# Patient Record
Sex: Female | Born: 1955 | Race: White | Hispanic: No | Marital: Married | Smoking: Never smoker
Health system: Southern US, Community
[De-identification: ages and names within clinical notes are randomized; demographics above are authoritative.]

## PROBLEM LIST (undated history)

## (undated) DIAGNOSIS — F321 Major depressive disorder, single episode, moderate: Secondary | ICD-10-CM

## (undated) DIAGNOSIS — K219 Gastro-esophageal reflux disease without esophagitis: Secondary | ICD-10-CM

## (undated) DIAGNOSIS — E041 Nontoxic single thyroid nodule: Secondary | ICD-10-CM

## (undated) DIAGNOSIS — J4521 Mild intermittent asthma with (acute) exacerbation: Secondary | ICD-10-CM

## (undated) HISTORY — DX: Mild intermittent asthma with (acute) exacerbation: J45.21

## (undated) HISTORY — DX: Nontoxic single thyroid nodule: E04.1

## (undated) HISTORY — PX: THYROIDECTOMY: SHX17

## (undated) HISTORY — DX: Major depressive disorder, single episode, moderate: F32.1

## (undated) HISTORY — DX: Gastro-esophageal reflux disease without esophagitis: K21.9

---

## 2016-11-04 DIAGNOSIS — D518 Other vitamin B12 deficiency anemias: Secondary | ICD-10-CM | POA: Diagnosis not present

## 2016-11-04 DIAGNOSIS — I10 Essential (primary) hypertension: Secondary | ICD-10-CM | POA: Diagnosis not present

## 2016-11-04 DIAGNOSIS — Z0000 Encounter for general adult medical examination without abnormal findings: Secondary | ICD-10-CM | POA: Diagnosis not present

## 2016-11-04 DIAGNOSIS — E119 Type 2 diabetes mellitus without complications: Secondary | ICD-10-CM | POA: Diagnosis not present

## 2016-11-04 DIAGNOSIS — E782 Mixed hyperlipidemia: Secondary | ICD-10-CM | POA: Diagnosis not present

## 2016-11-15 DIAGNOSIS — D518 Other vitamin B12 deficiency anemias: Secondary | ICD-10-CM | POA: Diagnosis not present

## 2016-11-15 DIAGNOSIS — J069 Acute upper respiratory infection, unspecified: Secondary | ICD-10-CM | POA: Diagnosis not present

## 2016-11-15 DIAGNOSIS — E559 Vitamin D deficiency, unspecified: Secondary | ICD-10-CM | POA: Diagnosis not present

## 2016-11-19 DIAGNOSIS — R05 Cough: Secondary | ICD-10-CM | POA: Diagnosis not present

## 2016-11-19 DIAGNOSIS — R0602 Shortness of breath: Secondary | ICD-10-CM | POA: Diagnosis not present

## 2016-11-26 DIAGNOSIS — J45991 Cough variant asthma: Secondary | ICD-10-CM | POA: Diagnosis not present

## 2016-11-26 DIAGNOSIS — R0989 Other specified symptoms and signs involving the circulatory and respiratory systems: Secondary | ICD-10-CM | POA: Diagnosis not present

## 2016-11-26 DIAGNOSIS — I484 Atypical atrial flutter: Secondary | ICD-10-CM | POA: Diagnosis not present

## 2016-11-26 DIAGNOSIS — J302 Other seasonal allergic rhinitis: Secondary | ICD-10-CM | POA: Diagnosis not present

## 2016-11-26 DIAGNOSIS — Z124 Encounter for screening for malignant neoplasm of cervix: Secondary | ICD-10-CM | POA: Diagnosis not present

## 2016-11-26 DIAGNOSIS — Z01419 Encounter for gynecological examination (general) (routine) without abnormal findings: Secondary | ICD-10-CM | POA: Diagnosis not present

## 2016-11-26 DIAGNOSIS — R221 Localized swelling, mass and lump, neck: Secondary | ICD-10-CM | POA: Diagnosis not present

## 2016-12-03 DIAGNOSIS — Z1231 Encounter for screening mammogram for malignant neoplasm of breast: Secondary | ICD-10-CM | POA: Diagnosis not present

## 2016-12-12 DIAGNOSIS — N63 Unspecified lump in unspecified breast: Secondary | ICD-10-CM | POA: Diagnosis not present

## 2016-12-12 DIAGNOSIS — N6001 Solitary cyst of right breast: Secondary | ICD-10-CM | POA: Diagnosis not present

## 2016-12-17 DIAGNOSIS — E042 Nontoxic multinodular goiter: Secondary | ICD-10-CM | POA: Diagnosis not present

## 2016-12-17 DIAGNOSIS — R221 Localized swelling, mass and lump, neck: Secondary | ICD-10-CM | POA: Diagnosis not present

## 2016-12-17 DIAGNOSIS — J45998 Other asthma: Secondary | ICD-10-CM | POA: Diagnosis not present

## 2016-12-17 DIAGNOSIS — E041 Nontoxic single thyroid nodule: Secondary | ICD-10-CM | POA: Diagnosis not present

## 2016-12-31 DIAGNOSIS — K219 Gastro-esophageal reflux disease without esophagitis: Secondary | ICD-10-CM | POA: Diagnosis not present

## 2016-12-31 DIAGNOSIS — E041 Nontoxic single thyroid nodule: Secondary | ICD-10-CM | POA: Diagnosis not present

## 2016-12-31 DIAGNOSIS — J45998 Other asthma: Secondary | ICD-10-CM | POA: Diagnosis not present

## 2017-01-01 DIAGNOSIS — M545 Low back pain: Secondary | ICD-10-CM | POA: Diagnosis not present

## 2017-01-15 DIAGNOSIS — Z01812 Encounter for preprocedural laboratory examination: Secondary | ICD-10-CM | POA: Diagnosis not present

## 2017-01-20 DIAGNOSIS — E041 Nontoxic single thyroid nodule: Secondary | ICD-10-CM | POA: Diagnosis not present

## 2017-01-20 DIAGNOSIS — E042 Nontoxic multinodular goiter: Secondary | ICD-10-CM | POA: Diagnosis not present

## 2017-01-20 DIAGNOSIS — Z79899 Other long term (current) drug therapy: Secondary | ICD-10-CM | POA: Diagnosis not present

## 2017-02-10 DIAGNOSIS — R8299 Other abnormal findings in urine: Secondary | ICD-10-CM | POA: Diagnosis not present

## 2017-02-10 DIAGNOSIS — R300 Dysuria: Secondary | ICD-10-CM | POA: Diagnosis not present

## 2017-02-10 DIAGNOSIS — N390 Urinary tract infection, site not specified: Secondary | ICD-10-CM | POA: Diagnosis not present

## 2017-02-10 DIAGNOSIS — J302 Other seasonal allergic rhinitis: Secondary | ICD-10-CM | POA: Diagnosis not present

## 2017-02-10 DIAGNOSIS — J45991 Cough variant asthma: Secondary | ICD-10-CM | POA: Diagnosis not present

## 2017-02-10 DIAGNOSIS — I484 Atypical atrial flutter: Secondary | ICD-10-CM | POA: Diagnosis not present

## 2017-04-25 DIAGNOSIS — E782 Mixed hyperlipidemia: Secondary | ICD-10-CM | POA: Diagnosis not present

## 2017-04-25 DIAGNOSIS — E119 Type 2 diabetes mellitus without complications: Secondary | ICD-10-CM | POA: Diagnosis not present

## 2017-04-25 DIAGNOSIS — I484 Atypical atrial flutter: Secondary | ICD-10-CM | POA: Diagnosis not present

## 2017-04-25 DIAGNOSIS — I10 Essential (primary) hypertension: Secondary | ICD-10-CM | POA: Diagnosis not present

## 2017-04-25 DIAGNOSIS — J302 Other seasonal allergic rhinitis: Secondary | ICD-10-CM | POA: Diagnosis not present

## 2017-04-25 DIAGNOSIS — J45991 Cough variant asthma: Secondary | ICD-10-CM | POA: Diagnosis not present

## 2017-04-25 DIAGNOSIS — D518 Other vitamin B12 deficiency anemias: Secondary | ICD-10-CM | POA: Diagnosis not present

## 2017-07-31 DIAGNOSIS — R221 Localized swelling, mass and lump, neck: Secondary | ICD-10-CM | POA: Diagnosis not present

## 2017-09-02 DIAGNOSIS — Z23 Encounter for immunization: Secondary | ICD-10-CM | POA: Diagnosis not present

## 2017-09-02 DIAGNOSIS — E041 Nontoxic single thyroid nodule: Secondary | ICD-10-CM | POA: Diagnosis not present

## 2017-09-02 DIAGNOSIS — K219 Gastro-esophageal reflux disease without esophagitis: Secondary | ICD-10-CM | POA: Diagnosis not present

## 2017-09-16 DIAGNOSIS — J4521 Mild intermittent asthma with (acute) exacerbation: Secondary | ICD-10-CM | POA: Diagnosis not present

## 2017-09-16 DIAGNOSIS — J208 Acute bronchitis due to other specified organisms: Secondary | ICD-10-CM | POA: Diagnosis not present

## 2017-10-28 HISTORY — PX: MENISCUS REPAIR: SHX5179

## 2018-03-15 DIAGNOSIS — S8991XA Unspecified injury of right lower leg, initial encounter: Secondary | ICD-10-CM | POA: Diagnosis not present

## 2018-03-17 DIAGNOSIS — M25561 Pain in right knee: Secondary | ICD-10-CM | POA: Diagnosis not present

## 2018-04-03 DIAGNOSIS — M25561 Pain in right knee: Secondary | ICD-10-CM | POA: Diagnosis not present

## 2018-04-14 DIAGNOSIS — M25461 Effusion, right knee: Secondary | ICD-10-CM | POA: Diagnosis not present

## 2018-04-14 DIAGNOSIS — M25561 Pain in right knee: Secondary | ICD-10-CM | POA: Diagnosis not present

## 2018-04-16 DIAGNOSIS — Y9301 Activity, walking, marching and hiking: Secondary | ICD-10-CM | POA: Diagnosis not present

## 2018-04-16 DIAGNOSIS — M25561 Pain in right knee: Secondary | ICD-10-CM | POA: Diagnosis not present

## 2018-04-16 DIAGNOSIS — S83231A Complex tear of medial meniscus, current injury, right knee, initial encounter: Secondary | ICD-10-CM | POA: Diagnosis not present

## 2018-04-20 DIAGNOSIS — M25561 Pain in right knee: Secondary | ICD-10-CM | POA: Diagnosis not present

## 2018-04-20 DIAGNOSIS — M23321 Other meniscus derangements, posterior horn of medial meniscus, right knee: Secondary | ICD-10-CM | POA: Diagnosis not present

## 2018-04-20 DIAGNOSIS — M25461 Effusion, right knee: Secondary | ICD-10-CM | POA: Diagnosis not present

## 2018-08-10 DIAGNOSIS — M25561 Pain in right knee: Secondary | ICD-10-CM | POA: Diagnosis not present

## 2018-09-14 DIAGNOSIS — M23321 Other meniscus derangements, posterior horn of medial meniscus, right knee: Secondary | ICD-10-CM | POA: Diagnosis not present

## 2018-09-14 DIAGNOSIS — M25561 Pain in right knee: Secondary | ICD-10-CM | POA: Diagnosis not present

## 2018-09-14 DIAGNOSIS — M25461 Effusion, right knee: Secondary | ICD-10-CM | POA: Diagnosis not present

## 2018-09-21 DIAGNOSIS — K219 Gastro-esophageal reflux disease without esophagitis: Secondary | ICD-10-CM | POA: Diagnosis not present

## 2018-09-21 DIAGNOSIS — M25561 Pain in right knee: Secondary | ICD-10-CM | POA: Diagnosis not present

## 2018-10-02 DIAGNOSIS — M6588 Other synovitis and tenosynovitis, other site: Secondary | ICD-10-CM | POA: Diagnosis not present

## 2018-10-02 DIAGNOSIS — M1711 Unilateral primary osteoarthritis, right knee: Secondary | ICD-10-CM | POA: Diagnosis not present

## 2018-10-02 DIAGNOSIS — S83231A Complex tear of medial meniscus, current injury, right knee, initial encounter: Secondary | ICD-10-CM | POA: Diagnosis not present

## 2018-10-02 DIAGNOSIS — M23321 Other meniscus derangements, posterior horn of medial meniscus, right knee: Secondary | ICD-10-CM | POA: Diagnosis not present

## 2018-10-02 DIAGNOSIS — S83271A Complex tear of lateral meniscus, current injury, right knee, initial encounter: Secondary | ICD-10-CM | POA: Diagnosis not present

## 2018-10-02 DIAGNOSIS — M23351 Other meniscus derangements, posterior horn of lateral meniscus, right knee: Secondary | ICD-10-CM | POA: Diagnosis not present

## 2018-10-02 DIAGNOSIS — M65861 Other synovitis and tenosynovitis, right lower leg: Secondary | ICD-10-CM | POA: Diagnosis not present

## 2018-10-05 DIAGNOSIS — R2689 Other abnormalities of gait and mobility: Secondary | ICD-10-CM | POA: Diagnosis not present

## 2018-10-05 DIAGNOSIS — M25561 Pain in right knee: Secondary | ICD-10-CM | POA: Diagnosis not present

## 2018-10-05 DIAGNOSIS — M25661 Stiffness of right knee, not elsewhere classified: Secondary | ICD-10-CM | POA: Diagnosis not present

## 2018-12-16 DIAGNOSIS — J060 Acute laryngopharyngitis: Secondary | ICD-10-CM | POA: Diagnosis not present

## 2020-01-04 ENCOUNTER — Telehealth (INDEPENDENT_AMBULATORY_CARE_PROVIDER_SITE_OTHER): Payer: Medicare HMO | Admitting: Nurse Practitioner

## 2020-01-04 ENCOUNTER — Encounter: Payer: Self-pay | Admitting: Nurse Practitioner

## 2020-01-04 VITALS — Temp 98.6°F | Ht 60.0 in | Wt 153.0 lb

## 2020-01-04 DIAGNOSIS — J321 Chronic frontal sinusitis: Secondary | ICD-10-CM | POA: Insufficient documentation

## 2020-01-04 DIAGNOSIS — J4521 Mild intermittent asthma with (acute) exacerbation: Secondary | ICD-10-CM

## 2020-01-04 HISTORY — DX: Chronic frontal sinusitis: J32.1

## 2020-01-04 MED ORDER — ALBUTEROL SULFATE HFA 108 (90 BASE) MCG/ACT IN AERS: 2.0000 | INHALATION_SPRAY | RESPIRATORY_TRACT | 2 refills | Status: DC | PRN

## 2020-01-04 MED ORDER — FLUTICASONE PROPIONATE 50 MCG/ACT NA SUSP: 2.0000 | Freq: Every day | NASAL | 3 refills | Status: DC

## 2020-01-04 MED ORDER — AMOXICILLIN-POT CLAVULANATE 875-125 MG PO TABS: 1.0000 | ORAL_TABLET | Freq: Two times a day (BID) | ORAL | 0 refills | Status: DC

## 2020-01-04 NOTE — Patient Instructions (Addendum)
Chronic frontal sinusitis Augmentin started for recurrent sinusitis, educated patient to increase fluid, rest, decongestant. Tylenol or motrin for pain. Medication and other refill sent to pharmacy. patient knows to follow up with worsening symptoms  Continue to work on eating a healthy diet and exercise.     Sinusitis, Adult Sinusitis is soreness and swelling (inflammation) of your sinuses. Sinuses are hollow spaces in the bones around your face. They are located:  Around your eyes.  In the middle of your forehead.  Behind your nose.  In your cheekbones. Your sinuses and nasal passages are lined with a fluid called mucus. Mucus drains out of your sinuses. Swelling can trap mucus in your sinuses. This lets germs (bacteria, virus, or fungus) grow, which leads to infection. Most of the time, this condition is caused by a virus. What are the causes? This condition is caused by:  Allergies.  Asthma.  Germs.  Things that block your nose or sinuses.  Growths in the nose (nasal polyps).  Chemicals or irritants in the air.  Fungus (rare). What increases the risk? You are more likely to develop this condition if:  You have a weak body defense system (immune system).  You do a lot of swimming or diving.  You use nasal sprays too much.  You smoke. What are the signs or symptoms? The main symptoms of this condition are pain and a feeling of pressure around the sinuses. Other symptoms include:  Stuffy nose (congestion).  Runny nose (drainage).  Swelling and warmth in the sinuses.  Headache.  Toothache.  A cough that may get worse at night.  Mucus that collects in the throat or the back of the nose (postnasal drip).  Being unable to smell and taste.  Being very tired (fatigue).  A fever.  Sore throat.  Bad breath. How is this diagnosed? This condition is diagnosed based on:  Your symptoms.  Your medical history.  A physical exam.  Tests to find out if  your condition is short-term (acute) or long-term (chronic). Your doctor may: ? Check your nose for growths (polyps). ? Check your sinuses using a tool that has a light (endoscope). ? Check for allergies or germs. ? Do imaging tests, such as an MRI or CT scan. How is this treated? Treatment for this condition depends on the cause and whether it is short-term or long-term.  If caused by a virus, your symptoms should go away on their own within 10 days. You may be given medicines to relieve symptoms. They include: ? Medicines that shrink swollen tissue in the nose. ? Medicines that treat allergies (antihistamines). ? A spray that treats swelling of the nostrils. ? Rinses that help get rid of thick mucus in your nose (nasal saline washes).  If caused by bacteria, your doctor may wait to see if you will get better without treatment. You may be given antibiotic medicine if you have: ? A very bad infection. ? A weak body defense system.  If caused by growths in the nose, you may need to have surgery. Follow these instructions at home: Medicines  Take, use, or apply over-the-counter and prescription medicines only as told by your doctor. These may include nasal sprays.  If you were prescribed an antibiotic medicine, take it as told by your doctor. Do not stop taking the antibiotic even if you start to feel better. Hydrate and humidify   Drink enough water to keep your pee (urine) pale yellow.  Use a cool mist humidifier to  keep the humidity level in your home above 50%.  Breathe in steam for 10-15 minutes, 3-4 times a day, or as told by your doctor. You can do this in the bathroom while a hot shower is running.  Try not to spend time in cool or dry air. Rest  Rest as much as you can.  Sleep with your head raised (elevated).  Make sure you get enough sleep each night. General instructions   Put a warm, moist washcloth on your face 3-4 times a day, or as often as told by your  doctor. This will help with discomfort.  Wash your hands often with soap and water. If there is no soap and water, use hand sanitizer.  Do not smoke. Avoid being around people who are smoking (secondhand smoke).  Keep all follow-up visits as told by your doctor. This is important. Contact a doctor if:  You have a fever.  Your symptoms get worse.  Your symptoms do not get better within 10 days. Get help right away if:  You have a very bad headache.  You cannot stop throwing up (vomiting).  You have very bad pain or swelling around your face or eyes.  You have trouble seeing.  You feel confused.  Your neck is stiff.  You have trouble breathing. Summary  Sinusitis is swelling of your sinuses. Sinuses are hollow spaces in the bones around your face.  This condition is caused by tissues in your nose that become inflamed or swollen. This traps germs. These can lead to infection.  If you were prescribed an antibiotic medicine, take it as told by your doctor. Do not stop taking it even if you start to feel better.  Keep all follow-up visits as told by your doctor. This is important. This information is not intended to replace advice given to you by your health care provider. Make sure you discuss any questions you have with your health care provider. Document Revised: 03/16/2018 Document Reviewed: 03/16/2018 Elsevier Patient Education  2020 [employer].

## 2020-01-04 NOTE — Progress Notes (Signed)
TELEHEALTH VIRTUAL  VISIT ENCOUNTER NOTE  I connected with Brittany Short on 01/04/20 at  9:00 AM EST by telephone at home and verified that I am speaking with the correct person using two identifiers.   I discussed the limitations, risks, security and privacy concerns of performing an evaluation and management service by telephone and the availability of in person appointments. I also discussed with the patient that there may be a patient responsible charge related to this service. The patient expressed understanding and agreed to proceed.     Patient  is a 64 year old female. She is  Being evaluated for Sinusitis.  The patient's medications were reviewed and reconciled.  History details were provided by the patient. The history appears to be reliable.     History:  Brittany Short is a 64 y.o. female. Patient presents with recurrent sinusitis. reports chronic sinus infections for about 7 days.  Her symptoms include head and chest congestion, sinus pain and sinus congestion, facial pain, cough with brown to clear sputum, sneezing and watery eyes. There has HGD:JMEQ a history of chronic otitis media or pharyngotonsillitis. Patient has a history of Asthma. Prior treatment includes over the counter Flonase, decongestant and tylenol with only mild relief.       Past Medical History:  Diagnosis Date  . GERD (gastroesophageal reflux disease)   . Mild intermittent asthma with (acute) exacerbation   . Nontoxic single thyroid nodule    Past Surgical History:  Procedure Laterality Date  . CESAREAN SECTION  1982 & 1998   X2  . THYROIDECTOMY Right    The following portions of the patient's history were reviewed and updated as appropriate: allergies, current medications, past family history, past medical history, past social history, past surgical history and problem list.    Review of Systems:  Review of Systems  Constitutional: Negative for chills and fever.  HENT: Positive for congestion and  sinus pain. Negative for ear discharge, ear pain and sore throat.   Eyes: Negative for blurred vision, double vision, pain and discharge.  Respiratory: Positive for cough and sputum production. Negative for hemoptysis and shortness of breath.   Cardiovascular: Negative for chest pain and palpitations.  Gastrointestinal: Negative for abdominal pain, constipation, nausea and vomiting.  Genitourinary: Negative for dysuria.  Musculoskeletal: Negative for myalgias.  Skin: Negative for rash.  Neurological: Positive for headaches. Negative for speech change and weakness.    Physical Exam:   General:  Alert, oriented and cooperative.   Mental Status: Normal mood and affect perceived. Normal judgment and thought content.  Physical exam deferred due to nature of the encounter   Assessment and Plan:    Chronic frontal sinusitis Augmentin started for recurrent sinusitis, educated patient to increase fluid, rest, decongestant. Tylenol or motrin for pain. Medication and other refill sent to pharmacy. patient knows to follow up with worsening symptoms  Continue to work on eating a healthy diet and exercise.       I discussed the assessment and treatment plan with the patient. The patient was provided an opportunity to ask questions and all were answered. The patient agreed with the plan and demonstrated an understanding of the instructions.   The patient was advised to call back or seek an in-person evaluation/go to the ED if the symptoms worsen or if the condition fails to improve as anticipated.  I provided 10 minutes of non-face-to-face time during this encounter.   Daryll Drown, NP

## 2020-01-04 NOTE — Assessment & Plan Note (Signed)
Augmentin started for recurrent sinusitis, educated patient to increase fluid, rest, decongestant. Tylenol or motrin for pain. Medication and other refill sent to pharmacy. patient knows to follow up with worsening symptoms  Continue to work on eating a healthy diet and exercise.

## 2020-01-17 DIAGNOSIS — Z1231 Encounter for screening mammogram for malignant neoplasm of breast: Secondary | ICD-10-CM | POA: Diagnosis not present

## 2020-01-18 ENCOUNTER — Other Ambulatory Visit: Payer: Self-pay

## 2020-01-18 ENCOUNTER — Encounter: Payer: Self-pay | Admitting: Physician Assistant

## 2020-03-06 ENCOUNTER — Encounter: Payer: Self-pay | Admitting: Legal Medicine

## 2020-03-06 ENCOUNTER — Ambulatory Visit (INDEPENDENT_AMBULATORY_CARE_PROVIDER_SITE_OTHER): Payer: Medicare HMO | Admitting: Legal Medicine

## 2020-03-06 ENCOUNTER — Other Ambulatory Visit: Payer: Self-pay

## 2020-03-06 VITALS — BP 140/60 | HR 90 | Temp 97.2°F | Resp 18 | Ht 60.0 in | Wt 155.0 lb

## 2020-03-06 DIAGNOSIS — N3001 Acute cystitis with hematuria: Secondary | ICD-10-CM | POA: Diagnosis not present

## 2020-03-06 DIAGNOSIS — E041 Nontoxic single thyroid nodule: Secondary | ICD-10-CM | POA: Insufficient documentation

## 2020-03-06 DIAGNOSIS — K219 Gastro-esophageal reflux disease without esophagitis: Secondary | ICD-10-CM | POA: Insufficient documentation

## 2020-03-06 DIAGNOSIS — F321 Major depressive disorder, single episode, moderate: Secondary | ICD-10-CM | POA: Insufficient documentation

## 2020-03-06 DIAGNOSIS — N390 Urinary tract infection, site not specified: Secondary | ICD-10-CM | POA: Insufficient documentation

## 2020-03-06 DIAGNOSIS — J4521 Mild intermittent asthma with (acute) exacerbation: Secondary | ICD-10-CM | POA: Insufficient documentation

## 2020-03-06 LAB — POCT URINALYSIS DIPSTICK
Glucose, UA: NEGATIVE
Ketones, UA: NEGATIVE
Nitrite, UA: POSITIVE
Protein, UA: POSITIVE — AB
Spec Grav, UA: 1.02 (ref 1.010–1.025)
Urobilinogen, UA: 1 E.U./dL
pH, UA: 6 (ref 5.0–8.0)

## 2020-03-06 MED ORDER — SULFAMETHOXAZOLE-TRIMETHOPRIM 800-160 MG PO TABS: 1.0000 | ORAL_TABLET | Freq: Two times a day (BID) | ORAL | 0 refills | Status: DC

## 2020-03-06 NOTE — Progress Notes (Signed)
Established Patient Office Visit  Subjective:  [name], female    DOB: Mar 20, 1956  Age: 64 y.o. MRN: [id number]  CC:  Chief Complaint  Patient presents with  . Urinary Tract Infection    HPI Brittany Short presents for cystitis.  Symptoms started Saturday with dysuria and frequency. No fever or chills.  Last uti in January.  Past Medical History:  Diagnosis Date  . GERD (gastroesophageal reflux disease)   . Major depressive disorder, single episode, moderate ([place])   . Mild intermittent asthma with (acute) exacerbation   . Nontoxic single thyroid nodule     Past Surgical History:  Procedure Laterality Date  . [place]  . THYROIDECTOMY Right     Family History  Problem Relation Age of Onset  . Lung cancer Mother   . COPD Mother   . Heart attack Father   . Depression Sister   . Suicidality Sister   . Lung cancer Brother   . COPD Brother     Social History   Socioeconomic History  . Marital status: Married    Spouse name: Not on file  . Number of children: 2  . Years of education: Not on file  . Highest education level: Not on file  Occupational History  . Not on file  Tobacco Use  . Smoking status: Never Smoker  . Smokeless tobacco: Never Used  Substance and Sexual Activity  . Alcohol use: Not Currently  . Drug use: Never  . Sexual activity: Not on file  Other Topics Concern  . Not on file  Social History Narrative  . Not on file   Social Determinants of Health   Financial Resource Strain:   . Difficulty of Paying Living Expenses:   Food Insecurity:   . Worried About Charity fundraiser in the Last Year:   . Arboriculturist in the Last Year:   Transportation Needs:   . Film/video editor (Medical):   Marland Kitchen Lack of Transportation (Non-Medical):   Physical Activity:   . Days of Exercise per Week:   . Minutes of Exercise per Session:   Stress:   . Feeling of Stress :   Social Connections:   .  Frequency of Communication with Friends and Family:   . Frequency of Social Gatherings with Friends and Family:   . Attends Religious Services:   . Active Member of Clubs or Organizations:   . Attends Archivist Meetings:   Marland Kitchen Marital Status:   Intimate Partner Violence:   . Fear of Current or Ex-Partner:   . Emotionally Abused:   Marland Kitchen Physically Abused:   . Sexually Abused:     Outpatient Medications Prior to Visit  Medication Sig Dispense Refill  . albuterol (VENTOLIN HFA) 108 (90 Base) MCG/ACT inhaler Inhale 2 puffs into the lungs every 4 (four) hours as needed for wheezing or shortness of breath. 18 g 2  . celecoxib (CELEBREX) 200 MG capsule Take 200 mg by mouth daily.    . Cholecalciferol 50 MCG (2000 UT) CAPS Take by mouth.    . fluticasone (FLONASE) 50 MCG/ACT nasal spray Place 2 sprays into both nostrils daily. 16 g 3  . montelukast (SINGULAIR) 10 MG tablet Take 10 mg by mouth daily.    . pantoprazole (PROTONIX) 40 MG tablet Take 40 mg by mouth daily.    Marland Kitchen venlafaxine XR (EFFEXOR-XR) 37.5 MG 24 hr capsule Take 37.5 mg by mouth  daily.    . amoxicillin-clavulanate (AUGMENTIN) 875-125 MG tablet Take 1 tablet by mouth 2 (two) times daily. 20 tablet 0   No facility-administered medications prior to visit.    No Known Allergies  ROS Review of Systems  Constitutional: Negative.   HENT: Negative.   Eyes: Negative.   Respiratory: Negative.   Cardiovascular: Negative.   Gastrointestinal: Negative.   Endocrine: Negative.   Genitourinary: Positive for dysuria.  Musculoskeletal: Negative.   Skin: Negative.   Neurological: Negative.       Objective:    Physical Exam  Constitutional: She appears well-developed and well-nourished.  HENT:  Head: Atraumatic.  Cardiovascular: Normal rate, regular rhythm, normal heart sounds and intact distal pulses.  Pulmonary/Chest: Effort normal and breath sounds normal.  Vitals reviewed.   BP 140/60   Pulse 90   Temp (!) 97.2  F (36.2 C)   Resp 18   Ht 5' (1.524 m)   Wt 155 lb (70.3 kg)   SpO2 95%   BMI 30.27 kg/m  Wt Readings from Last 3 Encounters:  03/06/20 155 lb (70.3 kg)  01/04/20 153 lb (69.4 kg)     Health Maintenance Due  Topic Date Due  . Hepatitis C Screening  Never done  . HIV Screening  Never done  . COVID-19 Vaccine (1) Never done  . TETANUS/TDAP  Never done  . PAP SMEAR-Modifier  Never done  . COLONOSCOPY  Never done  . DEXA SCAN  Never done  . PNA vac Low Risk Adult (1 of 2 - PCV13) Never done    There are no preventive care reminders to display for this patient.  No results found for: TSH No results found for: WBC, HGB, HCT, MCV, PLT No results found for: NA, K, CHLORIDE, CO2, GLUCOSE, BUN, CREATININE, BILITOT, ALKPHOS, AST, ALT, PROT, ALBUMIN, CALCIUM, ANIONGAP, EGFR, GFR No results found for: CHOL No results found for: HDL No results found for: LDLCALC No results found for: TRIG No results found for: CHOLHDL No results found for: HGBA1C    Assessment & Plan:   Problem List Items Addressed This Visit      Genitourinary   UTI (urinary tract infection) - Primary    Patient doing well.  We will culture and treat      Relevant Medications   sulfamethoxazole-trimethoprim (BACTRIM DS) 800-160 MG tablet   Other Relevant Orders   POCT urinalysis dipstick (Completed)   Urine Culture      Meds ordered this encounter  Medications  . sulfamethoxazole-trimethoprim (BACTRIM DS) 800-160 MG tablet    Sig: Take 1 tablet by mouth 2 (two) times daily.    Dispense:  14 tablet    Refill:  0    Follow-up: Return urine ua in 2 weeks.    Reinaldo Meeker, MD

## 2020-03-06 NOTE — Assessment & Plan Note (Signed)
Patient doing well.  We will culture and treat

## 2020-03-09 ENCOUNTER — Other Ambulatory Visit: Payer: Self-pay | Admitting: Legal Medicine

## 2020-03-09 DIAGNOSIS — N3001 Acute cystitis with hematuria: Secondary | ICD-10-CM

## 2020-03-09 LAB — URINE CULTURE

## 2020-03-09 MED ORDER — CEPHALEXIN 500 MG PO CAPS: 500.0000 mg | ORAL_CAPSULE | Freq: Two times a day (BID) | ORAL | 0 refills | Status: DC

## 2020-03-09 NOTE — Progress Notes (Signed)
Urine culture resistant to sulfa drugs, change to keflex 500mg  bid lp

## 2020-03-20 ENCOUNTER — Other Ambulatory Visit: Payer: Self-pay

## 2020-03-20 ENCOUNTER — Ambulatory Visit (INDEPENDENT_AMBULATORY_CARE_PROVIDER_SITE_OTHER): Payer: Medicare HMO

## 2020-03-20 DIAGNOSIS — N3001 Acute cystitis with hematuria: Secondary | ICD-10-CM | POA: Diagnosis not present

## 2020-03-20 LAB — POCT URINALYSIS DIP (CLINITEK)
Bilirubin, UA: NEGATIVE
Blood, UA: NEGATIVE
Glucose, UA: NEGATIVE mg/dL
Ketones, POC UA: NEGATIVE mg/dL
Leukocytes, UA: NEGATIVE
Nitrite, UA: NEGATIVE
POC PROTEIN,UA: NEGATIVE
Spec Grav, UA: 1.01 (ref 1.010–1.025)
Urobilinogen, UA: 0.2 E.U./dL
pH, UA: 6.5 (ref 5.0–8.0)

## 2020-03-20 NOTE — Progress Notes (Signed)
      Patient came in today to have a repeat urinalysis.  She has a couple of doses of her ABT left which she will continue and compete.  She denies any symptoms.  Results for orders placed or performed in visit on 03/20/20 (from the past 24 hour(s))  POCT URINALYSIS DIP (CLINITEK)     Status: Normal   Collection Time: 03/20/20  9:00 AM  Result Value Ref Range   Color, UA yellow yellow   Clarity, UA clear clear   Glucose, UA negative negative mg/dL   Bilirubin, UA negative negative   Ketones, POC UA negative negative mg/dL   Spec Grav, UA 4.332 9.518 - 1.025   Blood, UA negative negative   pH, UA 6.5 5.0 - 8.0   POC PROTEIN,UA negative negative, trace   Urobilinogen, UA 0.2 0.2 or 1.0 E.U./dL   Nitrite, UA Negative Negative   Leukocytes, UA Negative Negative    Creola Corn, LPN 84/16/60 6:30 AM

## 2020-03-20 NOTE — Progress Notes (Signed)
Urinalysis normal lp

## 2020-03-28 DIAGNOSIS — H5203 Hypermetropia, bilateral: Secondary | ICD-10-CM | POA: Diagnosis not present

## 2020-03-28 DIAGNOSIS — H52209 Unspecified astigmatism, unspecified eye: Secondary | ICD-10-CM | POA: Diagnosis not present

## 2020-03-28 DIAGNOSIS — H524 Presbyopia: Secondary | ICD-10-CM | POA: Diagnosis not present

## 2020-07-26 ENCOUNTER — Ambulatory Visit: Payer: Medicare HMO | Admitting: Physician Assistant

## 2020-08-02 ENCOUNTER — Encounter: Payer: Self-pay | Admitting: Physician Assistant

## 2020-08-02 ENCOUNTER — Other Ambulatory Visit: Payer: Self-pay

## 2020-08-02 ENCOUNTER — Ambulatory Visit (INDEPENDENT_AMBULATORY_CARE_PROVIDER_SITE_OTHER): Payer: Medicare HMO | Admitting: Physician Assistant

## 2020-08-02 VITALS — BP 128/62 | HR 66 | Temp 97.4°F | Ht 60.0 in | Wt 154.2 lb

## 2020-08-02 DIAGNOSIS — R5383 Other fatigue: Secondary | ICD-10-CM | POA: Diagnosis not present

## 2020-08-02 DIAGNOSIS — R799 Abnormal finding of blood chemistry, unspecified: Secondary | ICD-10-CM | POA: Diagnosis not present

## 2020-08-02 DIAGNOSIS — Z1211 Encounter for screening for malignant neoplasm of colon: Secondary | ICD-10-CM

## 2020-08-02 DIAGNOSIS — R002 Palpitations: Secondary | ICD-10-CM

## 2020-08-02 DIAGNOSIS — Z23 Encounter for immunization: Secondary | ICD-10-CM | POA: Diagnosis not present

## 2020-08-02 DIAGNOSIS — E041 Nontoxic single thyroid nodule: Secondary | ICD-10-CM | POA: Diagnosis not present

## 2020-08-02 HISTORY — DX: Encounter for immunization: Z23

## 2020-08-02 HISTORY — DX: Palpitations: R00.2

## 2020-08-02 HISTORY — DX: Encounter for screening for malignant neoplasm of colon: Z12.11

## 2020-08-02 NOTE — Assessment & Plan Note (Signed)
EKG stable labwork pending Referral to cardiology

## 2020-08-02 NOTE — Progress Notes (Addendum)
Established Patient Office Visit  Subjective:  [name], female    DOB: July 19, 1956  Age: 64 y.o. MRN: [id number]  CC:  Chief Complaint  Patient presents with  . Palpitations    HPI Brittany Short presents for palpitations  Pt states that she has intermittent problems with palpitations - the last episode being on 07/15/20 and the time before that 3 months prior.  States they usually only would last a few seconds but the last episode was about 20 minutes and her heart rate got up to 138 - she did not have chest pain or dyspnea but said she was exhausted and fatigued after the episode  Pt with history of anxiety - symptoms are stable on effexor xr 37.58m  Pt with history of GERD - symptoms stable on protonix 437mq  Pt with history of arthritis - symptoms stable on celebrex 20063md  Past Medical History:  Diagnosis Date  . GERD (gastroesophageal reflux disease)   . Major depressive disorder, single episode, moderate ([place] . Mild intermittent asthma with (acute) exacerbation   . Nontoxic single thyroid nodule     Past Surgical History:  Procedure Laterality Date  . [place] THYROIDECTOMY Right     Family History  Problem Relation Age of Onset  . Lung cancer Mother   . COPD Mother   . Heart attack Father   . Depression Sister   . Suicidality Sister   . Lung cancer Brother   . COPD Brother     Social History   Socioeconomic History  . Marital status: Married    Spouse name: Not on file  . Number of children: 2  . Years of education: Not on file  . Highest education level: Not on file  Occupational History  . Not on file  Tobacco Use  . Smoking status: Never Smoker  . Smokeless tobacco: Never Used  Vaping Use  . Vaping Use: Never used  Substance and Sexual Activity  . Alcohol use: Not Currently  . Drug use: Never  . Sexual activity: Not on file  Other Topics Concern  . Not on file  Social History Narrative   . Not on file   Social Determinants of Health   Financial Resource Strain:   . Difficulty of Paying Living Expenses: Not on file  Food Insecurity:   . Worried About RunCharity fundraiser the Last Year: Not on file  . Ran Out of Food in the Last Year: Not on file  Transportation Needs:   . Lack of Transportation (Medical): Not on file  . Lack of Transportation (Non-Medical): Not on file  Physical Activity:   . Days of Exercise per Week: Not on file  . Minutes of Exercise per Session: Not on file  Stress:   . Feeling of Stress : Not on file  Social Connections:   . Frequency of Communication with Friends and Family: Not on file  . Frequency of Social Gatherings with Friends and Family: Not on file  . Attends Religious Services: Not on file  . Active Member of Clubs or Organizations: Not on file  . Attends CluArchivistetings: Not on file  . Marital Status: Not on file  Intimate Partner Violence:   . Fear of Current or Ex-Partner: Not on file  . Emotionally Abused: Not on file  . Physically Abused: Not on file  . Sexually Abused: Not on  file     Current Outpatient Medications:  .  albuterol (VENTOLIN HFA) 108 (90 Base) MCG/ACT inhaler, Inhale 2 puffs into the lungs every 4 (four) hours as needed for wheezing or shortness of breath., Disp: 18 g, Rfl: 2 .  celecoxib (CELEBREX) 200 MG capsule, Take 200 mg by mouth daily., Disp: , Rfl:  .  Cholecalciferol 50 MCG (2000 UT) CAPS, Take by mouth., Disp: , Rfl:  .  fluticasone (FLONASE) 50 MCG/ACT nasal spray, Place 2 sprays into both nostrils daily., Disp: 16 g, Rfl: 3 .  montelukast (SINGULAIR) 10 MG tablet, Take 10 mg by mouth daily., Disp: , Rfl:  .  pantoprazole (PROTONIX) 40 MG tablet, Take 40 mg by mouth daily., Disp: , Rfl:  .  venlafaxine XR (EFFEXOR-XR) 37.5 MG 24 hr capsule, Take 37.5 mg by mouth daily., Disp: , Rfl:    No Known Allergies  ROS CONSTITUTIONAL: Negative for chills, fatigue, fever, unintentional  weight gain and unintentional weight loss.  E/N/T: Negative for ear pain, nasal congestion and sore throat.  CARDIOVASCULAR: Negative for chest pain, dizziness, palpitations and pedal edema.  RESPIRATORY: Negative for recent cough and dyspnea.  GASTROINTESTINAL: Negative for abdominal pain, acid reflux symptoms, constipation, diarrhea, nausea and vomiting.  MSK: Negative for arthralgias and myalgias.  INTEGUMENTARY: Negative for rash.  NEUROLOGICAL: Negative for dizziness and headaches.  PSYCHIATRIC: Negative for sleep disturbance and to question depression screen.  Negative for depression, negative for anhedonia.        Objective:    PHYSICAL EXAM:   VS: BP 128/62 (BP Location: Left Arm, Patient Position: Sitting, Cuff Size: Normal)   Pulse 66   Temp (!) 97.4 F (36.3 C) (Temporal)   Ht 5' (1.524 m)   Wt 154 lb 3.2 oz (69.9 kg)   SpO2 99%   BMI 30.12 kg/m   GEN: Well nourished, well developed, in no acute distress  HEENT: normal external ears and nose - normal external auditory canals and TMS - hearing grossly normal - normal nasal mucosa and septum - Lips, Teeth and Gums - normal  Oropharynx - normal mucosa, palate, and posterior pharynx Neck: no JVD or masses - no thyromegaly Cardiac: RRR; no murmurs, rubs, or gallops,no edema - no significant varicosities Respiratory:  normal respiratory rate and pattern with no distress - normal breath sounds with no rales, rhonchi, wheezes or rubs GI: normal bowel sounds, no masses or tenderness MS: no deformity or atrophy  Skin: warm and dry, no rash  Neuro:  Alert and Oriented x 3, Strength and sensation are intact - CN II-Xii grossly intact Psych: euthymic mood, appropriate affect and demeanor  BP 128/62 (BP Location: Left Arm, Patient Position: Sitting, Cuff Size: Normal)   Pulse 66   Temp (!) 97.4 F (36.3 C) (Temporal)   Ht 5' (1.524 m)   Wt 154 lb 3.2 oz (69.9 kg)   SpO2 99%   BMI 30.12 kg/m  Wt Readings from Last 3  Encounters:  08/02/20 154 lb 3.2 oz (69.9 kg)  03/06/20 155 lb (70.3 kg)  01/04/20 153 lb (69.4 kg)     Health Maintenance Due  Topic Date Due  . TETANUS/TDAP  Never done  . PAP SMEAR-Modifier  Never done    There are no preventive care reminders to display for this patient.  No results found for: TSH No results found for: WBC, HGB, HCT, MCV, PLT No results found for: NA, K, CHLORIDE, CO2, GLUCOSE, BUN, CREATININE, BILITOT, ALKPHOS, AST, ALT, PROT, ALBUMIN,  CALCIUM, ANIONGAP, EGFR, GFR No results found for: CHOL No results found for: HDL No results found for: LDLCALC No results found for: TRIG No results found for: CHOLHDL No results found for: HGBA1C    Assessment & Plan:   Problem List Items Addressed This Visit      Endocrine   Nontoxic single thyroid nodule    labwork pending      Relevant Orders   Thyroid Panel With TSH     Other   Colon cancer screening    cologuard scheduled      Relevant Orders   Ambulatory referral to Gastroenterology   Need for prophylactic vaccination against Streptococcus pneumoniae (pneumococcus)    prevnar 13 given      Relevant Orders   Pneumococcal conjugate vaccine 13-valent (Completed)   Need for prophylactic vaccination and inoculation against influenza    fluad given      Relevant Orders   Flu Vaccine QUAD High Dose(Fluad) (Completed)   Palpitations - Primary    EKG stable labwork pending Referral to cardiology      Relevant Orders   EKG 12-Lead   CBC with Differential/Platelet   Comprehensive metabolic panel   Thyroid Panel With TSH   Lipid panel   VITAMIN D 25 Hydroxy (Vit-D Deficiency, Fractures)   Ambulatory referral to Cardiology   RESOLVED: Abnormal blood chemistry      No orders of the defined types were placed in this encounter.  More than 30 minutes of time spent with patient for assessment/treatment Follow-up: Return in about 3 months (around 11/02/2020) for chronic fasting follow up.    SARA  R Laquashia Mergenthaler, PA-C

## 2020-08-02 NOTE — Assessment & Plan Note (Signed)
prevnar 13 given

## 2020-08-02 NOTE — Assessment & Plan Note (Signed)
labwork pending 

## 2020-08-02 NOTE — Assessment & Plan Note (Signed)
fluad given 

## 2020-08-02 NOTE — Assessment & Plan Note (Signed)
cologuard scheduled

## 2020-08-03 LAB — CBC WITH DIFFERENTIAL/PLATELET
Basophils Absolute: 0 10*3/uL (ref 0.0–0.2)
Basos: 0 %
EOS (ABSOLUTE): 0.1 10*3/uL (ref 0.0–0.4)
Eos: 2 %
Hematocrit: 41.8 % (ref 34.0–46.6)
Hemoglobin: 14.2 g/dL (ref 11.1–15.9)
Immature Grans (Abs): 0 10*3/uL (ref 0.0–0.1)
Immature Granulocytes: 0 %
Lymphocytes Absolute: 2.8 10*3/uL (ref 0.7–3.1)
Lymphs: 44 %
MCH: 30.7 pg (ref 26.6–33.0)
MCHC: 34 g/dL (ref 31.5–35.7)
MCV: 90 fL (ref 79–97)
Monocytes Absolute: 0.4 10*3/uL (ref 0.1–0.9)
Monocytes: 6 %
Neutrophils Absolute: 3 10*3/uL (ref 1.4–7.0)
Neutrophils: 48 %
Platelets: 223 10*3/uL (ref 150–450)
RBC: 4.63 x10E6/uL (ref 3.77–5.28)
RDW: 12.2 % (ref 11.7–15.4)
WBC: 6.2 10*3/uL (ref 3.4–10.8)

## 2020-08-03 LAB — THYROID PANEL WITH TSH
Free Thyroxine Index: 1.7 (ref 1.2–4.9)
T3 Uptake Ratio: 21 % — ABNORMAL LOW (ref 24–39)
T4, Total: 8.2 ug/dL (ref 4.5–12.0)
TSH: 1.91 u[IU]/mL (ref 0.450–4.500)

## 2020-08-03 LAB — LIPID PANEL
Chol/HDL Ratio: 3.8 ratio (ref 0.0–4.4)
Cholesterol, Total: 199 mg/dL (ref 100–199)
HDL: 53 mg/dL (ref >39)
LDL Chol Calc (NIH): 127 mg/dL — ABNORMAL HIGH (ref 0–99)
Triglycerides: 103 mg/dL (ref 0–149)
VLDL Cholesterol Cal: 19 mg/dL (ref 5–40)

## 2020-08-03 LAB — COMPREHENSIVE METABOLIC PANEL
ALT: 20 IU/L (ref 0–32)
AST: 21 IU/L (ref 0–40)
Albumin/Globulin Ratio: 1.8 (ref 1.2–2.2)
Albumin: 4.4 g/dL (ref 3.8–4.8)
Alkaline Phosphatase: 81 IU/L (ref 44–121)
BUN/Creatinine Ratio: 13 (ref 12–28)
BUN: 10 mg/dL (ref 8–27)
Bilirubin Total: 0.3 mg/dL (ref 0.0–1.2)
CO2: 27 mmol/L (ref 20–29)
Calcium: 9.4 mg/dL (ref 8.7–10.3)
Chloride: 104 mmol/L (ref 96–106)
Creatinine, Ser: 0.76 mg/dL (ref 0.57–1.00)
GFR calc Af Amer: 95 mL/min/{1.73_m2} (ref >59)
GFR calc non Af Amer: 83 mL/min/{1.73_m2} (ref >59)
Globulin, Total: 2.5 g/dL (ref 1.5–4.5)
Glucose: 121 mg/dL — ABNORMAL HIGH (ref 65–99)
Potassium: 4.8 mmol/L (ref 3.5–5.2)
Sodium: 143 mmol/L (ref 134–144)
Total Protein: 6.9 g/dL (ref 6.0–8.5)

## 2020-08-03 LAB — CARDIOVASCULAR RISK ASSESSMENT

## 2020-08-03 LAB — VITAMIN D 25 HYDROXY (VIT D DEFICIENCY, FRACTURES): Vit D, 25-Hydroxy: 32.2 ng/mL (ref 30.0–100.0)

## 2020-08-22 ENCOUNTER — Other Ambulatory Visit: Payer: Self-pay

## 2020-08-22 ENCOUNTER — Ambulatory Visit (INDEPENDENT_AMBULATORY_CARE_PROVIDER_SITE_OTHER): Payer: Medicare HMO

## 2020-08-22 ENCOUNTER — Ambulatory Visit: Payer: Medicare HMO | Admitting: Cardiology

## 2020-08-22 ENCOUNTER — Encounter: Payer: Self-pay | Admitting: Cardiology

## 2020-08-22 VITALS — BP 128/62 | HR 72 | Ht 60.0 in | Wt 154.4 lb

## 2020-08-22 DIAGNOSIS — R002 Palpitations: Secondary | ICD-10-CM

## 2020-08-22 NOTE — Progress Notes (Signed)
Cardiology Office Note:    Date:  08/22/2020   ID:  Brittany Short, DOB [birth year], MRN [id number]  PCP:  Marianne Sofia, PA-C  Cardiologist:  Thomasene Ripple, DO  Electrophysiologist:  None   Referring MD: Marianne Sofia, PA-C   I have been having some palpitations  History of Present Illness:    Brittany Short is a 64 y.o. female with a hx of GERD, major depressive disorder presents today to be evaluated for palpitations.  The patient tells me that her PCP sent her as over the last several weeks she has had intermittent palpitations.  She described as a fast heartbeat which goes for a few minutes and then resolved.  She has had more frequent episodes of this and she is concerned.  She absolutely denied any chest pain shortness of breath, any lightheadedness or dizziness.  She has not had any episodes of any syncope.  She denies any cardiovascular disease in her mother and father.  But tells me that her son was diagnosed with hypertrophic cardiomyopathy as a teenager he is now 37.  During the time of his diagnosis she or other family members were not screened.  Past Medical History:  Diagnosis Date  . GERD (gastroesophageal reflux disease)   . Major depressive disorder, single episode, moderate (HCC)   . Mild intermittent asthma with (acute) exacerbation   . Nontoxic single thyroid nodule     Past Surgical History:  Procedure Laterality Date  . CESAREAN SECTION  1982 & 1998   X2  . THYROIDECTOMY Right     Current Medications: Current Meds  Medication Sig  . albuterol (VENTOLIN HFA) 108 (90 Base) MCG/ACT inhaler Inhale 2 puffs into the lungs every 4 (four) hours as needed for wheezing or shortness of breath.  . celecoxib (CELEBREX) 200 MG capsule Take 200 mg by mouth daily.  . Cholecalciferol 50 MCG (2000 UT) CAPS Take by mouth.  . fluticasone (FLONASE) 50 MCG/ACT nasal spray Place 2 sprays into both nostrils daily.  . montelukast (SINGULAIR) 10 MG tablet Take 10 mg by mouth daily.  .  pantoprazole (PROTONIX) 40 MG tablet Take 40 mg by mouth daily.  Marland Kitchen venlafaxine XR (EFFEXOR-XR) 37.5 MG 24 hr capsule Take 37.5 mg by mouth daily.     Allergies:   Patient has no known allergies.   Social History   Socioeconomic History  . Marital status: Married    Spouse name: Not on file  . Number of children: 2  . Years of education: Not on file  . Highest education level: Not on file  Occupational History  . Not on file  Tobacco Use  . Smoking status: Never Smoker  . Smokeless tobacco: Never Used  Vaping Use  . Vaping Use: Never used  Substance and Sexual Activity  . Alcohol use: Not Currently  . Drug use: Never  . Sexual activity: Not on file  Other Topics Concern  . Not on file  Social History Narrative  . Not on file   Social Determinants of Health   Financial Resource Strain:   . Difficulty of Paying Living Expenses: Not on file  Food Insecurity:   . Worried About Programme researcher, broadcasting/film/video in the Last Year: Not on file  . Ran Out of Food in the Last Year: Not on file  Transportation Needs:   . Lack of Transportation (Medical): Not on file  . Lack of Transportation (Non-Medical): Not on file  Physical Activity:   . Days of Exercise per Week:  Not on file  . Minutes of Exercise per Session: Not on file  Stress:   . Feeling of Stress : Not on file  Social Connections:   . Frequency of Communication with Friends and Family: Not on file  . Frequency of Social Gatherings with Friends and Family: Not on file  . Attends Religious Services: Not on file  . Active Member of Clubs or Organizations: Not on file  . Attends Banker Meetings: Not on file  . Marital Status: Not on file     Family History: The patient's family history includes COPD in her brother and mother; Depression in her sister; Heart attack in her father; Lung cancer in her brother and mother; Suicidality in her sister.  ROS:   Review of Systems  Constitution: Negative for decreased  appetite, fever and weight gain.  HENT: Negative for congestion, ear discharge, hoarse voice and sore throat.   Eyes: Negative for discharge, redness, vision loss in right eye and visual halos.  Cardiovascular: Reports palpitations.  Negative for chest pain, dyspnea on exertion, leg swelling, orthopnea. Respiratory: Negative for cough, hemoptysis, shortness of breath and snoring.   Endocrine: Negative for heat intolerance and polyphagia.  Hematologic/Lymphatic: Negative for bleeding problem. Does not bruise/bleed easily.  Skin: Negative for flushing, nail changes, rash and suspicious lesions.  Musculoskeletal: Negative for arthritis, joint pain, muscle cramps, myalgias, neck pain and stiffness.  Gastrointestinal: Negative for abdominal pain, bowel incontinence, diarrhea and excessive appetite.  Genitourinary: Negative for decreased libido, genital sores and incomplete emptying.  Neurological: Negative for brief paralysis, focal weakness, headaches and loss of balance.  Psychiatric/Behavioral: Negative for altered mental status, depression and suicidal ideas.  Allergic/Immunologic: Negative for HIV exposure and persistent infections.    EKGs/Labs/Other Studies Reviewed:    The following studies were reviewed today:  EKG:  The ekg ordered today demonstrates Sinus rhythm, heart rate 72 bpm no prior EKG for comparison.  Recent Labs: 08/02/2020: ALT 20; BUN 10; Creatinine, Ser 0.76; Hemoglobin 14.2; Platelets 223; Potassium 4.8; Sodium 143; TSH 1.910  Recent Lipid Panel    Component Value Date/Time   CHOL 199 08/02/2020 1137   TRIG 103 08/02/2020 1137   HDL 53 08/02/2020 1137   CHOLHDL 3.8 08/02/2020 1137   LDLCALC 127 (H) 08/02/2020 1137    Physical Exam:    VS:  BP 128/62 (BP Location: Right Arm)   Pulse 72   Ht 5' (1.524 m)   Wt 154 lb 6.4 oz (70 kg)   SpO2 98%   BMI 30.15 kg/m     Wt Readings from Last 3 Encounters:  08/22/20 154 lb 6.4 oz (70 kg)  08/02/20 154 lb 3.2 oz  (69.9 kg)  03/06/20 155 lb (70.3 kg)     GEN: Well nourished, well developed in no acute distress HEENT: Normal NECK: No JVD; No carotid bruits LYMPHATICS: No lymphadenopathy CARDIAC: S1S2 noted,RRR, no murmurs, rubs, gallops RESPIRATORY:  Clear to auscultation without rales, wheezing or rhonchi  ABDOMEN: Soft, non-tender, non-distended, +bowel sounds, no guarding. EXTREMITIES: No edema, No cyanosis, no clubbing MUSCULOSKELETAL:  No deformity  SKIN: Warm and dry NEUROLOGIC:  Alert and oriented x 3, non-focal PSYCHIATRIC:  Normal affect, good insight  ASSESSMENT:    1. Palpitations   2. obesity (HCC)    PLAN:     1.  I would like to rule out a cardiovascular etiology of this palpitation, therefore at this time I would like to placed a zio patch for 14 days. In  additon a transthoracic echocardiogram will be ordered to assess LV/RV function and any structural abnormalities. Once these testing have been performed amd reviewed further reccomendations will be made. For now, I do reccomend that the patient goes to the nearest ED if  symptoms recur.  2.  The patient understands the need to lose weight with diet and exercise. We have discussed specific strategies for this.  The patient is in agreement with the above plan. The patient left the office in stable condition.  The patient will follow up in 3 months or sooner if needed.   Medication Adjustments/Labs and Tests Ordered: Current medicines are reviewed at length with the patient today.  Concerns regarding medicines are outlined above.  Orders Placed This Encounter  Procedures  . LONG TERM MONITOR (3-14 DAYS)  . EKG 12-Lead  . ECHOCARDIOGRAM COMPLETE   No orders of the defined types were placed in this encounter.   Patient Instructions  Medication Instructions:  Your physician recommends that you continue on your current medications as directed. Please refer to the Current Medication list given to you today.  *If you need a  refill on your cardiac medications before your next appointment, please call your pharmacy*   Lab Work: None   If you have labs (blood work) drawn today and your tests are completely normal, you will receive your results only by: Marland Kitchen. MyChart Message (if you have MyChart) OR . A paper copy in the mail If you have any lab test that is abnormal or we need to change your treatment, we will call you to review the results.   Testing/Procedures: Your physician has requested that you have an echocardiogram. Echocardiography is a painless test that uses sound waves to create images of your heart. It provides your doctor with information about the size and shape of your heart and how well your heart's chambers and valves are working. This procedure takes approximately one hour. There are no restrictions for this procedure.  A zio monitor was ordered today. It will remain on for 14 days. You will then return monitor and event diary in provided box. It takes 1-2 weeks for report to be downloaded and returned to us. We will call you with the results. If monitor falls off or has orange flashing light, please call Zio for further instructions.      Follow-Up: At [facility], you and your health needs are our priority.  As part of our continuing mission to provide you with exceptional heart care, we have created designated Provider Care Teams.  These Care Teams include your primary Cardiologist (physician) and Advanced Practice Providers (APPs -  Physician Assistants and Nurse Practitioners) who all work together to provide you with the care you need, when you need it.  We recommend signing up for the patient portal called "MyChart".  Sign up information is provided on this After Visit Summary.  MyChart is used to connect with patients for Virtual Visits (Telemedicine).  Patients are able to view lab/test results, encounter notes, upcoming appointments, etc.  Non-urgent messages can be sent to your provider  as well.   To learn more about what you can do with MyChart, go to [email or web address].    Your next appointment:   3 month(s)  The format for your next appointment:   In Person  Provider:   Thomasene RippleKardie Demarrius Guerrero, DO   Other Instructions      Adopting a Healthy Lifestyle.  Know what a healthy weight is for you (roughly BMI <25)  and aim to maintain this   Aim for 7+ servings of fruits and vegetables daily   65-80+ fluid ounces of water or unsweet tea for healthy kidneys   Limit to max 1 drink of alcohol per day; avoid smoking/tobacco   Limit animal fats in diet for cholesterol and heart health - choose grass fed whenever available   Avoid highly processed foods, and foods high in saturated/trans fats   Aim for low stress - take time to unwind and care for your mental health   Aim for 150 min of moderate intensity exercise weekly for heart health, and weights twice weekly for bone health   Aim for 7-9 hours of sleep daily   When it comes to diets, agreement about the perfect plan isnt easy to find, even among the experts. Experts at the [facility] [employer] developed an idea known as the Healthy Eating Plate. Just imagine a plate divided into logical, healthy portions.   The emphasis is on diet quality:   Load up on vegetables and fruits - one-half of your plate: Aim for color and variety, and remember that potatoes dont count.   Go for whole grains - one-quarter of your plate: Whole wheat, barley, wheat berries, quinoa, oats, brown rice, and foods made with them. If you want pasta, go with whole wheat pasta.   Protein power - one-quarter of your plate: Fish, chicken, beans, and nuts are all healthy, versatile protein sources. Limit red meat.   The diet, however, does go beyond the plate, offering a few other suggestions.   Use healthy plant oils, such as olive, canola, soy, corn, sunflower and peanut. Check the labels, and avoid partially hydrogenated oil,  which have unhealthy trans fats.   If youre thirsty, drink water. Coffee and tea are good in moderation, but skip sugary drinks and limit milk and dairy products to one or two daily servings.   The type of carbohydrate in the diet is more important than the amount. Some sources of carbohydrates, such as vegetables, fruits, whole grains, and beans-are healthier than others.   Finally, stay active  Signed, Thomasene Ripple, DO  08/22/2020 8:31 PM    [facility]

## 2020-08-22 NOTE — Progress Notes (Signed)
kg

## 2020-08-22 NOTE — Patient Instructions (Signed)
Medication Instructions:  Your physician recommends that you continue on your current medications as directed. Please refer to the Current Medication list given to you today.  *If you need a refill on your cardiac medications before your next appointment, please call your pharmacy*   Lab Work: None If you have labs (blood work) drawn today and your tests are completely normal, you will receive your results only by: . MyChart Message (if you have MyChart) OR . A paper copy in the mail If you have any lab test that is abnormal or we need to change your treatment, we will call you to review the results.   Testing/Procedures: Your physician has requested that you have an echocardiogram. Echocardiography is a painless test that uses sound waves to create images of your heart. It provides your doctor with information about the size and shape of your heart and how well your heart's chambers and valves are working. This procedure takes approximately one hour. There are no restrictions for this procedure.  A zio monitor was ordered today. It will remain on for 14 days. You will then return monitor and event diary in provided box. It takes 1-2 weeks for report to be downloaded and returned to us. We will call you with the results. If monitor falls off or has orange flashing light, please call Zio for further instructions.      Follow-Up: At [facility], you and your health needs are our priority.  As part of our continuing mission to provide you with exceptional heart care, we have created designated Provider Care Teams.  These Care Teams include your primary Cardiologist (physician) and Advanced Practice Providers (APPs -  Physician Assistants and Nurse Practitioners) who all work together to provide you with the care you need, when you need it.  We recommend signing up for the patient portal called "MyChart".  Sign up information is provided on this After Visit Summary.  MyChart is used to  connect with patients for Virtual Visits (Telemedicine).  Patients are able to view lab/test results, encounter notes, upcoming appointments, etc.  Non-urgent messages can be sent to your provider as well.   To learn more about what you can do with MyChart, go to [email or web address].    Your next appointment:   3 month(s)  The format for your next appointment:   In Person  Provider:   Kardie Tobb, DO   Other Instructions   

## 2020-09-06 ENCOUNTER — Other Ambulatory Visit: Payer: Self-pay

## 2020-09-06 MED ORDER — CELECOXIB 200 MG PO CAPS: 200.0000 mg | ORAL_CAPSULE | Freq: Every day | ORAL | 0 refills | Status: DC

## 2020-09-06 MED ORDER — VENLAFAXINE HCL ER 37.5 MG PO CP24: 37.5000 mg | ORAL_CAPSULE | Freq: Every day | ORAL | 0 refills | Status: DC

## 2020-09-06 MED ORDER — PANTOPRAZOLE SODIUM 40 MG PO TBEC: 40.0000 mg | DELAYED_RELEASE_TABLET | Freq: Every day | ORAL | 0 refills | Status: DC

## 2020-09-07 ENCOUNTER — Ambulatory Visit (INDEPENDENT_AMBULATORY_CARE_PROVIDER_SITE_OTHER): Payer: Medicare HMO

## 2020-09-07 ENCOUNTER — Other Ambulatory Visit: Payer: Self-pay

## 2020-09-07 DIAGNOSIS — R002 Palpitations: Secondary | ICD-10-CM

## 2020-09-07 DIAGNOSIS — I517 Cardiomegaly: Secondary | ICD-10-CM | POA: Diagnosis not present

## 2020-09-07 NOTE — Progress Notes (Signed)
Complete echocardiogram performed.  Jimmy Breahna Boylen RDCS, RVT  

## 2020-09-10 LAB — ECHOCARDIOGRAM COMPLETE
Area-P 1/2: 3.85 cm2
S' Lateral: 2.8 cm

## 2020-09-14 DIAGNOSIS — R002 Palpitations: Secondary | ICD-10-CM | POA: Diagnosis not present

## 2020-09-25 ENCOUNTER — Telehealth: Payer: Self-pay

## 2020-09-25 NOTE — Telephone Encounter (Signed)
Spoke with patient regarding results and recommendation.  Patient verbalizes understanding and is agreeable to plan of care. Advised patient to call back with any issues or concerns.  

## 2020-09-25 NOTE — Telephone Encounter (Signed)
-----   Message from Thomasene Ripple, DO sent at 09/20/2020  9:45 PM EST ----- Please bring the patient sooner for [place] to discuss her monitor results.

## 2020-10-02 ENCOUNTER — Encounter: Payer: Self-pay | Admitting: Cardiology

## 2020-10-02 ENCOUNTER — Other Ambulatory Visit: Payer: Self-pay

## 2020-10-02 ENCOUNTER — Ambulatory Visit: Payer: Medicare HMO | Admitting: Cardiology

## 2020-10-02 VITALS — BP 132/62 | HR 76 | Ht 60.0 in | Wt 153.8 lb

## 2020-10-02 DIAGNOSIS — R079 Chest pain, unspecified: Secondary | ICD-10-CM

## 2020-10-02 DIAGNOSIS — I4719 Other supraventricular tachycardia: Secondary | ICD-10-CM

## 2020-10-02 DIAGNOSIS — I4729 Other ventricular tachycardia: Secondary | ICD-10-CM

## 2020-10-02 DIAGNOSIS — I472 Ventricular tachycardia: Secondary | ICD-10-CM

## 2020-10-02 DIAGNOSIS — I471 Supraventricular tachycardia: Secondary | ICD-10-CM | POA: Diagnosis not present

## 2020-10-02 HISTORY — DX: Other ventricular tachycardia: I47.29

## 2020-10-02 HISTORY — DX: Chest pain, unspecified: R07.9

## 2020-10-02 HISTORY — DX: Other supraventricular tachycardia: I47.19

## 2020-10-02 HISTORY — DX: Morbid (severe) obesity due to excess calories: E66.01

## 2020-10-02 MED ORDER — METOPROLOL SUCCINATE ER 25 MG PO TB24: 12.5000 mg | ORAL_TABLET | Freq: Every day | ORAL | 2 refills | Status: DC

## 2020-10-02 NOTE — Progress Notes (Signed)
Cardiology Office Note:    Date:  10/02/2020   ID:  Brittany Short, DOB 09/21/1956, MRN [id number]  PCP:  Marianne Sofiaavis, Sara, PA-C  Cardiologist:  Thomasene RippleKardie Rian Busche, DO  Electrophysiologist:  None   Referring MD: Marianne Sofiaavis, Sara, PA-C  " I am doing fine home most days but when I walk it feels some chest discomfort"  History of Present Illness:    Brittany Short is a 64 y.o. female with a hx of  GERD, major depressive disorder presents initially presented on October 26,2021 to be evaluated for palpitations.  At that time I placed a monitor on the patient and ordered an echocardiogram.   Today she is here for follow-up visit to discuss her monitor results.  The patient tells me that she has been experiencing some chest discomfort on exertion.  She described as a light pressure-like sensation which last for few minutes prior to resolution.  She also does have some associated shortness of breath.   Past Medical History:  Diagnosis Date  . GERD (gastroesophageal reflux disease)   . Major depressive disorder, single episode, moderate (HCC)   . Mild intermittent asthma with (acute) exacerbation   . Nontoxic single thyroid nodule     Past Surgical History:  Procedure Laterality Date  . CESAREAN SECTION  1982 & 1998   X2  . THYROIDECTOMY Right     Current Medications: Current Meds  Medication Sig  . albuterol (VENTOLIN HFA) 108 (90 Base) MCG/ACT inhaler Inhale 2 puffs into the lungs every 4 (four) hours as needed for wheezing or shortness of breath.  . celecoxib (CELEBREX) 200 MG capsule Take 1 capsule (200 mg total) by mouth daily.  . Cholecalciferol 50 MCG (2000 UT) CAPS Take by mouth.  . fluticasone (FLONASE) 50 MCG/ACT nasal spray Place 2 sprays into both nostrils daily.  . montelukast (SINGULAIR) 10 MG tablet Take 10 mg by mouth daily.  . pantoprazole (PROTONIX) 40 MG tablet Take 1 tablet (40 mg total) by mouth daily.  Marland Kitchen. venlafaxine XR (EFFEXOR-XR) 37.5 MG 24 hr capsule Take 1 capsule (37.5 mg  total) by mouth daily.     Allergies:   Patient has no known allergies.   Social History   Socioeconomic History  . Marital status: Married    Spouse name: Not on file  . Number of children: 2  . Years of education: Not on file  . Highest education level: Not on file  Occupational History  . Not on file  Tobacco Use  . Smoking status: Never Smoker  . Smokeless tobacco: Never Used  Vaping Use  . Vaping Use: Never used  Substance and Sexual Activity  . Alcohol use: Not Currently  . Drug use: Never  . Sexual activity: Not on file  Other Topics Concern  . Not on file  Social History Narrative  . Not on file   Social Determinants of Health   Financial Resource Strain:   . Difficulty of Paying Living Expenses: Not on file  Food Insecurity:   . Worried About Programme researcher, broadcasting/film/videounning Out of Food in the Last Year: Not on file  . Ran Out of Food in the Last Year: Not on file  Transportation Needs:   . Lack of Transportation (Medical): Not on file  . Lack of Transportation (Non-Medical): Not on file  Physical Activity:   . Days of Exercise per Week: Not on file  . Minutes of Exercise per Session: Not on file  Stress:   . Feeling of Stress : Not on  file  Social Connections:   . Frequency of Communication with Friends and Family: Not on file  . Frequency of Social Gatherings with Friends and Family: Not on file  . Attends Religious Services: Not on file  . Active Member of Clubs or Organizations: Not on file  . Attends Banker Meetings: Not on file  . Marital Status: Not on file     Family History: The patient's family history includes COPD in her brother and mother; Depression in her sister; Heart attack in her father; Lung cancer in her brother and mother; Suicidality in her sister.  ROS:   Review of Systems  Constitution: Negative for decreased appetite, fever and weight gain.  HENT: Negative for congestion, ear discharge, hoarse voice and sore throat.   Eyes: Negative  for discharge, redness, vision loss in right eye and visual halos.  Cardiovascular: Negative for chest pain, dyspnea on exertion, leg swelling, orthopnea and palpitations.  Respiratory: Negative for cough, hemoptysis, shortness of breath and snoring.   Endocrine: Negative for heat intolerance and polyphagia.  Hematologic/Lymphatic: Negative for bleeding problem. Does not bruise/bleed easily.  Skin: Negative for flushing, nail changes, rash and suspicious lesions.  Musculoskeletal: Negative for arthritis, joint pain, muscle cramps, myalgias, neck pain and stiffness.  Gastrointestinal: Negative for abdominal pain, bowel incontinence, diarrhea and excessive appetite.  Genitourinary: Negative for decreased libido, genital sores and incomplete emptying.  Neurological: Negative for brief paralysis, focal weakness, headaches and loss of balance.  Psychiatric/Behavioral: Negative for altered mental status, depression and suicidal ideas.  Allergic/Immunologic: Negative for HIV exposure and persistent infections.    EKGs/Labs/Other Studies Reviewed:    The following studies were reviewed today:   EKG:  The ekg ordered today demonstrates    Zio monitor  The patient wore the monitor for 13 days 17 hours starting August 22, 2020. Indication: Palpitations  The minimum heart rate was 55 bpm, maximum heart rate was 171 bpm, and average heart rate was 78 bpm. Predominant underlying rhythm was Sinus Rhythm.  1 run of Ventricular Tachycardia of occurred lasting 7 beats with a maximum rate of 146 bpm (average 132 bpm).   8 Supraventricular Tachycardia runs occurred, the run with the fastest interval lasting 5 beats with a maximum rate of 171 bpm, the longest lasting 11 beats with an averge rate of 141 bpm.  Premature atrial complexes were rare (<1.0%). Premature Ventricular complexes were rare (<1.0%).  No pauses, No AV block and no atrial fibrillation present. 21  patient triggered events:  10 associated ventricular ectopy, 1 associated with supraventricular tachycardia and the remaining associated with sinus rhythm.  Conclusion: This study is remarkable for the following:                                           1. Nonsustained ventricular tachycardia                                           2. Paroxysmal Supraventricular tachycardia which is likely atrial tachycardia with variable block.    TTE IMPRESSIONS  1. Left ventricular ejection fraction, by estimation, is 60 to 65%. The left ventricle has normal function. The left ventricle has no regional wall motion abnormalities. There is mild concentric left ventricular hypertrophy. Left ventricular diastolic  parameters were normal.  2. Right ventricular systolic function is normal. The right ventricular size is normal. There is normal pulmonary artery systolic pressure. 3. The mitral valve is normal in structure. Mild mitral valve regurgitation. No evidence of mitral stenosis.  4. The aortic valve is tricuspid. Aortic valve regurgitation is not visualized. No aortic stenosis is present.  5. The inferior vena cava is normal in size with greater than 50% respiratory variability, suggesting right atrial pressure of   Recent Labs: 08/02/2020: ALT 20; BUN 10; Creatinine, Ser 0.76; Hemoglobin 14.2; Platelets 223; Potassium 4.8; Sodium 143; TSH 1.910  Recent Lipid Panel    Component Value Date/Time   CHOL 199 08/02/2020 1137   TRIG 103 08/02/2020 1137   HDL 53 08/02/2020 1137   CHOLHDL 3.8 08/02/2020 1137   LDLCALC 127 (H) 08/02/2020 1137    Physical Exam:    VS:  BP 132/62   Pulse 76   Ht 5' (1.524 m)   Wt 153 lb 12.8 oz (69.8 kg)   SpO2 98%   BMI 30.04 kg/m     Wt Readings from Last 3 Encounters:  10/02/20 153 lb 12.8 oz (69.8 kg)  08/22/20 154 lb 6.4 oz (70 kg)  08/02/20 154 lb 3.2 oz (69.9 kg)     GEN: Well nourished, well developed in no acute distress HEENT: Normal NECK: No JVD; No carotid  bruits LYMPHATICS: No lymphadenopathy CARDIAC: S1S2 noted,RRR, no murmurs, rubs, gallops RESPIRATORY:  Clear to auscultation without rales, wheezing or rhonchi  ABDOMEN: Soft, non-tender, non-distended, +bowel sounds, no guarding. EXTREMITIES: No edema, No cyanosis, no clubbing MUSCULOSKELETAL:  No deformity  SKIN: Warm and dry NEUROLOGIC:  Alert and oriented x 3, non-focal PSYCHIATRIC:  Normal affect, good insight  ASSESSMENT:    1. NSVT (nonsustained ventricular tachycardia) (HCC)   2. PAT (paroxysmal atrial tachycardia) (HCC)   3. Chest pain of uncertain etiology   4. Morbid obesity (HCC)    PLAN:    We discussed her intermittent chest pain and in the setting of NSVT as well like to pursue an ischemic evaluation in this patient.  We discussed various test modality a coronary CTA would be appropriate at this time. Educated patient about the testing she is agreeable to proceed.  She has no IV contrast dye allergy.  Her lipid profile also does have LDL 127 for now she prefers to continue with diet modification.  If there are coronary calcification we will consider statin medication.  Low-dose beta-blocker Toprol 12.5 mg will be started for paroxysmal tachycardia.  The patient understands the need to lose weight with diet and exercise. We have discussed specific strategies for this.  The patient is in agreement with the above plan. The patient left the office in stable condition.  The patient will follow up in   Medication Adjustments/Labs and Tests Ordered: Current medicines are reviewed at length with the patient today.  Concerns regarding medicines are outlined above.  No orders of the defined types were placed in this encounter.  No orders of the defined types were placed in this encounter.   There are no Patient Instructions on file for this visit.   Adopting a Healthy Lifestyle.  Know what a healthy weight is for you (roughly BMI <25) and aim to maintain this   Aim  for 7+ servings of fruits and vegetables daily   65-80+ fluid ounces of water or unsweet tea for healthy kidneys   Limit to max 1 drink of alcohol per day; avoid  smoking/tobacco   Limit animal fats in diet for cholesterol and heart health - choose grass fed whenever available   Avoid highly processed foods, and foods high in saturated/trans fats   Aim for low stress - take time to unwind and care for your mental health   Aim for 150 min of moderate intensity exercise weekly for heart health, and weights twice weekly for bone health   Aim for 7-9 hours of sleep daily   When it comes to diets, agreement about the perfect plan isnt easy to find, even among the experts. Experts at the [facility] of [employer] developed an idea known as the Healthy Eating Plate. Just imagine a plate divided into logical, healthy portions.   The emphasis is on diet quality:   Load up on vegetables and fruits - one-half of your plate: Aim for color and variety, and remember that potatoes dont count.   Go for whole grains - one-quarter of your plate: Whole wheat, barley, wheat berries, quinoa, oats, brown rice, and foods made with them. If you want pasta, go with whole wheat pasta.   Protein power - one-quarter of your plate: Fish, chicken, beans, and nuts are all healthy, versatile protein sources. Limit red meat.   The diet, however, does go beyond the plate, offering a few other suggestions.   Use healthy plant oils, such as olive, canola, soy, corn, sunflower and peanut. Check the labels, and avoid partially hydrogenated oil, which have unhealthy trans fats.   If youre thirsty, drink water. Coffee and tea are good in moderation, but skip sugary drinks and limit milk and dairy products to one or two daily servings.   The type of carbohydrate in the diet is more important than the amount. Some sources of carbohydrates, such as vegetables, fruits, whole grains, and beans-are healthier than  others.   Finally, stay active  Signed, Thomasene Ripple, DO  10/02/2020 1:17 PM    [facility]

## 2020-10-02 NOTE — Patient Instructions (Addendum)
Medication Instructions:  Your physician has recommended you make the following change in your medication:  1. Start Toprol XL 12.5 mg daily  *If you need a refill on your cardiac medications before your next appointment, please call your pharmacy*   Lab Work: Your physician recommends that you return for lab work in: 3-7 days before CT: BMP  If you have labs (blood work) drawn today and your tests are completely normal, you will receive your results only by: Marland Kitchen MyChart Message (if you have MyChart) OR . A paper copy in the mail If you have any lab test that is abnormal or we need to change your treatment, we will call you to review the results.   Testing/Procedures: Please arrive at the [facility] main entrance of [facility] at xx:xx AM (30-45 minutes prior to test start time)  [facility] [address] [place], [place] [address] [phone]  Proceed to the [facility] Radiology Department (First Floor).  Please follow these instructions carefully (unless otherwise directed):    On the Night Before the Test: . Drink plenty of water. . Do not consume any caffeinated/decaffeinated beverages or chocolate 12 hours prior to your test. . Do not take any antihistamines 12 hours prior to your test. .    On the Day of the Test: . Drink plenty of water. Do not drink any water within one hour of the test. . Do not eat any food 4 hours prior to the test. . You may take your regular medications prior to the test. . Take Toprol 2 hours before CT After the Test: . Drink plenty of water. . After receiving IV contrast, you may experience a mild flushed feeling. This is normal. . On occasion, you may experience a mild rash up to 24 hours after the test. This is not dangerous. If this occurs, you can take Benadryl 25 mg and increase your fluid intake. . If you experience trouble breathing, this can be serious. If it is severe call 911 IMMEDIATELY. If it is mild,  please call our office. . If you take any of these medications: Glipizide/Metformin, Avandament, Glucavance, please do not take 48 hours after completing test.    Follow-Up: At [facility], you and your health needs are our priority.  As part of our continuing mission to provide you with exceptional heart care, we have created designated Provider Care Teams.  These Care Teams include your primary Cardiologist (physician) and Advanced Practice Providers (APPs -  Physician Assistants and Nurse Practitioners) who all work together to provide you with the care you need, when you need it.  We recommend signing up for the patient portal called "MyChart".  Sign up information is provided on this After Visit Summary.  MyChart is used to connect with patients for Virtual Visits (Telemedicine).  Patients are able to view lab/test results, encounter notes, upcoming appointments, etc.  Non-urgent messages can be sent to your provider as well.   To learn more about what you can do with MyChart, go to [email or web address].    Your next appointment:   3 month(s)  The format for your next appointment:   In Person  Provider:   Thomasene Ripple, DO   Other Instructions NONE   Cardiac CT Angiogram A cardiac CT angiogram is a procedure to look at the heart and the area around the heart. It may be done to help find the cause of chest pains or other symptoms of heart disease. During this  procedure, a substance called contrast dye is injected into the blood vessels in the area to be checked. A large X-ray machine, called a CT scanner, then takes detailed pictures of the heart and the surrounding area. The procedure is also sometimes called a coronary CT angiogram, coronary artery scanning, or CTA. A cardiac CT angiogram allows the health care provider to see how well blood is flowing to and from the heart. The health care provider will be able to see if there are any problems, such as:  Blockage or narrowing  of the coronary arteries in the heart.  Fluid around the heart.  Signs of weakness or disease in the muscles, valves, and tissues of the heart. Tell a health care provider about:  Any allergies you have. This is especially important if you have had a previous allergic reaction to contrast dye.  All medicines you are taking, including vitamins, herbs, eye drops, creams, and over-the-counter medicines.  Any blood disorders you have.  Any surgeries you have had.  Any medical conditions you have.  Whether you are pregnant or may be pregnant.  Any anxiety disorders, chronic pain, or other conditions you have that may increase your stress or prevent you from lying still. What are the risks? Generally, this is a safe procedure. However, problems may occur, including:  Bleeding.  Infection.  Allergic reactions to medicines or dyes.  Damage to other structures or organs.  Kidney damage from the contrast dye that is used.  Increased risk of cancer from radiation exposure. This risk is low. Talk with your health care provider about: ? The risks and benefits of testing. ? How you can receive the lowest dose of radiation. What happens before the procedure?  Wear comfortable clothing and remove any jewelry, glasses, dentures, and hearing aids.  Follow instructions from your health care provider about eating and drinking. This may include: ? For 12 hours before the procedure -- avoid caffeine. This includes tea, coffee, soda, energy drinks, and diet pills. Drink plenty of water or other fluids that do not have caffeine in them. Being well hydrated can prevent complications. ? For 4-6 hours before the procedure -- stop eating and drinking. The contrast dye can cause nausea, but this is less likely if your stomach is empty.  Ask your health care provider about changing or stopping your regular medicines. This is especially important if you are taking diabetes medicines, blood thinners, or  medicines to treat problems with erections (erectile dysfunction). What happens during the procedure?   Hair on your chest may need to be removed so that small sticky patches called electrodes can be placed on your chest. These will transmit information that helps to monitor your heart during the procedure.  An IV will be inserted into one of your veins.  You might be given a medicine to control your heart rate during the procedure. This will help to ensure that good images are obtained.  You will be asked to lie on an exam table. This table will slide in and out of the CT machine during the procedure.  Contrast dye will be injected into the IV. You might feel warm, or you may get a metallic taste in your mouth.  You will be given a medicine called nitroglycerin. This will relax or dilate the arteries in your heart.  The table that you are lying on will move into the CT machine tunnel for the scan.  The person running the machine will give you instructions while  the scans are being done. You may be asked to: ? Keep your arms above your head. ? Hold your breath. ? Stay very still, even if the table is moving.  When the scanning is complete, you will be moved out of the machine.  The IV will be removed. The procedure may vary among health care providers and hospitals. What can I expect after the procedure? After your procedure, it is common to have:  A metallic taste in your mouth from the contrast dye.  A feeling of warmth.  A headache from the nitroglycerin. Follow these instructions at home:  Take over-the-counter and prescription medicines only as told by your health care provider.  If you are told, drink enough fluid to keep your urine pale yellow. This will help to flush the contrast dye out of your body.  Most people can return to their normal activities right after the procedure. Ask your health care provider what activities are safe for you.  It is up to you to get  the results of your procedure. Ask your health care provider, or the department that is doing the procedure, when your results will be ready.  Keep all follow-up visits as told by your health care provider. This is important. Contact a health care provider if:  You have any symptoms of allergy to the contrast dye. These include: ? Shortness of breath. ? Rash or hives. ? A racing heartbeat. Summary  A cardiac CT angiogram is a procedure to look at the heart and the area around the heart. It may be done to help find the cause of chest pains or other symptoms of heart disease.  During this procedure, a large X-ray machine, called a CT scanner, takes detailed pictures of the heart and the surrounding area after a contrast dye has been injected into blood vessels in the area.  Ask your health care provider about changing or stopping your regular medicines before the procedure. This is especially important if you are taking diabetes medicines, blood thinners, or medicines to treat erectile dysfunction.  If you are told, drink enough fluid to keep your urine pale yellow. This will help to flush the contrast dye out of your body. This information is not intended to replace advice given to you by your health care provider. Make sure you discuss any questions you have with your health care provider. Document Revised: 06/09/2019 Document Reviewed: 06/09/2019 Elsevier Patient Education  2020 Elsevier Inc.  Metoprolol Extended-Release Capsules What is this medicine? METOPROLOL (me TOE proe lole) is a beta blocker. It decreases the amount of work your heart has to do and helps your heart beat regularly. It treats high blood pressure and/or prevents chest pain (also called angina). It also treats heart failure. This medicine may be used for other purposes; ask your health care provider or pharmacist if you have questions. COMMON BRAND NAME(S): KAPSPARGO What should I tell my health care provider before  I take this medicine? They need to know if you have any of these conditions:  diabetes  heart disease  liver disease  lung or breathing disease, like asthma  pheochromocytoma  thyroid disease  an unusual or allergic reaction to metoprolol, other beta-blockers, medicines, foods, dyes, or preservatives  pregnant or trying to get pregnant  breast-feeding How should I use this medicine? Take this drug by mouth with water. Take it as directed on the prescription label at the same time every day. Do not cut, crush or chew this drug. Swallow  the capsules whole. You may open the capsule and put the contents in 1 teaspoon of applesauce. Swallow the drug and applesauce right away. Do not chew the drug or applesauce. Keep taking it unless your health care provider tells you to stop. Talk to your health care provider about the use of this drug in children. While it may be prescribed for children as young as 6 for selected conditions, precautions do apply. Overdosage: If you think you have taken too much of this medicine contact a poison control center or emergency room at once. NOTE: This medicine is only for you. Do not share this medicine with others. What if I miss a dose? If you miss a dose, take it as soon as you can. If it is almost time for your next dose, take only that dose. Do not take double or extra doses. What may interact with this medicine? This medicine may interact with the following medications:  certain medicines for blood pressure, heart disease, irregular heart beat  epinephrine  fluoxetine  MAOIs like Carbex, Eldepryl, Marplan, Nardil, and Parnate  paroxetine  reserpine This list may not describe all possible interactions. Give your health care provider a list of all the medicines, herbs, non-prescription drugs, or dietary supplements you use. Also tell them if you smoke, drink alcohol, or use illegal drugs. Some items may interact with your medicine. What should I  watch for while using this medicine? You may get drowsy or dizzy. Do not drive, use machinery, or do anything that needs mental alertness until you know how this medicine affects you. Do not stand or sit up quickly, especially if you are an older patient. This reduces the risk of dizzy or fainting spells. Alcohol may interfere with the effect of this medicine. Avoid alcoholic drinks. Visit your doctor or health care professional for regular checks on your progress. Check your blood pressure as directed. Ask your doctor or health care professional what your blood pressure should be and when you should contact him or her. Do not treat yourself for coughs, colds, or pain while you are using this medicine without asking your doctor or health care professional for advice. Some ingredients may increase your blood pressure. This medicine may increase blood sugar. Ask your healthcare provider if changes in diet or medicines are needed if you have diabetes. What side effects may I notice from receiving this medicine? Side effects that you should report to your doctor or health care professional as soon as possible:  allergic reactions like skin rash, itching or hives, swelling of the face, lips, or tongue  cold hands or feet  signs and symptoms of high blood sugar such as being more thirsty or hungry or having to urinate more than normal. You may also feel very tired or have blurry vision.  signs and symptoms of low blood pressure like dizziness; feeling faint or lightheaded, falls; unusually weak or tired  signs of worsening heart failure like breathing problems, swelling in your legs and feet  suicidal thoughts or other mood changes  unusually slow heartbeat Side effects that usually do not require medical attention (report these to your doctor or health care professional if they continue or are bothersome):  anxious  change in sex drive or performance  diarrhea  headache  trouble  sleeping  upset stomach This list may not describe all possible side effects. Call your doctor for medical advice about side effects. You may report side effects to FDA at 1-800-FDA-1088. Where should I  keep my medicine? Keep out of the reach of children and pets. Store at room temperature between 20 and 25 degrees C (68 and 77 degrees F). Throw away any unused drug after the expiration date. NOTE: This sheet is a summary. It may not cover all possible information. If you have questions about this medicine, talk to your doctor, pharmacist, or health care provider.  2020 Elsevier/Gold Standard (2019-07-14 13:24:03)

## 2020-10-08 DIAGNOSIS — J069 Acute upper respiratory infection, unspecified: Secondary | ICD-10-CM | POA: Diagnosis not present

## 2020-10-08 DIAGNOSIS — Z20828 Contact with and (suspected) exposure to other viral communicable diseases: Secondary | ICD-10-CM | POA: Diagnosis not present

## 2020-10-18 DIAGNOSIS — I471 Supraventricular tachycardia: Secondary | ICD-10-CM | POA: Diagnosis not present

## 2020-10-18 DIAGNOSIS — R079 Chest pain, unspecified: Secondary | ICD-10-CM | POA: Diagnosis not present

## 2020-10-18 DIAGNOSIS — I472 Ventricular tachycardia: Secondary | ICD-10-CM | POA: Diagnosis not present

## 2020-10-19 ENCOUNTER — Telehealth: Payer: Self-pay

## 2020-10-19 LAB — BASIC METABOLIC PANEL
BUN/Creatinine Ratio: 28 (ref 12–28)
BUN: 18 mg/dL (ref 8–27)
CO2: 25 mmol/L (ref 20–29)
Calcium: 8.9 mg/dL (ref 8.7–10.3)
Chloride: 106 mmol/L (ref 96–106)
Creatinine, Ser: 0.64 mg/dL (ref 0.57–1.00)
GFR calc Af Amer: 108 mL/min/{1.73_m2} (ref >59)
GFR calc non Af Amer: 94 mL/min/{1.73_m2} (ref >59)
Glucose: 90 mg/dL (ref 65–99)
Potassium: 3.9 mmol/L (ref 3.5–5.2)
Sodium: 143 mmol/L (ref 134–144)

## 2020-10-19 NOTE — Telephone Encounter (Signed)
Patient notified of results and verbalized understanding.  

## 2020-10-19 NOTE — Telephone Encounter (Signed)
-----   Message from Thomasene Ripple, DO sent at 10/19/2020 10:30 AM EST ----- Labs normal

## 2020-10-24 ENCOUNTER — Telehealth (HOSPITAL_COMMUNITY): Payer: Self-pay | Admitting: Emergency Medicine

## 2020-10-24 NOTE — Telephone Encounter (Signed)
Reaching out to patient to offer assistance regarding upcoming cardiac imaging study; pt verbalizes understanding of appt date/time, parking situation and where to check in, pre-test NPO status and medications ordered, and verified current allergies; name and call back number provided for further questions should they arise Jacari Kirsten RN Navigator Cardiac Imaging [facility] [phone] office [phone] cell 

## 2020-10-25 ENCOUNTER — Other Ambulatory Visit: Payer: Self-pay

## 2020-10-25 ENCOUNTER — Encounter: Payer: Medicare HMO | Admitting: *Deleted

## 2020-10-25 ENCOUNTER — Ambulatory Visit (HOSPITAL_COMMUNITY)
Admission: RE | Admit: 2020-10-25 | Discharge: 2020-10-25 | Disposition: A | Discharge status: Home / Self Care | Payer: Medicare HMO | Source: Ambulatory Visit | Attending: Cardiology | Admitting: Cardiology

## 2020-10-25 ENCOUNTER — Ambulatory Visit (HOSPITAL_COMMUNITY): Payer: Medicare HMO

## 2020-10-25 ENCOUNTER — Telehealth: Payer: Self-pay | Admitting: Cardiology

## 2020-10-25 ENCOUNTER — Encounter (HOSPITAL_COMMUNITY): Payer: Self-pay

## 2020-10-25 DIAGNOSIS — I472 Ventricular tachycardia: Secondary | ICD-10-CM | POA: Insufficient documentation

## 2020-10-25 DIAGNOSIS — R079 Chest pain, unspecified: Secondary | ICD-10-CM

## 2020-10-25 DIAGNOSIS — I471 Supraventricular tachycardia: Secondary | ICD-10-CM

## 2020-10-25 DIAGNOSIS — I700 Atherosclerosis of aorta: Secondary | ICD-10-CM

## 2020-10-25 DIAGNOSIS — Z006 Encounter for examination for normal comparison and control in clinical research program: Secondary | ICD-10-CM

## 2020-10-25 DIAGNOSIS — I4729 Other ventricular tachycardia: Secondary | ICD-10-CM

## 2020-10-25 MED ORDER — IOHEXOL 350 MG/ML SOLN
80.0000 mL | Freq: Once | INTRAVENOUS | Status: AC | PRN
  Administered 2020-10-25: 80 mL via INTRAVENOUS

## 2020-10-25 MED ORDER — NITROGLYCERIN 0.4 MG SL SUBL: 0.8000 mg | SUBLINGUAL_TABLET | Freq: Once | SUBLINGUAL | Status: AC

## 2020-10-25 MED ORDER — NITROGLYCERIN 0.4 MG SL SUBL
SUBLINGUAL_TABLET | SUBLINGUAL | Status: AC
  Administered 2020-10-25: 0.8 mg via SUBLINGUAL
  Filled 2020-10-25: qty 2

## 2020-10-25 NOTE — Telephone Encounter (Signed)
Patient is requesting to review CT results. Please call.

## 2020-10-25 NOTE — Telephone Encounter (Signed)
Please let her know that the official results has not been posted. I am out of the office and will review her images  it when I get back.

## 2020-10-25 NOTE — Research (Signed)
Subject Name: Brittany Short  Subject met inclusion and exclusion criteria.  The informed consent form, study requirements and expectations were reviewed with the subject and questions and concerns were addressed prior to the signing of the consent form.  The subject verbalized understanding of the trial requirements.  The subject agreed to participate in the IDENTIFY trial and signed the informed consent at 0830 on 10/25/20  The informed consent was obtained prior to performance of any protocol-specific procedures for the subject.  A copy of the signed informed consent was given to the subject and a copy was placed in the subject's medical record.   Timoteo Gaul

## 2020-10-26 DIAGNOSIS — I700 Atherosclerosis of aorta: Secondary | ICD-10-CM | POA: Diagnosis not present

## 2020-10-26 NOTE — Telephone Encounter (Signed)
Spoke to the patient just now and let her know Dr. Servando Salina will review these results as soon as she gets the report next week. She verbalizes understanding and thanks me for the call back.

## 2020-10-31 ENCOUNTER — Encounter: Payer: Self-pay | Admitting: Cardiology

## 2020-10-31 ENCOUNTER — Telehealth (INDEPENDENT_AMBULATORY_CARE_PROVIDER_SITE_OTHER): Payer: Medicare HMO | Admitting: Cardiology

## 2020-10-31 ENCOUNTER — Other Ambulatory Visit: Payer: Self-pay

## 2020-10-31 ENCOUNTER — Other Ambulatory Visit: Payer: Self-pay | Admitting: Physician Assistant

## 2020-10-31 ENCOUNTER — Telehealth: Payer: Self-pay

## 2020-10-31 VITALS — BP 119/62 | Ht 61.0 in | Wt 150.0 lb

## 2020-10-31 DIAGNOSIS — I471 Supraventricular tachycardia: Secondary | ICD-10-CM

## 2020-10-31 DIAGNOSIS — R051 Acute cough: Secondary | ICD-10-CM | POA: Diagnosis not present

## 2020-10-31 DIAGNOSIS — R509 Fever, unspecified: Secondary | ICD-10-CM | POA: Diagnosis not present

## 2020-10-31 DIAGNOSIS — I4719 Other supraventricular tachycardia: Secondary | ICD-10-CM

## 2020-10-31 DIAGNOSIS — I472 Ventricular tachycardia: Secondary | ICD-10-CM

## 2020-10-31 DIAGNOSIS — I4729 Other ventricular tachycardia: Secondary | ICD-10-CM

## 2020-10-31 DIAGNOSIS — R519 Headache, unspecified: Secondary | ICD-10-CM | POA: Diagnosis not present

## 2020-10-31 DIAGNOSIS — Z20828 Contact with and (suspected) exposure to other viral communicable diseases: Secondary | ICD-10-CM | POA: Diagnosis not present

## 2020-10-31 NOTE — Patient Instructions (Addendum)
Medication Instructions:  Your physician recommends that you continue on your current medications as directed. Please refer to the Current Medication list given to you today.  *If you need a refill on your cardiac medications before your next appointment, please call your pharmacy*   Lab Work: None ordered  If you have labs (blood work) drawn today and your tests are completely normal, you will receive your results only by: . MyChart Message (if you have MyChart) OR . A paper copy in the mail If you have any lab test that is abnormal or we need to change your treatment, we will call you to review the results.   Testing/Procedures: None ordered   Follow-Up: At [facility], you and your health needs are our priority.  As part of our continuing mission to provide you with exceptional heart care, we have created designated Provider Care Teams.  These Care Teams include your primary Cardiologist (physician) and Advanced Practice Providers (APPs -  Physician Assistants and Nurse Practitioners) who all work together to provide you with the care you need, when you need it.  We recommend signing up for the patient portal called "MyChart".  Sign up information is provided on this After Visit Summary.  MyChart is used to connect with patients for Virtual Visits (Telemedicine).  Patients are able to view lab/test results, encounter notes, upcoming appointments, etc.  Non-urgent messages can be sent to your provider as well.   To learn more about what you can do with MyChart, go to [email or web address].    Your next appointment:   6 month(s)  The format for your next appointment:   In Person  Provider:   Kardie Tobb, DO   Other Instructions  

## 2020-10-31 NOTE — Telephone Encounter (Signed)
Brittany Short called to report that her Mother was seen this morning at [facility]gent [facility] and tested positive for COVID.  She then called and talked with cardiology and they have recommended the antibody infusion.  Discussed with Marianne Sofia, PA and she advised that they call Urgent Care for the referral to the infusion center.

## 2020-10-31 NOTE — Progress Notes (Signed)
Video visit  Date:  11/01/2020   ID:  Brittany Short, DOB October 31, 1955, MRN [id number]  The patient is at home I am in the office.   PCP:  Marianne Sofia, PA-C  Cardiologist:  Thomasene Ripple, DO Electrophysiologist:  None   Evaluation Performed:  Follow up visit  Chief Complaint:  I am doing well, but I was covid 19 positive.  History of Present Illness:    Brittany Short is a 66 y.o. female with major depressive disorder, GERD presents today. I saw the patient on 10/02/20 at that time. During that visit I started the patient on low dose beta blocker due to Toprol 12.5mg  . She also reported chest pain therefore a CCTA was ordered.   Today she reports that since starting the beta blocker she had felt better.   The patient does have symptoms concerning for COVID-19 infection (fever, chills, cough, or new shortness of breath).    Past Medical History:  Diagnosis Date  . GERD (gastroesophageal reflux disease)   . Major depressive disorder, single episode, moderate (HCC)   . Mild intermittent asthma with (acute) exacerbation   . Nontoxic single thyroid nodule    Past Surgical History:  Procedure Laterality Date  . CESAREAN SECTION  1982 & 1998   X2  . THYROIDECTOMY Right      Current Meds  Medication Sig  . albuterol (VENTOLIN HFA) 108 (90 Base) MCG/ACT inhaler Inhale 2 puffs into the lungs every 4 (four) hours as needed for wheezing or shortness of breath.  . celecoxib (CELEBREX) 200 MG capsule Take 1 capsule (200 mg total) by mouth daily.  . Cholecalciferol 50 MCG (2000 UT) CAPS Take by mouth.  . fluticasone (FLONASE) 50 MCG/ACT nasal spray Place 2 sprays into both nostrils daily.  . metoprolol succinate (TOPROL XL) 25 MG 24 hr tablet Take 0.5 tablets (12.5 mg total) by mouth daily.  . montelukast (SINGULAIR) 10 MG tablet Take 10 mg by mouth daily.  . pantoprazole (PROTONIX) 40 MG tablet Take 1 tablet (40 mg total) by mouth daily.  Marland Kitchen venlafaxine XR (EFFEXOR-XR) 37.5 MG 24 hr capsule  Take 1 capsule (37.5 mg total) by mouth daily.     Allergies:   Patient has no known allergies.   Social History   Tobacco Use  . Smoking status: Never Smoker  . Smokeless tobacco: Never Used  Vaping Use  . Vaping Use: Never used  Substance Use Topics  . Alcohol use: Not Currently  . Drug use: Never     Family Hx: The patient's family history includes COPD in her brother and mother; Depression in her sister; Heart attack in her father; Lung cancer in her brother and mother; Suicidality in her sister.  ROS:   Review of Systems  Constitution: Negative for decreased appetite, fever and weight gain.  HENT: Negative for congestion, ear discharge, hoarse voice and sore throat.   Eyes: Negative for discharge, redness, vision loss in right eye and visual halos.  Cardiovascular: Negative for chest pain, dyspnea on exertion, leg swelling, orthopnea and palpitations.  Respiratory: Negative for cough, hemoptysis, shortness of breath and snoring.   Endocrine: Negative for heat intolerance and polyphagia.  Hematologic/Lymphatic: Negative for bleeding problem. Does not bruise/bleed easily.  Skin: Negative for flushing, nail changes, rash and suspicious lesions.  Musculoskeletal: Negative for arthritis, joint pain, muscle cramps, myalgias, neck pain and stiffness.  Gastrointestinal: Negative for abdominal pain, bowel incontinence, diarrhea and excessive appetite.  Genitourinary: Negative for decreased libido, genital sores and incomplete  emptying.  Neurological: Negative for brief paralysis, focal weakness, headaches and loss of balance.  Psychiatric/Behavioral: Negative for altered mental status, depression and suicidal ideas.  Allergic/Immunologic: Negative for HIV exposure and persistent infections.    Prior CV studies:   The following studies were reviewed today:  Transthoracic echocardiogram IMPRESSIONS  1. Left ventricular ejection fraction, by estimation, is 60 to 65%. The  left  ventricle has normal function. The left ventricle has no regional  wall motion abnormalities. There is mild concentric left ventricular  hypertrophy. Left ventricular diastolic  parameters were normal.  2. Right ventricular systolic function is normal. The right ventricular  size is normal. There is normal pulmonary artery systolic pressure.  3. The mitral valve is normal in structure. Mild mitral valve  regurgitation. No evidence of mitral stenosis.  4. The aortic valve is tricuspid. Aortic valve regurgitation is not  visualized. No aortic stenosis is present.  5. The inferior vena cava is normal in size with greater than 50%  respiratory variability, suggesting right atrial pressure of 3 mmHg.   Zio  minimum heart rate was 55 bpm, maximum heart rate was 171 bpm, and average heart rate was 78 bpm. Predominant underlying rhythm was Sinus Rhythm.  1 run of Ventricular Tachycardia of occurred lasting 7 beats with a maximum rate of 146 bpm (average 132 bpm).   8 Supraventricular Tachycardia runs occurred, the run with the fastest interval lasting 5 beats with a maximum rate of 171 bpm, the longest lasting 11 beats with an averge rate of 141 bpm.  Premature atrial complexes were rare (<1.0%). Premature Ventricular complexes were rare (<1.0%).  No pauses, No AV block and no atrial fibrillation present. 21  patient triggered events: 10 associated ventricular ectopy, 1 associated with supraventricular tachycardia and the remaining associated with sinus rhythm.  Conclusion: This study is remarkable for the following:                                           1. Nonsustained ventricular tachycardia                                           2. Paroxysmal Supraventricular tachycardia which is likely atrial tachycardia with variable block.    Labs/Other Tests and Data Reviewed:    EKG:  None today  Recent Labs: 08/02/2020: ALT 20; Hemoglobin 14.2; Platelets 223; TSH  1.910 10/18/2020: BUN 18; Creatinine, Ser 0.64; Potassium 3.9; Sodium 143   Recent Lipid Panel Lab Results  Component Value Date/Time   CHOL 199 08/02/2020 11:37 AM   TRIG 103 08/02/2020 11:37 AM   HDL 53 08/02/2020 11:37 AM   CHOLHDL 3.8 08/02/2020 11:37 AM   LDLCALC 127 (H) 08/02/2020 11:37 AM    Wt Readings from Last 3 Encounters:  10/31/20 150 lb (68 kg)  10/02/20 153 lb 12.8 oz (69.8 kg)  08/22/20 154 lb 6.4 oz (70 kg)     Objective:    Vital Signs:  BP 119/62   Ht 5\' 1"  (1.549 m)   Wt 150 lb (68 kg)   BMI 28.34 kg/m    Not performed virtual visit  ASSESSMENT & PLAN:    1. She has had some improvement since starting the metoprolol.  We will continue this medication at this  time.  Was able to discuss all of her testing but she and her daughter during this visit.  All of their questions has been answered.  Discussed testing results.  Continue metoprolol  COVID-19 Education: The signs and symptoms of COVID-19 were discussed with the patient and how to seek care for testing (follow up with PCP or arrange E-visit). The importance of social distancing was discussed today.  Time:   Today, I have spent 10 minutes with the patient with telehealth technology discussing the above problems.     Medication Adjustments/Labs and Tests Ordered: Current medicines are reviewed at length with the patient today.  Concerns regarding medicines are outlined above.   Tests Ordered: No orders of the defined types were placed in this encounter.   Medication Changes: No orders of the defined types were placed in this encounter.   Follow Up: As recommended  Signed, Berniece Salines, DO  11/01/2020 4:19 PM    [facility]

## 2020-11-03 ENCOUNTER — Ambulatory Visit: Payer: Medicare HMO | Admitting: Physician Assistant

## 2020-11-03 DIAGNOSIS — U071 COVID-19: Secondary | ICD-10-CM | POA: Diagnosis not present

## 2020-11-06 ENCOUNTER — Telehealth: Payer: Self-pay

## 2020-11-06 NOTE — Telephone Encounter (Signed)
If she wants something stronger and/or antibiotics she would have to do televisit with our office

## 2020-11-06 NOTE — Telephone Encounter (Signed)
Pt sts she has tried both medication and mucinex as well. If there anything else?

## 2020-11-06 NOTE — Telephone Encounter (Signed)
Pt tested positive for covid on 11/01/2019 at urgent care. Would like to know if you could call her something in for cough and she is having ear pain.

## 2020-11-06 NOTE — Telephone Encounter (Signed)
Would recommend otc tussin DM or delsym for cough Can try salt water gargles and tylenol for ear ----

## 2020-11-07 ENCOUNTER — Other Ambulatory Visit: Payer: Self-pay

## 2020-11-07 ENCOUNTER — Encounter: Payer: Self-pay | Admitting: Physician Assistant

## 2020-11-07 ENCOUNTER — Telehealth (INDEPENDENT_AMBULATORY_CARE_PROVIDER_SITE_OTHER): Payer: Medicare HMO | Admitting: Physician Assistant

## 2020-11-07 VITALS — Ht 60.0 in | Wt 149.0 lb

## 2020-11-07 DIAGNOSIS — J060 Acute laryngopharyngitis: Secondary | ICD-10-CM | POA: Diagnosis not present

## 2020-11-07 MED ORDER — AZITHROMYCIN 250 MG PO TABS: ORAL_TABLET | ORAL | 0 refills | Status: DC

## 2020-11-07 MED ORDER — PREDNISONE 20 MG PO TABS: ORAL_TABLET | ORAL | 0 refills | Status: AC

## 2020-11-07 MED ORDER — HYDROCOD POLST-CPM POLST ER 10-8 MG/5ML PO SUER
5.0000 mL | Freq: Two times a day (BID) | ORAL | 0 refills | Status: DC
Start: 2020-11-07 — End: 2021-02-15

## 2020-11-07 NOTE — Progress Notes (Signed)
Virtual Visit via Telephone Note   This visit type was conducted due to national recommendations for restrictions regarding the COVID-19 Pandemic (e.g. social distancing) in an effort to limit this patient's exposure and mitigate transmission in our community.  Due to her co-morbid illnesses, this patient is at least at moderate risk for complications without adequate follow up.  This format is felt to be most appropriate for this patient at this time.  The patient did not have access to video technology/had technical difficulties with video requiring transitioning to audio format only (telephone).  All issues noted in this document were discussed and addressed.  No physical exam could be performed with this format.  Patient verbally consented to a telehealth visit.   Date:  11/07/2020   ID:  [name], DOB 01-11-1956, MRN [id number]  Patient Location: Home Provider Location: Office  PCP:  Marianne Sofia, PA-C    Chief Complaint:  Glenford Peers symptoms/COVID  History of Present Illness:    Brittany Short is a 65 y.o. female with COVID and secondary uri symptoms Pt states that she was diagnosed with COVID on 1/3 - has been quarantined and did receive the antibody monoclonal IV infusion She has been treating her symptoms however she has had continued chest congestion and sinus pressure and cough more productive - she has had mild wheezing and has been using her albuterol  The patient does have symptoms concerning for COVID-19 infection (fever, chills, cough, or new shortness of breath).    Past Medical History:  Diagnosis Date  . GERD (gastroesophageal reflux disease)   . Major depressive disorder, single episode, moderate (HCC)   . Mild intermittent asthma with (acute) exacerbation   . Nontoxic single thyroid nodule    Past Surgical History:  Procedure Laterality Date  . CESAREAN SECTION  1982 & 1998   X2  . THYROIDECTOMY Right      Current Meds  Medication Sig  . albuterol  (VENTOLIN HFA) 108 (90 Base) MCG/ACT inhaler Inhale 2 puffs into the lungs every 4 (four) hours as needed for wheezing or shortness of breath.  Marland Kitchen azithromycin (ZITHROMAX) 250 MG tablet 2 po day one then one po days 2-5  . celecoxib (CELEBREX) 200 MG capsule Take 1 capsule (200 mg total) by mouth daily.  . chlorpheniramine-HYDROcodone (TUSSIONEX PENNKINETIC ER) 10-8 MG/5ML SUER Take 5 mLs by mouth 2 (two) times daily.  . Cholecalciferol 50 MCG (2000 UT) CAPS Take by mouth.  . fluticasone (FLONASE) 50 MCG/ACT nasal spray Place 2 sprays into both nostrils daily.  . metoprolol succinate (TOPROL XL) 25 MG 24 hr tablet Take 0.5 tablets (12.5 mg total) by mouth daily.  . montelukast (SINGULAIR) 10 MG tablet Take 10 mg by mouth daily.  . pantoprazole (PROTONIX) 40 MG tablet Take 1 tablet (40 mg total) by mouth daily.  . predniSONE (DELTASONE) 20 MG tablet Take 3 tablets (60 mg total) by mouth daily with breakfast for 3 days, THEN 2 tablets (40 mg total) daily with breakfast for 3 days, THEN 1 tablet (20 mg total) daily with breakfast for 3 days.  Marland Kitchen venlafaxine XR (EFFEXOR-XR) 37.5 MG 24 hr capsule Take 1 capsule (37.5 mg total) by mouth daily.     Allergies:   Patient has no known allergies.   Social History   Tobacco Use  . Smoking status: Never Smoker  . Smokeless tobacco: Never Used  Vaping Use  . Vaping Use: Never used  Substance Use Topics  . Alcohol use: Not Currently  .  Drug use: Never     Family Hx: The patient's family history includes COPD in her brother and mother; Depression in her sister; Heart attack in her father; Lung cancer in her brother and mother; Suicidality in her sister.  ROS:   Please see the history of present illness.    All other systems reviewed and are negative.  Labs/Other Tests and Data Reviewed:    Recent Labs: 08/02/2020: ALT 20; Hemoglobin 14.2; Platelets 223; TSH 1.910 10/18/2020: BUN 18; Creatinine, Ser 0.64; Potassium 3.9; Sodium 143   Recent Lipid  Panel Lab Results  Component Value Date/Time   CHOL 199 08/02/2020 11:37 AM   TRIG 103 08/02/2020 11:37 AM   HDL 53 08/02/2020 11:37 AM   CHOLHDL 3.8 08/02/2020 11:37 AM   LDLCALC 127 (H) 08/02/2020 11:37 AM    Wt Readings from Last 3 Encounters:  11/07/20 149 lb (67.6 kg)  10/31/20 150 lb (68 kg)  10/02/20 153 lb 12.8 oz (69.8 kg)     Objective:    Vital Signs:  Ht 5' (1.524 m)   Wt 149 lb (67.6 kg)   BMI 29.10 kg/m    VITAL SIGNS:  reviewed  ASSESSMENT & PLAN:    1. COVID - recommend to continue to quarantine, rest, fluids 2. URI - rx for zpack, tussionex and prednisone --- follow up if any symptoms persist or worsen  COVID-19 Education: The signs and symptoms of COVID-19 were discussed with the patient and how to seek care for testing (follow up with PCP or arrange E-visit). The importance of social distancing was discussed today.  Time:   Today, I have spent 10 minutes with the patient with telehealth technology discussing the above problems.   Whitney Wright cma obtained history and vitals  Medication Adjustments/Labs and Tests Ordered: Current medicines are reviewed at length with the patient today.  Concerns regarding medicines are outlined above.   Tests Ordered: No orders of the defined types were placed in this encounter.   Medication Changes: Meds ordered this encounter  Medications  . azithromycin (ZITHROMAX) 250 MG tablet    Sig: 2 po day one then one po days 2-5    Dispense:  6 tablet    Refill:  0    Order Specific Question:   Supervising Provider    AnswerCorey Harold  . chlorpheniramine-HYDROcodone (TUSSIONEX PENNKINETIC ER) 10-8 MG/5ML SUER    Sig: Take 5 mLs by mouth 2 (two) times daily.    Dispense:  140 mL    Refill:  0    Order Specific Question:   Supervising Provider    AnswerBlane Ohara [id number]  . predniSONE (DELTASONE) 20 MG tablet    Sig: Take 3 tablets (60 mg total) by mouth daily with breakfast for 3 days,  THEN 2 tablets (40 mg total) daily with breakfast for 3 days, THEN 1 tablet (20 mg total) daily with breakfast for 3 days.    Dispense:  18 tablet    Refill:  0    Order Specific Question:   Supervising Provider    AnswerCorey Harold    Follow Up:  Virtual Visit  prn  Signed, Vickey Sages, PA-C  11/07/2020 10:44 AM    [facility]

## 2020-11-08 ENCOUNTER — Other Ambulatory Visit: Payer: Self-pay | Admitting: Physician Assistant

## 2020-11-08 DIAGNOSIS — R059 Cough, unspecified: Secondary | ICD-10-CM

## 2020-11-08 MED ORDER — BENZONATATE 100 MG PO CAPS: 100.0000 mg | ORAL_CAPSULE | Freq: Three times a day (TID) | ORAL | 0 refills | Status: DC

## 2020-11-23 ENCOUNTER — Ambulatory Visit: Payer: Medicare HMO | Admitting: Cardiology

## 2020-12-08 ENCOUNTER — Other Ambulatory Visit: Payer: Self-pay | Admitting: Physician Assistant

## 2020-12-25 DIAGNOSIS — Z1211 Encounter for screening for malignant neoplasm of colon: Secondary | ICD-10-CM | POA: Diagnosis not present

## 2020-12-25 LAB — COLOGUARD: Cologuard: NEGATIVE

## 2021-01-01 ENCOUNTER — Ambulatory Visit: Payer: Medicare HMO | Admitting: Cardiology

## 2021-01-02 LAB — COLOGUARD: COLOGUARD: NEGATIVE

## 2021-01-03 ENCOUNTER — Encounter: Payer: Self-pay | Admitting: Physician Assistant

## 2021-01-09 ENCOUNTER — Other Ambulatory Visit: Payer: Self-pay | Admitting: Cardiology

## 2021-01-09 NOTE — Telephone Encounter (Signed)
Metoprolol approved and sent 

## 2021-01-11 ENCOUNTER — Telehealth: Payer: Self-pay | Admitting: Cardiology

## 2021-01-11 NOTE — Telephone Encounter (Signed)
Spoke with pharmacy and the e script did not do as it usually does at the pharmacy she was able to pull it and will fill it right now.  She apologized for the delay.  I called the patient to let her know and she did not answer so I left a voice mail to return my call.

## 2021-01-11 NOTE — Telephone Encounter (Signed)
*  STAT* If patient is at the pharmacy, call can be transferred to refill team.   1. Which medications need to be refilled? (please list name of each medication and dose if known) Metoprolol- pt said she been to the Pharmacy  2 times and it is not there  2. Which pharmacy/location (including street and city if local pharmacy) is medication to be sent to? [facility]- [place]  3. Do they need a 30 day or 90 day supply?  60 and refills

## 2021-02-15 ENCOUNTER — Ambulatory Visit (INDEPENDENT_AMBULATORY_CARE_PROVIDER_SITE_OTHER): Payer: Medicare HMO | Admitting: Physician Assistant

## 2021-02-15 ENCOUNTER — Encounter: Payer: Self-pay | Admitting: Physician Assistant

## 2021-02-15 ENCOUNTER — Other Ambulatory Visit: Payer: Self-pay

## 2021-02-15 VITALS — BP 112/68 | HR 72 | Temp 96.9°F | Ht 60.0 in | Wt 152.0 lb

## 2021-02-15 DIAGNOSIS — K219 Gastro-esophageal reflux disease without esophagitis: Secondary | ICD-10-CM

## 2021-02-15 DIAGNOSIS — J4521 Mild intermittent asthma with (acute) exacerbation: Secondary | ICD-10-CM | POA: Diagnosis not present

## 2021-02-15 DIAGNOSIS — I472 Ventricular tachycardia: Secondary | ICD-10-CM | POA: Diagnosis not present

## 2021-02-15 DIAGNOSIS — E041 Nontoxic single thyroid nodule: Secondary | ICD-10-CM

## 2021-02-15 DIAGNOSIS — R251 Tremor, unspecified: Secondary | ICD-10-CM

## 2021-02-15 DIAGNOSIS — I4729 Other ventricular tachycardia: Secondary | ICD-10-CM

## 2021-02-15 DIAGNOSIS — Z1231 Encounter for screening mammogram for malignant neoplasm of breast: Secondary | ICD-10-CM | POA: Diagnosis not present

## 2021-02-15 DIAGNOSIS — E559 Vitamin D deficiency, unspecified: Secondary | ICD-10-CM | POA: Diagnosis not present

## 2021-02-15 DIAGNOSIS — F321 Major depressive disorder, single episode, moderate: Secondary | ICD-10-CM

## 2021-02-15 HISTORY — DX: Tremor, unspecified: R25.1

## 2021-02-15 HISTORY — DX: Encounter for screening mammogram for malignant neoplasm of breast: Z12.31

## 2021-02-15 MED ORDER — ALBUTEROL SULFATE HFA 108 (90 BASE) MCG/ACT IN AERS: 2.0000 | INHALATION_SPRAY | RESPIRATORY_TRACT | 2 refills | Status: AC | PRN

## 2021-02-15 MED ORDER — PANTOPRAZOLE SODIUM 40 MG PO TBEC
40.0000 mg | DELAYED_RELEASE_TABLET | Freq: Every day | ORAL | 1 refills | Status: DC
Start: 2021-02-15 — End: 2021-12-11

## 2021-02-15 MED ORDER — VENLAFAXINE HCL ER 37.5 MG PO CP24: ORAL_CAPSULE | ORAL | 1 refills | Status: DC

## 2021-02-15 NOTE — Progress Notes (Signed)
Established Patient Office Visit  Subjective:  [name], female    DOB: 15-Dec-1955  Age: 65 y.o. MRN: [id number]  CC:  Chief Complaint  Patient presents with  . Gastroesophageal Reflux  . Hypertension  . Depression    HPI Brittany Short presents for chronic follow up  Pt is now seeing cardiology and diagnosed with PAT - currently on toprol XL 25mg  1/2 qd and that is controlling symptoms well - states she is unsure when her next appt is  Pt with history of anxiety - symptoms are stable on effexor xr 37.5mg  and voices no concerns or problems  Pt with history of GERD - symptoms stable on protonix 40mg  qd - no breakthrough nausea, vomiting or pain  Pt with history of arthritis - symptoms stable on celebrex 200mg  qd- she states that she intermittently takes this medication because of her history of GERD  Pt states since she had COVID in 2020 she has had a slight tremor - she had COVID again first of this year and noted her tremors can be good on some days and others worse --- she has now been placed on toprol (which at times patients are placed on atenolol for tremors) She will let know if tremors worsen and if so would recommend neurology referral  Past Medical History:  Diagnosis Date  . GERD (gastroesophageal reflux disease)   . Major depressive disorder, single episode, moderate (HCC)   . Mild intermittent asthma with (acute) exacerbation   . Nontoxic single thyroid nodule     Past Surgical History:  Procedure Laterality Date  . CESAREAN SECTION  1982 & 1998   X2  . THYROIDECTOMY Right     Family History  Problem Relation Age of Onset  . Lung cancer Mother   . COPD Mother   . Heart attack Father   . Depression Sister   . Suicidality Sister   . Lung cancer Brother   . COPD Brother     Social History   Socioeconomic History  . Marital status: Married    Spouse name: Not on file  . Number of children: 2  . Years of education: Not on file   . Highest education level: Not on file  Occupational History  . Not on file  Tobacco Use  . Smoking status: Never Smoker  . Smokeless tobacco: Never Used  Vaping Use  . Vaping Use: Never used  Substance and Sexual Activity  . Alcohol use: Not Currently  . Drug use: Never  . Sexual activity: Not on file  Other Topics Concern  . Not on file  Social History Narrative  . Not on file   Social Determinants of Health   Financial Resource Strain: Not on file  Food Insecurity: Not on file  Transportation Needs: Not on file  Physical Activity: Not on file  Stress: Not on file  Social Connections: Not on file  Intimate Partner Violence: Not on file     Current Outpatient Medications:  .  albuterol (VENTOLIN HFA) 108 (90 Base) MCG/ACT inhaler, Inhale 2 puffs into the lungs every 4 (four) hours as needed for wheezing or shortness of breath., Disp: 18 g, Rfl: 2 .  celecoxib (CELEBREX) 200 MG capsule, Take 1 capsule (200 mg total) by mouth daily., Disp: 90 capsule, Rfl: 0 .  Cholecalciferol 50 MCG (2000 UT) CAPS, Take by mouth., Disp: , Rfl:  .  fluticasone (FLONASE) 50 MCG/ACT nasal spray, Place 2 sprays into both nostrils daily.,  Disp: 16 g, Rfl: 3 .  metoprolol succinate (TOPROL-XL) 25 MG 24 hr tablet, TAKE 1/2 TABLET (12.5 MG) BY MOUTH ONCE DAILY., Disp: 15 tablet, Rfl: 2 .  montelukast (SINGULAIR) 10 MG tablet, Take 10 mg by mouth daily., Disp: , Rfl:  .  pantoprazole (PROTONIX) 40 MG tablet, Take 1 tablet (40 mg total) by mouth daily., Disp: 90 tablet, Rfl: 1 .  venlafaxine XR (EFFEXOR-XR) 37.5 MG 24 hr capsule, TAKE 1 CAPSULE BY MOUTH EVERY DAY, Disp: 90 capsule, Rfl: 1   No Known Allergies  ROS CONSTITUTIONAL: Negative for chills, fatigue, fever, unintentional weight gain and unintentional weight loss.  E/N/T: Negative for ear pain, nasal congestion and sore throat.  CARDIOVASCULAR: Negative for chest pain, dizziness, palpitations and pedal edema.  RESPIRATORY: Negative for  recent cough and dyspnea.  GASTROINTESTINAL: Negative for abdominal pain, acid reflux symptoms, constipation, diarrhea, nausea and vomiting.  MSK: Negative for arthralgias and myalgias.  INTEGUMENTARY: Negative for rash.  NEUROLOGICAL: see HPI PSYCHIATRIC: Negative for sleep disturbance and to question depression screen.  Negative for depression, negative for anhedonia.        Objective:    PHYSICAL EXAM:   VS: BP 112/68   Pulse 72   Temp (!) 96.9 F (36.1 C)   Ht 5' (1.524 m)   Wt 152 lb (68.9 kg)   SpO2 99%   BMI 29.69 kg/m   PHYSICAL EXAM:   VS: BP 112/68   Pulse 72   Temp (!) 96.9 F (36.1 C)   Ht 5' (1.524 m)   Wt 152 lb (68.9 kg)   SpO2 99%   BMI 29.69 kg/m   GEN: Well nourished, well developed, in no acute distress  Cardiac: RRR; no murmurs, rubs, or gallops,no edema -  Respiratory:  normal respiratory rate and pattern with no distress - normal breath sounds with no rales, rhonchi, wheezes or rubs Neuro:  Alert and Oriented x 3, Strength and sensation are intact - CN II-Xii grossly intact - very slight hand tremor noted today Psych: euthymic mood, appropriate affect and demeanor   BP 112/68   Pulse 72   Temp (!) 96.9 F (36.1 C)   Ht 5' (1.524 m)   Wt 152 lb (68.9 kg)   SpO2 99%   BMI 29.69 kg/m  Wt Readings from Last 3 Encounters:  02/15/21 152 lb (68.9 kg)  11/07/20 149 lb (67.6 kg)  10/31/20 150 lb (68 kg)     Health Maintenance Due  Topic Date Due  . TETANUS/TDAP  Never done  . MAMMOGRAM  01/16/2021    There are no preventive care reminders to display for this patient.  Lab Results  Component Value Date   TSH 1.910 08/02/2020   Lab Results  Component Value Date   WBC 6.2 08/02/2020   HGB 14.2 08/02/2020   HCT 41.8 08/02/2020   MCV 90 08/02/2020   PLT 223 08/02/2020   Lab Results  Component Value Date   NA 143 10/18/2020   K 3.9 10/18/2020   CO2 25 10/18/2020   GLUCOSE 90 10/18/2020   BUN 18 10/18/2020   CREATININE 0.64  10/18/2020   BILITOT 0.3 08/02/2020   ALKPHOS 81 08/02/2020   AST 21 08/02/2020   ALT 20 08/02/2020   PROT 6.9 08/02/2020   ALBUMIN 4.4 08/02/2020   CALCIUM 8.9 10/18/2020   Lab Results  Component Value Date   CHOL 199 08/02/2020   Lab Results  Component Value Date   HDL 53 08/02/2020  Lab Results  Component Value Date   LDLCALC 127 (H) 08/02/2020   Lab Results  Component Value Date   TRIG 103 08/02/2020   Lab Results  Component Value Date   CHOLHDL 3.8 08/02/2020   No results found for: HGBA1C    Assessment & Plan:   Problem List Items Addressed This Visit      Cardiovascular and Mediastinum   NSVT (nonsustained ventricular tachycardia) (HCC) - Primary   Relevant Orders   CBC with Differential/Platelet   Comprehensive metabolic panel   Lipid panel Continue current med and follow up with cardiology as directed     Digestive   GERD (gastroesophageal reflux disease)   Relevant Medications   pantoprazole (PROTONIX) 40 MG tablet     Endocrine   Nontoxic single thyroid nodule   Relevant Orders   TSH     Other   Major depressive disorder, single episode, moderate (HCC)   Relevant Medications   venlafaxine XR (EFFEXOR-XR) 37.5 MG 24 hr capsule   Tremor   Encounter for screening mammogram for breast cancer   Relevant Orders   MM Digital Screening    Other Visit Diagnoses    Mild intermittent asthma with acute exacerbation       Relevant Medications   albuterol (VENTOLIN HFA) 108 (90 Base) MCG/ACT inhaler   Vitamin D insufficiency       Relevant Orders   VITAMIN D 25 Hydroxy (Vit-D Deficiency, Fractures)      Meds ordered this encounter  Medications  . albuterol (VENTOLIN HFA) 108 (90 Base) MCG/ACT inhaler    Sig: Inhale 2 puffs into the lungs every 4 (four) hours as needed for wheezing or shortness of breath.    Dispense:  18 g    Refill:  2    Order Specific Question:   Supervising Provider    AnswerBlane Ohara [id number]  . pantoprazole  (PROTONIX) 40 MG tablet    Sig: Take 1 tablet (40 mg total) by mouth daily.    Dispense:  90 tablet    Refill:  1    Order Specific Question:   Supervising Provider    AnswerBlane Ohara [id number]  . venlafaxine XR (EFFEXOR-XR) 37.5 MG 24 hr capsule    Sig: TAKE 1 CAPSULE BY MOUTH EVERY DAY    Dispense:  90 capsule    Refill:  1    Order Specific Question:   Supervising Provider    AnswerCorey Harold   More than 30 minutes of time spent with patient for assessment/treatment Follow-up: Return in about 6 months (around 08/17/2021) for chronic fasting follow up.    SARA R Karynn Deblasi, PA-C

## 2021-02-16 ENCOUNTER — Other Ambulatory Visit: Payer: Self-pay | Admitting: Physician Assistant

## 2021-02-16 DIAGNOSIS — E559 Vitamin D deficiency, unspecified: Secondary | ICD-10-CM

## 2021-02-16 LAB — COMPREHENSIVE METABOLIC PANEL
ALT: 19 IU/L (ref 0–32)
AST: 18 IU/L (ref 0–40)
Albumin/Globulin Ratio: 1.7 (ref 1.2–2.2)
Albumin: 4.3 g/dL (ref 3.8–4.8)
Alkaline Phosphatase: 85 IU/L (ref 44–121)
BUN/Creatinine Ratio: 19 (ref 12–28)
BUN: 13 mg/dL (ref 8–27)
Bilirubin Total: 0.3 mg/dL (ref 0.0–1.2)
CO2: 25 mmol/L (ref 20–29)
Calcium: 9.3 mg/dL (ref 8.7–10.3)
Chloride: 102 mmol/L (ref 96–106)
Creatinine, Ser: 0.68 mg/dL (ref 0.57–1.00)
Globulin, Total: 2.6 g/dL (ref 1.5–4.5)
Glucose: 97 mg/dL (ref 65–99)
Potassium: 4.7 mmol/L (ref 3.5–5.2)
Sodium: 142 mmol/L (ref 134–144)
Total Protein: 6.9 g/dL (ref 6.0–8.5)
eGFR: 96 mL/min/{1.73_m2} (ref >59)

## 2021-02-16 LAB — CBC WITH DIFFERENTIAL/PLATELET
Basophils Absolute: 0 10*3/uL (ref 0.0–0.2)
Basos: 0 %
EOS (ABSOLUTE): 0.2 10*3/uL (ref 0.0–0.4)
Eos: 3 %
Hematocrit: 42.9 % (ref 34.0–46.6)
Hemoglobin: 14.7 g/dL (ref 11.1–15.9)
Immature Grans (Abs): 0 10*3/uL (ref 0.0–0.1)
Immature Granulocytes: 0 %
Lymphocytes Absolute: 2.5 10*3/uL (ref 0.7–3.1)
Lymphs: 35 %
MCH: 30.9 pg (ref 26.6–33.0)
MCHC: 34.3 g/dL (ref 31.5–35.7)
MCV: 90 fL (ref 79–97)
Monocytes Absolute: 0.5 10*3/uL (ref 0.1–0.9)
Monocytes: 6 %
Neutrophils Absolute: 4 10*3/uL (ref 1.4–7.0)
Neutrophils: 56 %
Platelets: 273 10*3/uL (ref 150–450)
RBC: 4.75 x10E6/uL (ref 3.77–5.28)
RDW: 12.1 % (ref 11.7–15.4)
WBC: 7.2 10*3/uL (ref 3.4–10.8)

## 2021-02-16 LAB — LIPID PANEL
Chol/HDL Ratio: 4.2 ratio (ref 0.0–4.4)
Cholesterol, Total: 193 mg/dL (ref 100–199)
HDL: 46 mg/dL (ref >39)
LDL Chol Calc (NIH): 118 mg/dL — ABNORMAL HIGH (ref 0–99)
Triglycerides: 163 mg/dL — ABNORMAL HIGH (ref 0–149)
VLDL Cholesterol Cal: 29 mg/dL (ref 5–40)

## 2021-02-16 LAB — VITAMIN D 25 HYDROXY (VIT D DEFICIENCY, FRACTURES): Vit D, 25-Hydroxy: 14.2 ng/mL — ABNORMAL LOW (ref 30.0–100.0)

## 2021-02-16 LAB — TSH: TSH: 2.13 u[IU]/mL (ref 0.450–4.500)

## 2021-02-16 LAB — CARDIOVASCULAR RISK ASSESSMENT

## 2021-02-16 MED ORDER — VITAMIN D (ERGOCALCIFEROL) 1.25 MG (50000 UNIT) PO CAPS
50000.0000 [IU] | ORAL_CAPSULE | ORAL | 6 refills | Status: DC
Start: 2021-02-16 — End: 2021-08-17

## 2021-03-08 ENCOUNTER — Other Ambulatory Visit: Payer: Self-pay

## 2021-03-08 ENCOUNTER — Encounter: Payer: Self-pay | Admitting: Physician Assistant

## 2021-03-08 ENCOUNTER — Other Ambulatory Visit: Payer: Self-pay | Admitting: Physician Assistant

## 2021-03-08 ENCOUNTER — Ambulatory Visit (INDEPENDENT_AMBULATORY_CARE_PROVIDER_SITE_OTHER): Payer: Medicare HMO | Admitting: Physician Assistant

## 2021-03-08 VITALS — BP 134/64 | HR 87 | Temp 96.4°F | Ht 60.0 in | Wt 154.0 lb

## 2021-03-08 DIAGNOSIS — S8011XA Contusion of right lower leg, initial encounter: Secondary | ICD-10-CM | POA: Diagnosis not present

## 2021-03-08 MED ORDER — CELECOXIB 200 MG PO CAPS: 200.0000 mg | ORAL_CAPSULE | Freq: Every day | ORAL | 0 refills | Status: DC

## 2021-03-08 NOTE — Progress Notes (Signed)
Acute Office Visit  Subjective:    [name], female    DOB: Jan 14, 1956, 65 y.o.   MRN: [id number]  Chief Complaint  Patient presents with  . Fall    HPI Patient is in today for hematoma on right lower leg - she states she fell between 2 boards about 20 days ago and has large bruise on her anterior left leg - it has gotten significantly better but wanted checked since she was still having some soreness Denies calf pain or leg edema  Past Medical History:  Diagnosis Date  . GERD (gastroesophageal reflux disease)   . Major depressive disorder, single episode, moderate ([place])   . Mild intermittent asthma with (acute) exacerbation   . Nontoxic single thyroid nodule     Past Surgical History:  Procedure Laterality Date  . [place]  . THYROIDECTOMY Right     Family History  Problem Relation Age of Onset  . Lung cancer Mother   . COPD Mother   . Heart attack Father   . Depression Sister   . Suicidality Sister   . Lung cancer Brother   . COPD Brother     Social History   Socioeconomic History  . Marital status: Married    Spouse name: Not on file  . Number of children: 2  . Years of education: Not on file  . Highest education level: Not on file  Occupational History  . Not on file  Tobacco Use  . Smoking status: Never Smoker  . Smokeless tobacco: Never Used  Vaping Use  . Vaping Use: Never used  Substance and Sexual Activity  . Alcohol use: Not Currently  . Drug use: Never  . Sexual activity: Not on file  Other Topics Concern  . Not on file  Social History Narrative  . Not on file   Social Determinants of Health   Financial Resource Strain: Not on file  Food Insecurity: Not on file  Transportation Needs: Not on file  Physical Activity: Not on file  Stress: Not on file  Social Connections: Not on file  Intimate Partner Violence: Not on file    Outpatient Medications Prior to Visit  Medication Sig Dispense  Refill  . albuterol (VENTOLIN HFA) 108 (90 Base) MCG/ACT inhaler Inhale 2 puffs into the lungs every 4 (four) hours as needed for wheezing or shortness of breath. 18 g 2  . celecoxib (CELEBREX) 200 MG capsule Take 1 capsule (200 mg total) by mouth daily. 90 capsule 0  . Cholecalciferol 50 MCG (2000 UT) CAPS Take by mouth.    . fluticasone (FLONASE) 50 MCG/ACT nasal spray Place 2 sprays into both nostrils daily. 16 g 3  . metoprolol succinate (TOPROL-XL) 25 MG 24 hr tablet TAKE 1/2 TABLET (12.5 MG) BY MOUTH ONCE DAILY. 15 tablet 2  . montelukast (SINGULAIR) 10 MG tablet Take 10 mg by mouth daily.    . pantoprazole (PROTONIX) 40 MG tablet Take 1 tablet (40 mg total) by mouth daily. 90 tablet 1  . venlafaxine XR (EFFEXOR-XR) 37.5 MG 24 hr capsule TAKE 1 CAPSULE BY MOUTH EVERY DAY 90 capsule 1  . Vitamin D, Ergocalciferol, (DRISDOL) 1.25 MG (50000 UNIT) CAPS capsule Take 1 capsule (50,000 Units total) by mouth every 7 (seven) days. 5 capsule 6   No facility-administered medications prior to visit.    No Known Allergies  Review of Systems CONSTITUTIONAL: Negative for chills, fatigue, fever, unintentional weight gain and  unintentional weight loss.  CARDIOVASCULAR: Negative for chest pain, dizziness, palpitations and pedal edema.  RESPIRATORY: Negative for recent cough and dyspnea.   MSK: see HPI INTEGUMENTARY: Negative for rash.       Objective:    Physical Exam  BP 134/64 (BP Location: Right Arm, Patient Position: Sitting, Cuff Size: Normal)   Pulse 87   Temp (!) 96.4 F (35.8 C) (Temporal)   Ht 5' (1.524 m)   Wt 154 lb (69.9 kg)   SpO2 94%   BMI 30.08 kg/m  Wt Readings from Last 3 Encounters:  03/08/21 154 lb (69.9 kg)  02/15/21 152 lb (68.9 kg)  11/07/20 149 lb (67.6 kg)   PHYSICAL EXAM:   VS: BP 134/64 (BP Location: Right Arm, Patient Position: Sitting, Cuff Size: Normal)   Pulse 87   Temp (!) 96.4 F (35.8 C) (Temporal)   Ht 5' (1.524 m)   Wt 154 lb (69.9 kg)   SpO2  94%   BMI 30.08 kg/m   GEN: Well nourished, well developed, in no acute distress  Cardiac: RRR; no murmurs, rubs, or gallops,no edema - Respiratory:  normal respiratory rate and pattern with no distress - normal breath sounds with no rales, rhonchi, wheezes or rubs MS: no deformity or atrophy  Skin:hematoma on anterior lower right leg  Health Maintenance Due  Topic Date Due  . TETANUS/TDAP  Never done  . MAMMOGRAM  01/16/2021    There are no preventive care reminders to display for this patient.   Lab Results  Component Value Date   TSH 2.130 02/15/2021   Lab Results  Component Value Date   WBC 7.2 02/15/2021   HGB 14.7 02/15/2021   HCT 42.9 02/15/2021   MCV 90 02/15/2021   PLT 273 02/15/2021   Lab Results  Component Value Date   NA 142 02/15/2021   K 4.7 02/15/2021   CO2 25 02/15/2021   GLUCOSE 97 02/15/2021   BUN 13 02/15/2021   CREATININE 0.68 02/15/2021   BILITOT 0.3 02/15/2021   ALKPHOS 85 02/15/2021   AST 18 02/15/2021   ALT 19 02/15/2021   PROT 6.9 02/15/2021   ALBUMIN 4.3 02/15/2021   CALCIUM 9.3 02/15/2021   EGFR 96 02/15/2021   Lab Results  Component Value Date   CHOL 193 02/15/2021   Lab Results  Component Value Date   HDL 46 02/15/2021   Lab Results  Component Value Date   LDLCALC 118 (H) 02/15/2021   Lab Results  Component Value Date   TRIG 163 (H) 02/15/2021   Lab Results  Component Value Date   CHOLHDL 4.2 02/15/2021   No results found for: HGBA1C     Assessment & Plan:  1. Traumatic hematoma of right lower leg, initial encounter  recommend continue to elevated as needed  No orders of the defined types were placed in this encounter.   No orders of the defined types were placed in this encounter.   Follow-up: No follow-ups on file.  An After Visit Summary was printed and given to the patient.  Yetta Flock [facility] [phone]

## 2021-03-12 ENCOUNTER — Telehealth: Payer: Self-pay

## 2021-03-12 DIAGNOSIS — Z006 Encounter for examination for normal comparison and control in clinical research program: Secondary | ICD-10-CM

## 2021-03-12 NOTE — Telephone Encounter (Signed)
I called patient for her 90-day Identify Study follow up phone call. Patient is doing well with no cardiac symptoms at this time. I reminded patient I would call her in Dec. for her 1 year follow-up. 

## 2021-03-14 DIAGNOSIS — L03115 Cellulitis of right lower limb: Secondary | ICD-10-CM | POA: Diagnosis not present

## 2021-03-19 ENCOUNTER — Other Ambulatory Visit: Payer: Self-pay

## 2021-03-19 ENCOUNTER — Ambulatory Visit
Admission: RE | Admit: 2021-03-19 | Discharge: 2021-03-19 | Disposition: A | Discharge status: Home / Self Care | Payer: Medicare HMO | Source: Ambulatory Visit | Attending: Physician Assistant | Admitting: Physician Assistant

## 2021-03-19 DIAGNOSIS — Z1231 Encounter for screening mammogram for malignant neoplasm of breast: Secondary | ICD-10-CM | POA: Diagnosis not present

## 2021-03-19 MED ORDER — METOPROLOL SUCCINATE ER 25 MG PO TB24: ORAL_TABLET | ORAL | 1 refills | Status: DC

## 2021-03-19 NOTE — Telephone Encounter (Signed)
Spoke with patient. Metoprolol sent to confirmed pharmacy. Patient was last seen on 10/2020.

## 2021-03-21 DIAGNOSIS — L03115 Cellulitis of right lower limb: Secondary | ICD-10-CM | POA: Diagnosis not present

## 2021-03-22 ENCOUNTER — Other Ambulatory Visit: Payer: Self-pay | Admitting: Physician Assistant

## 2021-03-22 DIAGNOSIS — N6320 Unspecified lump in the left breast, unspecified quadrant: Secondary | ICD-10-CM

## 2021-03-23 ENCOUNTER — Other Ambulatory Visit: Payer: Self-pay | Admitting: Physician Assistant

## 2021-03-23 DIAGNOSIS — R928 Other abnormal and inconclusive findings on diagnostic imaging of breast: Secondary | ICD-10-CM

## 2021-03-23 DIAGNOSIS — N6320 Unspecified lump in the left breast, unspecified quadrant: Secondary | ICD-10-CM

## 2021-03-28 NOTE — Progress Notes (Signed)
   Brittany Short has been scheduled for the following appointment:  WHAT: Diagnostic left mammogram and ultrasound WHERE: [facility] DATE: 04/19/21 TIME: 10:10 am Arrival time  Patient has been made aware.

## 2021-04-16 ENCOUNTER — Other Ambulatory Visit: Payer: Medicare HMO

## 2021-04-19 DIAGNOSIS — R922 Inconclusive mammogram: Secondary | ICD-10-CM | POA: Diagnosis not present

## 2021-04-19 DIAGNOSIS — R928 Other abnormal and inconclusive findings on diagnostic imaging of breast: Secondary | ICD-10-CM | POA: Diagnosis not present

## 2021-05-09 ENCOUNTER — Telehealth: Payer: Self-pay | Admitting: Physician Assistant

## 2021-05-09 NOTE — Chronic Care Management (AMB) (Signed)
  Chronic Care Management   Outreach Note  05/09/2021 Name: Brittany Short MRN: [id number] DOB: 1956-05-12  Referred by: Marianne Sofia, PA-C Reason for referral : No chief complaint on file.   An unsuccessful telephone outreach was attempted today. The patient was referred to the pharmacist for assistance with care management and care coordination.   Follow Up Plan:   Tatjana Dellinger Upstream Scheduler

## 2021-05-22 ENCOUNTER — Telehealth: Payer: Self-pay | Admitting: Physician Assistant

## 2021-05-22 NOTE — Chronic Care Management (AMB) (Signed)
  Chronic Care Management   Note  05/22/2021 Name: Brittany Short MRN: [id number] DOB: [birth year]  Brittany Short is a 65 y.o. year old female who is a primary care patient of Marianne Sofia, Cordelia Poche. I reached out to [name] by phone today in response to a referral sent by Ms. Steward Drone Call's PCP, Marianne Sofia, PA-C.   Ms. Denherder was given information about Chronic Care Management services today including:  CCM service includes personalized support from designated clinical staff supervised by her physician, including individualized plan of care and coordination with other care providers 24/7 contact phone numbers for assistance for urgent and routine care needs. Service will only be billed when office clinical staff spend 20 minutes or more in a month to coordinate care. Only one practitioner may furnish and bill the service in a calendar month. The patient may stop CCM services at any time (effective at the end of the month) by phone call to the office staff.   Patient agreed to services and verbal consent obtained.   Follow up plan:   Tatjana Restaurant manager, fast food

## 2021-06-24 ENCOUNTER — Telehealth: Payer: Self-pay

## 2021-06-24 NOTE — Telephone Encounter (Signed)
Called pt to schedule an telephone AWV. Left VM to call back.  KP

## 2021-07-11 ENCOUNTER — Other Ambulatory Visit: Payer: Self-pay

## 2021-07-11 ENCOUNTER — Encounter: Payer: Self-pay | Admitting: Cardiology

## 2021-07-11 ENCOUNTER — Ambulatory Visit: Payer: Medicare HMO | Admitting: Cardiology

## 2021-07-11 VITALS — BP 144/78 | HR 88 | Ht 60.0 in | Wt 161.2 lb

## 2021-07-11 DIAGNOSIS — I471 Supraventricular tachycardia: Secondary | ICD-10-CM | POA: Diagnosis not present

## 2021-07-11 DIAGNOSIS — I472 Ventricular tachycardia: Secondary | ICD-10-CM | POA: Diagnosis not present

## 2021-07-11 DIAGNOSIS — I4729 Other ventricular tachycardia: Secondary | ICD-10-CM

## 2021-07-11 MED ORDER — METOPROLOL SUCCINATE ER 25 MG PO TB24: 25.0000 mg | ORAL_TABLET | Freq: Every day | ORAL | 3 refills | Status: DC

## 2021-07-11 NOTE — Progress Notes (Signed)
Cardiology Office Note:    Date:  07/11/2021   ID:  Brittany Short, DOB 04-25-1956, MRN [id number]  PCP:  Marianne Sofia, PA-C  Cardiologist:  Thomasene Ripple, DO  Electrophysiologist:  None   Referring MD: Marianne Sofia, PA-C   " I am doing well"  History of Present Illness:    Brittany Short is a 65 y.o. female with a hx of major depressive disorder, GERD presents today.   I saw the patient on 10/02/20 at that time. During that visit I started the patient on low dose beta blocker due to Toprol 12.5mg  . She also reported chest pain therefore a CCTA was ordered.   I did see the patient on October 31, 2020 via virtual visit.  At that time she was experiencing improvement on the metoprolol with the palpitations.  She had no questions.   Today she tells me that the blood pressure has increased slightly.  Blood palpitation has improved.  Past Medical History:  Diagnosis Date   GERD (gastroesophageal reflux disease)    Major depressive disorder, single episode, moderate ([facility])    Mild intermittent asthma with (acute) exacerbation    Nontoxic single thyroid nodule     Past Surgical History:  Procedure Laterality Date   CESAREAN SECTION  1982 & 1998   X2   THYROIDECTOMY Right     Current Medications: Current Meds  Medication Sig   albuterol (VENTOLIN HFA) 108 (90 Base) MCG/ACT inhaler Inhale 2 puffs into the lungs every 4 (four) hours as needed for wheezing or shortness of breath.   celecoxib (CELEBREX) 200 MG capsule Take 1 capsule (200 mg total) by mouth daily.   Cholecalciferol 50 MCG (2000 UT) CAPS Take by mouth.   fluticasone (FLONASE) 50 MCG/ACT nasal spray Place 2 sprays into both nostrils daily.   montelukast (SINGULAIR) 10 MG tablet Take 10 mg by mouth daily.   pantoprazole (PROTONIX) 40 MG tablet Take 1 tablet (40 mg total) by mouth daily.   venlafaxine XR (EFFEXOR-XR) 37.5 MG 24 hr capsule TAKE 1 CAPSULE BY MOUTH EVERY DAY   Vitamin D, Ergocalciferol, (DRISDOL) 1.25 MG (50000  UNIT) CAPS capsule Take 1 capsule (50,000 Units total) by mouth every 7 (seven) days.   [DISCONTINUED] metoprolol succinate (TOPROL-XL) 25 MG 24 hr tablet TAKE 1/2 TABLET (12.5 MG) BY MOUTH ONCE DAILY.     Allergies:   Patient has no known allergies.   Social History   Socioeconomic History   Marital status: Married    Spouse name: Not on file   Number of children: 2   Years of education: Not on file   Highest education level: Not on file  Occupational History   Not on file  Tobacco Use   Smoking status: Never   Smokeless tobacco: Never  Vaping Use   Vaping Use: Never used  Substance and Sexual Activity   Alcohol use: Not Currently   Drug use: Never   Sexual activity: Not on file  Other Topics Concern   Not on file  Social History Narrative   Not on file   Social Determinants of Health   Financial Resource Strain: Not on file  Food Insecurity: Not on file  Transportation Needs: Not on file  Physical Activity: Not on file  Stress: Not on file  Social Connections: Not on file     Family History: The patient's family history includes COPD in her brother and mother; Depression in her sister; Heart attack in her father; Lung cancer in her brother  and mother; Suicidality in her sister. There is no history of Breast cancer.  ROS:   Review of Systems  Constitution: Negative for decreased appetite, fever and weight gain.  HENT: Negative for congestion, ear discharge, hoarse voice and sore throat.   Eyes: Negative for discharge, redness, vision loss in right eye and visual halos.  Cardiovascular: Negative for chest pain, dyspnea on exertion, leg swelling, orthopnea and palpitations.  Respiratory: Negative for cough, hemoptysis, shortness of breath and snoring.   Endocrine: Negative for heat intolerance and polyphagia.  Hematologic/Lymphatic: Negative for bleeding problem. Does not bruise/bleed easily.  Skin: Negative for flushing, nail changes, rash and suspicious lesions.   Musculoskeletal: Negative for arthritis, joint pain, muscle cramps, myalgias, neck pain and stiffness.  Gastrointestinal: Negative for abdominal pain, bowel incontinence, diarrhea and excessive appetite.  Genitourinary: Negative for decreased libido, genital sores and incomplete emptying.  Neurological: Negative for brief paralysis, focal weakness, headaches and loss of balance.  Psychiatric/Behavioral: Negative for altered mental status, depression and suicidal ideas.  Allergic/Immunologic: Negative for HIV exposure and persistent infections.    EKGs/Labs/Other Studies Reviewed:    The following studies were reviewed today:   EKG: None today  Transthoracic echocardiogram IMPRESSIONS   1. Left ventricular ejection fraction, by estimation, is 60 to 65%. The  left ventricle has normal function. The left ventricle has no regional  wall motion abnormalities. There is mild concentric left ventricular  hypertrophy. Left ventricular diastolic  parameters were normal.   2. Right ventricular systolic function is normal. The right ventricular  size is normal. There is normal pulmonary artery systolic pressure.   3. The mitral valve is normal in structure. Mild mitral valve  regurgitation. No evidence of mitral stenosis.   4. The aortic valve is tricuspid. Aortic valve regurgitation is not  visualized. No aortic stenosis is present.   5. The inferior vena cava is normal in size with greater than 50%  respiratory variability, suggesting right atrial pressure of 3 mmHg.    [place]  minimum heart rate was 55 bpm, maximum heart rate was 171 bpm, and average heart rate was 78 bpm. Predominant underlying rhythm was Sinus Rhythm.   1 run of Ventricular Tachycardia of occurred lasting 7 beats with a maximum rate of 146 bpm (average 132 bpm).    8 Supraventricular Tachycardia runs occurred, the run with the fastest interval lasting 5 beats with a maximum rate of 171 bpm, the longest lasting 11 beats  with an averge rate of 141 bpm.   Premature atrial complexes were rare (<1.0%). Premature Ventricular complexes were rare (<1.0%).   No pauses, No AV block and no atrial fibrillation present. 21  patient triggered events: 10 associated ventricular ectopy, 1 associated with supraventricular tachycardia and the remaining associated with sinus rhythm.   Conclusion: This study is remarkable for the following:                                           1. Nonsustained ventricular tachycardia                                           2. Paroxysmal Supraventricular tachycardia which is likely atrial tachycardia with variable block.  Recent Labs: 02/15/2021: ALT 19; BUN 13; Creatinine, Ser 0.68; Hemoglobin 14.7; Platelets  273; Potassium 4.7; Sodium 142; TSH 2.130  Recent Lipid Panel    Component Value Date/Time   CHOL 193 02/15/2021 1007   TRIG 163 (H) 02/15/2021 1007   HDL 46 02/15/2021 1007   CHOLHDL 4.2 02/15/2021 1007   LDLCALC 118 (H) 02/15/2021 1007    Physical Exam:    VS:  BP (!) 144/78 (BP Location: Right Arm, Patient Position: Sitting, Cuff Size: Normal)   Pulse 88   Ht 5' (1.524 m)   Wt 161 lb 3.2 oz (73.1 kg)   SpO2 96%   BMI 31.48 kg/m     Wt Readings from Last 3 Encounters:  07/11/21 161 lb 3.2 oz (73.1 kg)  03/08/21 154 lb (69.9 kg)  02/15/21 152 lb (68.9 kg)     GEN: Well nourished, well developed in no acute distress HEENT: Normal NECK: No JVD; No carotid bruits LYMPHATICS: No lymphadenopathy CARDIAC: S1S2 noted,RRR, no murmurs, rubs, gallops RESPIRATORY:  Clear to auscultation without rales, wheezing or rhonchi  ABDOMEN: Soft, non-tender, non-distended, +bowel sounds, no guarding. EXTREMITIES: No edema, No cyanosis, no clubbing MUSCULOSKELETAL:  No deformity  SKIN: Warm and dry NEUROLOGIC:  Alert and oriented x 3, non-focal PSYCHIATRIC:  Normal affect, good insight  ASSESSMENT:    1. NSVT (nonsustained ventricular tachycardia) (HCC)   2. PAT  (paroxysmal atrial tachycardia) (HCC)   3. Morbid obesity (HCC)    PLAN:    We will increase the metoprolol succinate to 25 mg daily.  I have asked the patient to continue to monitor her blood pressure and send me information in 1 week via MyChart. The patient understands the need to lose weight with diet and exercise. We have discussed specific strategies for this.  The patient is in agreement with the above plan. The patient left the office in stable condition.  The patient will follow up in 6 months    Medication Adjustments/Labs and Tests Ordered: Current medicines are reviewed at length with the patient today.  Concerns regarding medicines are outlined above.  No orders of the defined types were placed in this encounter.  Meds ordered this encounter  Medications   DISCONTD: metoprolol succinate (TOPROL-XL) 25 MG 24 hr tablet    Sig: Take 1 tablet (25 mg total) by mouth daily. TAKE 1/2 TABLET (12.5 MG) BY MOUTH ONCE DAILY.    Dispense:  90 tablet    Refill:  3   metoprolol succinate (TOPROL-XL) 25 MG 24 hr tablet    Sig: Take 1 tablet (25 mg total) by mouth daily.    Dispense:  90 tablet    Refill:  3    Patient Instructions  Medication Instructions:  Your physician has recommended you make the following change in your medication:  INCREASE: TOPROL XL 25 MG TAKE ONE TABLET BY MOUTH DAILY.  *If you need a refill on your cardiac medications before your next appointment, please call your pharmacy*   Lab Work: None If you have labs (blood work) drawn today and your tests are completely normal, you will receive your results only by: MyChart Message (if you have MyChart) OR A paper copy in the mail If you have any lab test that is abnormal or we need to change your treatment, we will call you to review the results.   Testing/Procedures: None   Follow-Up: At [facility], you and your health needs are our priority.  As part of our continuing mission to provide you with  exceptional heart care, we have created designated Provider Care  Teams.  These Care Teams include your primary Cardiologist (physician) and Advanced Practice Providers (APPs -  Physician Assistants and Nurse Practitioners) who all work together to provide you with the care you need, when you need it.  We recommend signing up for the patient portal called "MyChart".  Sign up information is provided on this After Visit Summary.  MyChart is used to connect with patients for Virtual Visits (Telemedicine).  Patients are able to view lab/test results, encounter notes, upcoming appointments, etc.  Non-urgent messages can be sent to your provider as well.   To learn more about what you can do with MyChart, go to [email or web address].    Your next appointment:   6 month(s)  The format for your next appointment:   In Person  Provider:   Dr. Servando Salina   Other Instructions    Adopting a Healthy Lifestyle.  Know what a healthy weight is for you (roughly BMI <25) and aim to maintain this   Aim for 7+ servings of fruits and vegetables daily   65-80+ fluid ounces of water or unsweet tea for healthy kidneys   Limit to max 1 drink of alcohol per day; avoid smoking/tobacco   Limit animal fats in diet for cholesterol and heart health - choose grass fed whenever available   Avoid highly processed foods, and foods high in saturated/trans fats   Aim for low stress - take time to unwind and care for your mental health   Aim for 150 min of moderate intensity exercise weekly for heart health, and weights twice weekly for bone health   Aim for 7-9 hours of sleep daily   When it comes to diets, agreement about the perfect plan isnt easy to find, even among the experts. Experts at the [facility] [employer] developed an idea known as the Healthy Eating Plate. Just imagine a plate divided into logical, healthy portions.   The emphasis is on diet quality:   Load up on vegetables and fruits -  one-half of your plate: Aim for color and variety, and remember that potatoes dont count.   Go for whole grains - one-quarter of your plate: Whole wheat, barley, wheat berries, quinoa, oats, brown rice, and foods made with them. If you want pasta, go with whole wheat pasta.   Protein power - one-quarter of your plate: Fish, chicken, beans, and nuts are all healthy, versatile protein sources. Limit red meat.   The diet, however, does go beyond the plate, offering a few other suggestions.   Use healthy plant oils, such as olive, canola, soy, corn, sunflower and peanut. Check the labels, and avoid partially hydrogenated oil, which have unhealthy trans fats.   If youre thirsty, drink water. Coffee and tea are good in moderation, but skip sugary drinks and limit milk and dairy products to one or two daily servings.   The type of carbohydrate in the diet is more important than the amount. Some sources of carbohydrates, such as vegetables, fruits, whole grains, and beans-are healthier than others.   Finally, stay active  Signed, Thomasene Ripple, DO  07/11/2021 4:55 PM    [facility]

## 2021-07-11 NOTE — Patient Instructions (Signed)
Medication Instructions:  Your physician has recommended you make the following change in your medication:  INCREASE: TOPROL XL 25 MG TAKE ONE TABLET BY MOUTH DAILY.  *If you need a refill on your cardiac medications before your next appointment, please call your pharmacy*   Lab Work: None If you have labs (blood work) drawn today and your tests are completely normal, you will receive your results only by: MyChart Message (if you have MyChart) OR A paper copy in the mail If you have any lab test that is abnormal or we need to change your treatment, we will call you to review the results.   Testing/Procedures: None   Follow-Up: At [facility], you and your health needs are our priority.  As part of our continuing mission to provide you with exceptional heart care, we have created designated Provider Care Teams.  These Care Teams include your primary Cardiologist (physician) and Advanced Practice Providers (APPs -  Physician Assistants and Nurse Practitioners) who all work together to provide you with the care you need, when you need it.  We recommend signing up for the patient portal called "MyChart".  Sign up information is provided on this After Visit Summary.  MyChart is used to connect with patients for Virtual Visits (Telemedicine).  Patients are able to view lab/test results, encounter notes, upcoming appointments, etc.  Non-urgent messages can be sent to your provider as well.   To learn more about what you can do with MyChart, go to [email or web address].    Your next appointment:   6 month(s)  The format for your next appointment:   In Person  Provider:   Dr. Servando Salina   Other Instructions

## 2021-07-14 ENCOUNTER — Telehealth: Payer: Self-pay

## 2021-07-14 NOTE — Telephone Encounter (Signed)
Called patient to schedule AWV. Unable to reach or leave a message.

## 2021-07-20 ENCOUNTER — Ambulatory Visit: Payer: Medicare HMO

## 2021-08-02 ENCOUNTER — Telehealth: Payer: Self-pay

## 2021-08-02 NOTE — Chronic Care Management (AMB) (Signed)
Chronic Care Management Pharmacy Assistant   Name: Aerin Delany  MRN: [id number] DOB: 1956-04-05  Brittany Short is an 65 y.o. year old female who presents for her initial CCM visit with the clinical pharmacist.  Reason for Encounter: Chart Prep   Conditions to be addressed/monitored: Nonsustained ventricular tachycardia, Chronic frontal sinusitis, Asthma, Gerd, Obesity, Tremors, HTN  Recent office visits:  02/16/21 Orders Only. Marianne Sofia PA-C. Ordered Vitamin D 50,000 units every 7 days for vitamin d deficiency.  02/15/21 Marianne Sofia PA-C. Seen for NSVT. D/C Azithromycin 250 mg, Benzonatate 100 mg and Tussionex 5 ml due to completing course. Follow up in 6 months.  Recent consult visits:  07/11/21 (Cardiology) Thomasene Ripple DO. Seen for NSVT. Increased metoprolol 12.5 mg daily to 25 mg daily. Follow up in 6 months.  Hospital visits:  None in previous 6 months  Medications: Outpatient Encounter Medications as of 08/02/2021  Medication Sig   albuterol (VENTOLIN HFA) 108 (90 Base) MCG/ACT inhaler Inhale 2 puffs into the lungs every 4 (four) hours as needed for wheezing or shortness of breath.   celecoxib (CELEBREX) 200 MG capsule Take 1 capsule (200 mg total) by mouth daily.   Cholecalciferol 50 MCG (2000 UT) CAPS Take by mouth.   fluticasone (FLONASE) 50 MCG/ACT nasal spray Place 2 sprays into both nostrils daily.   metoprolol succinate (TOPROL-XL) 25 MG 24 hr tablet Take 1 tablet (25 mg total) by mouth daily.   montelukast (SINGULAIR) 10 MG tablet Take 10 mg by mouth daily.   pantoprazole (PROTONIX) 40 MG tablet Take 1 tablet (40 mg total) by mouth daily.   venlafaxine XR (EFFEXOR-XR) 37.5 MG 24 hr capsule TAKE 1 CAPSULE BY MOUTH EVERY DAY   Vitamin D, Ergocalciferol, (DRISDOL) 1.25 MG (50000 UNIT) CAPS capsule Take 1 capsule (50,000 Units total) by mouth every 7 (seven) days.   No facility-administered encounter medications on file as of 08/02/2021.    No results found for:  HGBA1C, MICROALBUR   BP Readings from Last 3 Encounters:  07/11/21 (!) 144/78  03/08/21 134/64  02/15/21 112/68    Patient Questions:  Have you seen any other providers since your last visit with PCP?  Pt states she goes to the cardiologist for palpations  Any changes in your medications or health? Pt states there have not been any changes  Any side effects from any medications? No side effects that she is aware of  Do you have an symptoms or problems not managed by your medications? No other problems she noted   Any concerns about your health right now? Pt feels confident in her health and has no concerns  Has your provider asked that you check blood pressure, blood sugar, or follow special diet at home? Dr. Servando Salina has her checking her BP for two weeks and then she stopped. Running  around 140/77 range.  Do you get any type of exercise on a regular basis? Pt is walking every day for about 30 mins or longer  Can you think of a goal you would like to reach for your health? Pt could not think of a goal at this time   Do you have any problems getting your medications? She noted she has no issues getting her meds  Is there anything that you would like to discuss during the appointment? Nothing else to add    Nakesha Ebrahim was reminded to have all medications, supplements and any blood glucose and blood pressure readings available for review with Artelia Laroche  Pharm. D, at her telephone visit on 08/09/21 at 8:00 am .    Star Rating Drugs:  Medication:  Last Fill: Day Supply None noted     Care Gaps: Last annual wellness visit? None noted  If applicable: N/A Last eye exam / retinopathy screening? Last diabetic foot exam?   Roxana Hires, [facility] Clinical Pharmacist Assistant  [phone]

## 2021-08-09 ENCOUNTER — Ambulatory Visit: Payer: Medicare HMO

## 2021-08-09 ENCOUNTER — Other Ambulatory Visit: Payer: Self-pay

## 2021-08-09 DIAGNOSIS — K219 Gastro-esophageal reflux disease without esophagitis: Secondary | ICD-10-CM

## 2021-08-09 DIAGNOSIS — R251 Tremor, unspecified: Secondary | ICD-10-CM

## 2021-08-09 DIAGNOSIS — I4729 Other ventricular tachycardia: Secondary | ICD-10-CM

## 2021-08-09 DIAGNOSIS — F321 Major depressive disorder, single episode, moderate: Secondary | ICD-10-CM

## 2021-08-09 NOTE — Patient Instructions (Signed)
Visit Information   Goals Addressed             This Visit's Progress    Manage My Medicine       Timeframe:  Long-Range Goal Priority:  High Start Date:                             Expected End Date:                       Follow Up Date 08/09/22   - keep a list of all the medicines I take; vitamins and herbals too - learn to read medicine labels    Why is this important?   These steps will help you keep on track with your medicines.   Notes:      Track and Manage My Blood Pressure-Hypertension       Timeframe:  Long-Range Goal Priority:  High Start Date:                             Expected End Date:                       Follow Up Date 08/09/22   - choose a place to take my blood pressure (home, clinic or office, retail store) - write blood pressure results in a log or diary    Why is this important?   You won't feel high blood pressure, but it can still hurt your blood vessels.  High blood pressure can cause heart or kidney problems. It can also cause a stroke.  Making lifestyle changes like losing a little weight or eating less salt will help.  Checking your blood pressure at home and at different times of the day can help to control blood pressure.  If the doctor prescribes medicine remember to take it the way the doctor ordered.  Call the office if you cannot afford the medicine or if there are questions about it.     Notes:        Patient Care Plan: CCM Pharmacy Care Plan     Problem Identified: HTN, Pain,   Priority: High  Onset Date: 08/09/2021     Goal: Disease State Management   Start Date: 08/09/2021  Expected End Date: 08/09/2022  This Visit's Progress: On track  Priority: High  Note:   Current Barriers:  Does not maintain contact with provider office Does not contact provider office for questions/concerns  Pharmacist Clinical Goal(s):  Patient will contact provider office for questions/concerns as evidenced notation of same in  electronic health record through collaboration with PharmD and provider.   Interventions: 1:1 collaboration with Marianne Sofia, PA-C regarding development and update of comprehensive plan of care as evidenced by provider attestation and co-signature Inter-disciplinary care team collaboration (see longitudinal plan of care) Comprehensive medication review performed; medication list updated in electronic medical record  Hypertension (BP goal <140/90) BP Readings from Last 3 Encounters:  07/11/21 (!) 144/78  03/08/21 134/64  02/15/21 112/68  -Uncontrolled -Current treatment: Metoprolol 25mg  ER -Medications previously tried: Metoprolol 12.5mg  ER  -Current home readings: Often above 140 systolic -Current dietary habits: "Tries to eat healthy" -Current exercise habits: Walks daily -Denies hypotensive/hypertensive symptoms -Educated on BP goals and benefits of medications for prevention of heart attack, stroke and kidney damage; -Counseled to monitor BP at home Daily, document, and provide  log at future appointments October 2023: Will alert specialist and defer to them. Patient just had a death in the family so she may need anti-hypertensive medicine temporarily until her mind settles  Tremor (Goal: Decrease tremor) -Not ideally controlled -Current treatment  Venlafaxine 37.5mg  QD Metoprolol ER 25mg  -Medications previously tried: N/A  -Recommended to continue current medication   Hyperlipidemia: (LDL goal < 100) The 10-year ASCVD risk score (Arnett DK, et al., 2019) is: 8.1%   Values used to calculate the score:     Age: 65 years     Sex: Female     Is Non-Hispanic African American: No     Diabetic: No     Tobacco smoker: No     Systolic Blood Pressure: 144 mmHg     Is BP treated: No     HDL Cholesterol: 46 mg/dL     Total Cholesterol: 193 mg/dL Lab Results  Component Value Date   CHOL 193 02/15/2021   CHOL 199 08/02/2020   Lab Results  Component Value Date   HDL 46  02/15/2021   HDL 53 08/02/2020   Lab Results  Component Value Date   LDLCALC 118 (H) 02/15/2021   LDLCALC 127 (H) 08/02/2020   Lab Results  Component Value Date   TRIG 163 (H) 02/15/2021   TRIG 103 08/02/2020   Lab Results  Component Value Date   CHOLHDL 4.2 02/15/2021   CHOLHDL 3.8 08/02/2020  No results found for: LDLDIRECT -Not ideally controlled -Current treatment: None -Medications previously tried: None  -Current dietary habits: "Tries to eat healthy" -Current exercise habits: Walks daily -Educated on Cholesterol goals;  October 2022: Statin candidate due to LDL levels but ASCVD is less than 10%  Chronic Pain -Controlled -Current treatment: Celecoxib PRN -Medications previously tried: Tylenol/IBU  -Pain Scale Without Meds: 5/10  With Meds: 2/10  Aggravating Factors: Taking care of grandkids  Pain Type: Didn't specify  -Recommended to continue current medication   Patient Goals/Self-Care Activities Patient will:  - take medications as prescribed  Follow Up Plan: The patient has been provided with contact information for the care management team and has been advised to call with any health related questions or concerns.    4/10, Pharm.D. Artelia Laroche  CPP F/U July 2023     Ms. Siebel was given information about Chronic Care Management services today including:  CCM service includes personalized support from designated clinical staff supervised by her physician, including individualized plan of care and coordination with other care providers 24/7 contact phone numbers for assistance for urgent and routine care needs. Standard insurance, coinsurance, copays and deductibles apply for chronic care management only during months in which we provide at least 20 minutes of these services. Most insurances cover these services at 100%, however patients may be responsible for any copay, coinsurance and/or deductible if applicable. This service may help  you avoid the need for more expensive face-to-face services. Only one practitioner may furnish and bill the service in a calendar month. The patient may stop CCM services at any time (effective at the end of the month) by phone call to the office staff.  Patient agreed to services and verbal consent obtained.   The patient verbalized understanding of instructions, educational materials, and care plan provided today and declined offer to receive copy of patient instructions, educational materials, and care plan.  The pharmacy team will reach out to the patient again over the next 90 days.   August 2023, [facility]

## 2021-08-09 NOTE — Progress Notes (Signed)
Chronic Care Management Pharmacy Note  08/09/2021 Name:  Brittany Short MRN:  [id number] DOB:  [birth year]  Summary: -Pleasant 65 year old female presents for initial CCM visit. 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 She has 1 granddaughter and 1 grandson on the way. She tells me that she is busier taking care of them than she was when she worked full time! She also has 2 children, 56 and 59 years old. She is married and currently lives with her spouse/daughter/granddaughter.She is frustrated because she got Covid twice in a year and after the first time, she developed a tremor that won't stop  Recommendations/Changes made from today's visit: -Asked specialist to look into elevated BP readings -Patient needs an AWV  Subjective: Brittany Short is an 65 y.o. year old female who is a primary patient of Marge Duncans, [place].  The CCM team was consulted for assistance with disease management and care coordination needs.    Engaged with patient by telephone for initial visit in response to provider referral for pharmacy case management and/or care coordination services.   Consent to Services:  The patient was given the following information about Chronic Care Management services today, agreed to services, and gave verbal consent: 1. CCM service includes personalized support from designated clinical staff supervised by the primary care provider, including individualized plan of care and coordination with other care providers 2. 24/7 contact phone numbers for assistance for urgent and routine care needs. 3. Service will only be billed when office clinical staff spend 20 minutes or more in a month to coordinate care. 4. Only one practitioner may furnish and bill the service in a calendar month. 5.The patient may stop CCM services at any time (effective at the end of the month) by phone call to the office staff. 6. The patient will be  responsible for cost sharing (co-pay) of up to 20% of the service fee (after annual deductible is met). Patient agreed to services and consent obtained.  Patient Care Team: Marge Duncans, PA-C as PCP - General (Physician Assistant) Berniece Salines, DO as PCP - Cardiology (Cardiology) Burnice Logan, [facility] (Inactive) as Pharmacist (Pharmacist)  Recent office visits:  02/16/21 Orders Only. Marge Duncans PA-C. Ordered Vitamin D 50,000 units every 7 days for vitamin d deficiency.   02/15/21 Marge Duncans PA-C. Seen for NSVT. D/C Azithromycin 250 mg, Benzonatate 100 mg and Tussionex 5 ml due to completing course. Follow up in 6 months.   Recent consult visits:  07/11/21 (Cardiology) Berniece Salines DO. Seen for NSVT. Increased metoprolol 12.5 mg daily to 25 mg daily. Follow up in 6 months.   Hospital visits:  None in previous 6 months   Objective:  Lab Results  Component Value Date   CREATININE 0.68 02/15/2021   BUN 13 02/15/2021   GFRNONAA 94 10/18/2020   GFRAA 108 10/18/2020   NA 142 02/15/2021   K 4.7 02/15/2021   CALCIUM 9.3 02/15/2021   CO2 25 02/15/2021   GLUCOSE 97 02/15/2021    No results found for: HGBA1C, FRUCTOSAMINE, GFR, MICROALBUR  Last diabetic Eye exam: No results found for: HMDIABEYEEXA  Last diabetic Foot exam: No results found for: HMDIABFOOTEX   Lab Results  Component Value Date   CHOL 193 02/15/2021   HDL 46 02/15/2021   LDLCALC 118 (H) 02/15/2021   TRIG 163 (H) 02/15/2021   CHOLHDL 4.2 02/15/2021    Hepatic Function  Latest Ref Rng & Units 02/15/2021 08/02/2020  Total Protein 6.0 - 8.5 g/dL 6.9 6.9  Albumin 3.8 - 4.8 g/dL 4.3 4.4  AST 0 - 40 IU/L 18 21  ALT 0 - 32 IU/L 19 20  Alk Phosphatase 44 - 121 IU/L 85 81  Total Bilirubin 0.0 - 1.2 mg/dL 0.3 0.3    Lab Results  Component Value Date/Time   TSH 2.130 02/15/2021 10:07 AM   TSH 1.910 08/02/2020 11:37 AM    CBC Latest Ref Rng & Units 02/15/2021 08/02/2020  WBC 3.4 - 10.8 x10E3/uL 7.2 6.2  Hemoglobin 11.1 -  15.9 g/dL 14.7 14.2  Hematocrit 34.0 - 46.6 % 42.9 41.8  Platelets 150 - 450 x10E3/uL 273 223    Lab Results  Component Value Date/Time   VD25OH 14.2 (L) 02/15/2021 10:07 AM   VD25OH 32.2 08/02/2020 11:37 AM    Clinical ASCVD: No  The 10-year ASCVD risk score (Arnett DK, et al., 2019) is: 8.1%   Values used to calculate the score:     Age: 65 years     Sex: Female     Is Non-Hispanic African American: No     Diabetic: No     Tobacco smoker: No     Systolic Blood Pressure: 646 mmHg     Is BP treated: No     HDL Cholesterol: 46 mg/dL     Total Cholesterol: 193 mg/dL    Depression screen PHQ 2/9 02/15/2021  Decreased Interest 0  Down, Depressed, Hopeless 0  PHQ - 2 Score 0  Altered sleeping 0  Tired, decreased energy 0  Change in appetite 0  Feeling bad or failure about yourself  0  Trouble concentrating 0  Moving slowly or fidgety/restless 0  Suicidal thoughts 0  PHQ-9 Score 0  Difficult doing work/chores Not difficult at all     Other: (CHADS2VASc if Afib, MMRC or CAT for COPD, ACT, DEXA)  Social History   Tobacco Use  Smoking Status Never  Smokeless Tobacco Never   BP Readings from Last 3 Encounters:  07/11/21 (!) 144/78  03/08/21 134/64  02/15/21 112/68   Pulse Readings from Last 3 Encounters:  07/11/21 88  03/08/21 87  02/15/21 72   Wt Readings from Last 3 Encounters:  07/11/21 161 lb 3.2 oz (73.1 kg)  03/08/21 154 lb (69.9 kg)  02/15/21 152 lb (68.9 kg)   BMI Readings from Last 3 Encounters:  07/11/21 31.48 kg/m  03/08/21 30.08 kg/m  02/15/21 29.69 kg/m    Assessment/Interventions: Review of patient past medical history, allergies, medications, health status, including review of consultants reports, laboratory and other test data, was performed as part of comprehensive evaluation and provision of chronic care management services.   SDOH:  (Social Determinants of Health) assessments and interventions performed: Yes SDOH Interventions     Flowsheet Row Most Recent Value  SDOH Interventions   Financial Strain Interventions Intervention Not Indicated  Transportation Interventions Intervention Not Indicated      SDOH Screenings   Alcohol Screen: Low Risk    Last Alcohol Screening Score (AUDIT): 0  Depression (PHQ2-9): Low Risk    PHQ-2 Score: 0  Financial Resource Strain: Low Risk    Difficulty of Paying Living Expenses: Not hard at all  Food Insecurity: Not on file  Housing: Not on file  Physical Activity: Not on file  Social Connections: Not on file  Stress: Not on file  Tobacco Use: Low Risk    Smoking Tobacco Use: Never  Smokeless Tobacco Use: Never  Transportation Needs: No Transportation Needs   Lack of Transportation (Medical): No   Lack of Transportation (Non-Medical): No    CCM Care Plan  No Known Allergies  Medications Reviewed Today     Reviewed by Lane Hacker, [facility] (Pharmacist) on 08/09/21 at [place] List Status: <None>   Medication Order Taking? Sig Documenting Provider Last Dose Status Informant  albuterol (VENTOLIN HFA) 108 (90 Base) MCG/ACT inhaler [id number] Yes Inhale 2 puffs into the lungs every 4 (four) hours as needed for wheezing or shortness of breath. Marge Duncans, PA-C Taking Active   celecoxib (CELEBREX) 200 MG capsule [id number] Yes Take 1 capsule (200 mg total) by mouth daily. Marge Duncans, PA-C Taking Active   Cholecalciferol 50 MCG (2000 UT) CAPS [id number] Yes Take by mouth. [provider] Taking Active   fluticasone (FLONASE) 50 MCG/ACT nasal spray [id number] Yes Place 2 sprays into both nostrils daily. Ivy Lynn, NP Taking Active   metoprolol succinate (TOPROL-XL) 25 MG 24 hr tablet [id number] Yes Take 1 tablet (25 mg total) by mouth daily. Tobb, Kardie, DO Taking Active   montelukast (SINGULAIR) 10 MG tablet [id number] Yes Take 10 mg by mouth daily. [provider] Taking Active   pantoprazole (PROTONIX) 40 MG tablet [id number] Yes Take 1 tablet (40  mg total) by mouth daily. Marge Duncans, PA-C Taking Active   venlafaxine XR (EFFEXOR-XR) 37.5 MG 24 hr capsule [id number] Yes TAKE 1 CAPSULE BY MOUTH EVERY DAY Marge Duncans, PA-C Taking Active   Vitamin D, Ergocalciferol, (DRISDOL) 1.25 MG (50000 UNIT) CAPS capsule [id number] Yes Take 1 capsule (50,000 Units total) by mouth every 7 (seven) days. Marge Duncans, PA-C Taking Active             Patient Active Problem List   Diagnosis Date Noted   Traumatic hematoma of right lower leg 03/08/2021   Tremor 02/15/2021   Encounter for screening mammogram for breast cancer 02/15/2021   NSVT (nonsustained ventricular tachycardia) 10/02/2020   PAT (paroxysmal atrial tachycardia) ([place]) 10/02/2020   Chest pain of uncertain etiology 09/38/1829   Morbid obesity ([place]) 10/02/2020   Colon cancer screening 08/02/2020   Need for prophylactic vaccination against Streptococcus pneumoniae (pneumococcus) 08/02/2020   Need for prophylactic vaccination and inoculation against influenza 08/02/2020   Palpitations 08/02/2020   UTI (urinary tract infection) 03/06/2020   Nontoxic single thyroid nodule    Mild intermittent asthma with (acute) exacerbation    GERD (gastroesophageal reflux disease)    Major depressive disorder, single episode, moderate (HCC)    Chronic frontal sinusitis 01/04/2020    Immunization History  Administered Date(s) Administered   Fluad Quad(high Dose 65+) 08/02/2020   Influenza-Unspecified 07/28/2018, 08/31/2019   Pneumococcal Conjugate-13 08/02/2020    Conditions to be addressed/monitored:  Hypertension and Hyperlipidemia  Care Plan : [place]  Updates made by Lane Hacker, [place] since 08/09/2021 12:00 AM     Problem: HTN, Pain,   Priority: High  Onset Date: 08/09/2021     Goal: Disease State Management   Start Date: 08/09/2021  Expected End Date: 08/09/2022  This Visit's Progress: On track  Priority: High  Note:   Current Barriers:  Does not maintain  contact with provider office Does not contact provider office for questions/concerns  Pharmacist Clinical Goal(s):  Patient will contact provider office for questions/concerns as evidenced notation of same in electronic health record through collaboration with PharmD and provider.   Interventions: 1:1 collaboration with Marge Duncans,  PA-C regarding development and update of comprehensive plan of care as evidenced by provider attestation and co-signature Inter-disciplinary care team collaboration (see longitudinal plan of care) Comprehensive medication review performed; medication list updated in electronic medical record  Hypertension (BP goal <140/90) BP Readings from Last 3 Encounters:  07/11/21 (!) 144/78  03/08/21 134/64  02/15/21 112/68  -Uncontrolled -Current treatment: Metoprolol 18m ER -Medications previously tried: Metoprolol 12.547mER  -Current home readings: Often above 14073ystolic -Current dietary habits: "Tries to eat healthy" -Current exercise habits: Walks daily -Denies hypotensive/hypertensive symptoms -Educated on BP goals and benefits of medications for prevention of heart attack, stroke and kidney damage; -Counseled to monitor BP at home Daily, document, and provide log at future appointments October 2023: Will alert specialist and defer to them. Patient just had a death in the family so she may need anti-hypertensive medicine temporarily until her mind settles  Tremor (Goal: Decrease tremor) -Not ideally controlled -Current treatment  Venlafaxine 37.73m54mD Metoprolol ER 273m6medications previously tried: N/A  -Recommended to continue current medication   Hyperlipidemia: (LDL goal < 100) The 10-year ASCVD risk score (Arnett DK, et al., 2019) is: 8.1%   Values used to calculate the score:     Age: 21 y20rs     Sex: Female     Is Non-Hispanic African American: No     Diabetic: No     Tobacco smoker: No     Systolic Blood Pressure: 144 710g     Is BP  treated: No     HDL Cholesterol: 46 mg/dL     Total Cholesterol: 193 mg/dL Lab Results  Component Value Date   CHOL 193 02/15/2021   CHOL 199 08/02/2020   Lab Results  Component Value Date   HDL 46 02/15/2021   HDL 53 08/02/2020   Lab Results  Component Value Date   LDLCALC 118 (H) 02/15/2021   LDLCALC 127 (H) 08/02/2020   Lab Results  Component Value Date   TRIG 163 (H) 02/15/2021   TRIG 103 08/02/2020   Lab Results  Component Value Date   CHOLHDL 4.2 02/15/2021   CHOLHDL 3.8 08/02/2020  No results found for: LDLDIRECT -Not ideally controlled -Current treatment: None -Medications previously tried: None  -Current dietary habits: "Tries to eat healthy" -Current exercise habits: Walks daily -Educated on Cholesterol goals;  October 2022: Statin candidate due to LDL levels but ASCVD is less than 10%  Chronic Pain -Controlled -Current treatment: Celecoxib PRN -Medications previously tried: Tylenol/IBU  -Pain Scale Without Meds: 5/10  With Meds: 2/10  Aggravating Factors: Taking care of grandkids  Pain Type: Didn't specify  -Recommended to continue current medication   Patient Goals/Self-Care Activities Patient will:  - take medications as prescribed  Follow Up Plan: The patient has been provided with contact information for the care management team and has been advised to call with any health related questions or concerns.    [place] Constablearm.D. - [phone]  CPP F/U July 2023      Medication Assistance: None required.  Patient affirms current coverage meets needs.  Compliance/Adherence/Medication fill history: [facility] Drugs:  Medication:                Last Fill:         Day Supply None noted        Care Gaps: Last annual wellness visit? None noted  If applicable: N/A Last eye exam / retinopathy screening? Last diabetic foot exam?  Patient's preferred pharmacy is:  [address] - [place], [place] [address] Phone: [phone] Fax: [phone]   We discussed: Benefits of medication synchronization, packaging and delivery as well as enhanced pharmacist oversight with [employer]. Patient decided to: Continue current medication management strategy  Care Plan and Follow Up Patient Decision:  Patient agrees to Care Plan and Follow-up.  Plan: The patient has been provided with contact information for the care management team and has been advised to call with any health related questions or concerns.   CPP F/U July 2023  [place] Constable, Sherian Rein.D. - [phone]

## 2021-08-17 ENCOUNTER — Other Ambulatory Visit: Payer: Self-pay

## 2021-08-17 ENCOUNTER — Encounter: Payer: Self-pay | Admitting: Physician Assistant

## 2021-08-17 ENCOUNTER — Ambulatory Visit (INDEPENDENT_AMBULATORY_CARE_PROVIDER_SITE_OTHER): Payer: Medicare HMO | Admitting: Physician Assistant

## 2021-08-17 VITALS — BP 134/68 | HR 55 | Temp 97.4°F | Resp 18 | Ht 60.0 in | Wt 161.0 lb

## 2021-08-17 DIAGNOSIS — F321 Major depressive disorder, single episode, moderate: Secondary | ICD-10-CM

## 2021-08-17 DIAGNOSIS — Z23 Encounter for immunization: Secondary | ICD-10-CM

## 2021-08-17 DIAGNOSIS — E559 Vitamin D deficiency, unspecified: Secondary | ICD-10-CM

## 2021-08-17 DIAGNOSIS — I4729 Other ventricular tachycardia: Secondary | ICD-10-CM

## 2021-08-17 MED ORDER — VENLAFAXINE HCL ER 37.5 MG PO CP24: ORAL_CAPSULE | ORAL | 1 refills | Status: DC

## 2021-08-17 MED ORDER — VITAMIN D (ERGOCALCIFEROL) 1.25 MG (50000 UNIT) PO CAPS: 50000.0000 [IU] | ORAL_CAPSULE | ORAL | 1 refills | Status: DC

## 2021-08-17 NOTE — Progress Notes (Signed)
Established Patient Office Visit  Subjective:  [name], female    DOB: [birth year]  Age: 65 y.o. MRN: [id number]  CC:  Chief Complaint  Patient presents with   Gastroesophageal Reflux    HPI Brittany Short presents for chronic follow up  Pt is now seeing cardiology and diagnosed with PAT - currently on toprol XL 52m 1qd and that is controlling symptoms well - has follow up appt in December  Pt with history of anxiety - symptoms are stable on effexor xr 37.584mand voices no concerns or problems  Pt with history of GERD - symptoms stable on protonix 4043md -   Pt with history of arthritis - symptoms stable on celebrex 200m80m- she states that she intermittently takes this medication because of her history of GERD  Pt with history of vit D def - is due for labwork and needs refill of medication  Pt would like flu shot and pneumonia shot today  Past Medical History:  Diagnosis Date   GERD (gastroesophageal reflux disease)    Major depressive disorder, single episode, moderate (HCC)    Mild intermittent asthma with (acute) exacerbation    Nontoxic single thyroid nodule     Past Surgical History:  Procedure Laterality Date   CESAREAN SECTION  1982 & 1998   X2   THYROIDECTOMY Right     Family History  Problem Relation Age of Onset   Lung cancer Mother    COPD Mother    Heart attack Father    Depression Sister    Suicidality Sister    Lung cancer Brother    COPD Brother    Breast cancer Neg Hx     Social History   Socioeconomic History   Marital status: Married    Spouse name: Not on file   Number of children: 2   Years of education: Not on file   Highest education level: Not on file  Occupational History   Not on file  Tobacco Use   Smoking status: Never   Smokeless tobacco: Never  Vaping Use   Vaping Use: Never used  Substance and Sexual Activity   Alcohol use: Not Currently   Drug use: Never   Sexual activity: Not on file  Other  Topics Concern   Not on file  Social History Narrative   Not on file   Social Determinants of Health   Financial Resource Strain: Low Risk    Difficulty of Paying Living Expenses: Not hard at all  Food Insecurity: Not on file  Transportation Needs: No Transportation Needs   Lack of Transportation (Medical): No   Lack of Transportation (Non-Medical): No  Physical Activity: Not on file  Stress: Not on file  Social Connections: Not on file  Intimate Partner Violence: Not on file     Current Outpatient Medications:    albuterol (VENTOLIN HFA) 108 (90 Base) MCG/ACT inhaler, Inhale 2 puffs into the lungs every 4 (four) hours as needed for wheezing or shortness of breath., Disp: 18 g, Rfl: 2   celecoxib (CELEBREX) 200 MG capsule, Take 1 capsule (200 mg total) by mouth daily., Disp: 90 capsule, Rfl: 0   fluticasone (FLONASE) 50 MCG/ACT nasal spray, Place 2 sprays into both nostrils daily., Disp: 16 g, Rfl: 3   metoprolol succinate (TOPROL-XL) 25 MG 24 hr tablet, Take 1 tablet (25 mg total) by mouth daily., Disp: 90 tablet, Rfl: 3   montelukast (SINGULAIR) 10 MG tablet, Take 10 mg by mouth daily.,  Disp: , Rfl:    pantoprazole (PROTONIX) 40 MG tablet, Take 1 tablet (40 mg total) by mouth daily., Disp: 90 tablet, Rfl: 1   venlafaxine XR (EFFEXOR-XR) 37.5 MG 24 hr capsule, TAKE 1 CAPSULE BY MOUTH EVERY DAY, Disp: 90 capsule, Rfl: 1   Vitamin D, Ergocalciferol, (DRISDOL) 1.25 MG (50000 UNIT) CAPS capsule, Take 1 capsule (50,000 Units total) by mouth every 7 (seven) days., Disp: 12 capsule, Rfl: 1   No Known Allergies  ROS CONSTITUTIONAL: Negative for chills, fatigue, fever, unintentional weight gain and unintentional weight loss.  E/N/T: Negative for ear pain, nasal congestion and sore throat.  CARDIOVASCULAR: Negative for chest pain, dizziness, palpitations and pedal edema.  RESPIRATORY: Negative for recent cough and dyspnea.  GASTROINTESTINAL: Negative for abdominal pain, acid reflux  symptoms, constipation, diarrhea, nausea and vomiting.  PSYCHIATRIC: Negative for sleep disturbance and to question depression screen.  Negative for depression, negative for anhedonia.      Objective:  PHYSICAL EXAM:   VS: BP 134/68   Pulse (!) 55   Temp (!) 97.4 F (36.3 C)   Resp 18   Ht 5' (1.524 m)   Wt 161 lb (73 kg)   SpO2 96%   BMI 31.44 kg/m   GEN: Well nourished, well developed, in no acute distress  Cardiac: RRR; no murmurs, rubs, or gallops,no edema -  Respiratory:  normal respiratory rate and pattern with no distress - normal breath sounds with no rales, rhonchi, wheezes or rubs Skin: warm and dry, no rash  Neuro:  Alert and Oriented x 3,- CN II-Xii grossly intact Psych: euthymic mood, appropriate affect and demeanor  BP 134/68   Pulse (!) 55   Temp (!) 97.4 F (36.3 C)   Resp 18   Ht 5' (1.524 m)   Wt 161 lb (73 kg)   SpO2 96%   BMI 31.44 kg/m  Wt Readings from Last 3 Encounters:  08/17/21 161 lb (73 kg)  07/11/21 161 lb 3.2 oz (73.1 kg)  03/08/21 154 lb (69.9 kg)     Health Maintenance Due  Topic Date Due   TETANUS/TDAP  Never done   Zoster Vaccines- Shingrix (1 of 2) Never done    There are no preventive care reminders to display for this patient.  Lab Results  Component Value Date   TSH 2.130 02/15/2021   Lab Results  Component Value Date   WBC 7.2 02/15/2021   HGB 14.7 02/15/2021   HCT 42.9 02/15/2021   MCV 90 02/15/2021   PLT 273 02/15/2021   Lab Results  Component Value Date   NA 142 02/15/2021   K 4.7 02/15/2021   CO2 25 02/15/2021   GLUCOSE 97 02/15/2021   BUN 13 02/15/2021   CREATININE 0.68 02/15/2021   BILITOT 0.3 02/15/2021   ALKPHOS 85 02/15/2021   AST 18 02/15/2021   ALT 19 02/15/2021   PROT 6.9 02/15/2021   ALBUMIN 4.3 02/15/2021   CALCIUM 9.3 02/15/2021   EGFR 96 02/15/2021   Lab Results  Component Value Date   CHOL 193 02/15/2021   Lab Results  Component Value Date   HDL 46 02/15/2021   Lab Results   Component Value Date   LDLCALC 118 (H) 02/15/2021   Lab Results  Component Value Date   TRIG 163 (H) 02/15/2021   Lab Results  Component Value Date   CHOLHDL 4.2 02/15/2021   No results found for: HGBA1C    Assessment & Plan:   Problem List Items Addressed  This Visit       Cardiovascular and Mediastinum   NSVT (nonsustained ventricular tachycardia) ([place]) - Primary   Relevant Orders   CBC with Differential/Platelet   Comprehensive metabolic panel   Lipid panel Continue current med and follow up with cardiology as directed Continue meds     Digestive   GERD (gastroesophageal reflux disease)   Relevant Medications   pantoprazole (PROTONIX) 40 MG tablet     Endocrine   Nontoxic single thyroid nodule   Relevant Orders   TSH     Other   Major depressive disorder, single episode, moderate (HCC)   Relevant Medications   venlafaxine XR (EFFEXOR-XR) 37.5 MG 24 hr capsule      Need for flu vaccine Fluad given  Need for pneumonia vaccine PCV 20 given       Vitamin D insufficiency       Relevant Orders   VITAMIN D 25 Hydroxy (Vit-D Deficiency, Fractures)       Meds ordered this encounter  Medications   venlafaxine XR (EFFEXOR-XR) 37.5 MG 24 hr capsule    Sig: TAKE 1 CAPSULE BY MOUTH EVERY DAY    Dispense:  90 capsule    Refill:  1    Order Specific Question:   Supervising Provider    Answer:   Shelton Silvas   Vitamin D, Ergocalciferol, (DRISDOL) 1.25 MG (50000 UNIT) CAPS capsule    Sig: Take 1 capsule (50,000 Units total) by mouth every 7 (seven) days.    Dispense:  12 capsule    Refill:  1    Order Specific Question:   Supervising Provider    Answer:   Shelton Silvas   More than 30 minutes of time spent with patient for assessment/treatment Follow-up: Return in about 4 weeks (around 09/14/2021) for medicare wellness with me.    SARA R Laurajean Hosek, PA-C

## 2021-08-27 DIAGNOSIS — I4729 Other ventricular tachycardia: Secondary | ICD-10-CM

## 2021-08-27 DIAGNOSIS — F321 Major depressive disorder, single episode, moderate: Secondary | ICD-10-CM

## 2021-09-18 ENCOUNTER — Ambulatory Visit (INDEPENDENT_AMBULATORY_CARE_PROVIDER_SITE_OTHER): Payer: Medicare HMO | Admitting: Physician Assistant

## 2021-09-18 ENCOUNTER — Encounter: Payer: Self-pay | Admitting: Physician Assistant

## 2021-09-18 ENCOUNTER — Telehealth: Payer: Self-pay

## 2021-09-18 ENCOUNTER — Other Ambulatory Visit: Payer: Self-pay

## 2021-09-18 VITALS — BP 126/72 | HR 71 | Temp 96.6°F | Ht 60.0 in | Wt 158.8 lb

## 2021-09-18 DIAGNOSIS — Z0000 Encounter for general adult medical examination without abnormal findings: Secondary | ICD-10-CM | POA: Diagnosis not present

## 2021-09-18 DIAGNOSIS — E559 Vitamin D deficiency, unspecified: Secondary | ICD-10-CM | POA: Diagnosis not present

## 2021-09-18 DIAGNOSIS — I4729 Other ventricular tachycardia: Secondary | ICD-10-CM | POA: Diagnosis not present

## 2021-09-18 NOTE — Chronic Care Management (AMB) (Signed)
    Chronic Care Management Pharmacy Assistant   Name: Brittany Short  MRN: [id number] DOB: 09/02/1956   Reason for Encounter: Disease State/Hypertension Adherence Call   Conditions to be addressed/monitored: Hypertension  Recent office visits:  08/17/2021 Marianne Sofia PA-C no medication changes- PCV 20 given -follow up 4 weeks  Hospital visits:  None in previous 6 months  1st attempt 09/18/2021 left voicemail 2nd attempt 09/24/2021 left voicemail 3rd attempt 09/25/2021 left voicemail    Recent Office Vitals: BP Readings from Last 3 Encounters:  09/18/21 126/72  08/17/21 134/68  07/11/21 (!) 144/78   Pulse Readings from Last 3 Encounters:  09/18/21 71  08/17/21 (!) 55  07/11/21 88    Wt Readings from Last 3 Encounters:  09/18/21 158 lb 12.8 oz (72 kg)  08/17/21 161 lb (73 kg)  07/11/21 161 lb 3.2 oz (73.1 kg)     Kidney Function Lab Results  Component Value Date/Time   CREATININE 0.68 02/15/2021 10:07 AM   CREATININE 0.64 10/18/2020 02:34 PM   GFRNONAA 94 10/18/2020 02:34 PM   GFRAA 108 10/18/2020 02:34 PM    BMP Latest Ref Rng & Units 02/15/2021 10/18/2020 08/02/2020  Glucose 65 - 99 mg/dL 97 90 263(F)  BUN 8 - 27 mg/dL 13 18 10   Creatinine 0.57 - 1.00 mg/dL 3.54 5.62  BUN/Creat Ratio 12 - 28 19 28 13   Sodium 134 - 144 mmol/L 142 143 143  Potassium 3.5 - 5.2 mmol/L 4.7 3.9 4.8  Chloride 96 - 106 mmol/L 102 106 104  CO2 20 - 29 mmol/L 25 25 27   Calcium 8.7 - 10.3 mg/dL 9.3 8.9 9.4   Current antihypertensive regimen:  Metoprolol 25mg  ER   Care Gaps: Last annual wellness visit? 09/18/2021  Star Rating Drugs:  None   Medications: Outpatient Encounter Medications as of 09/18/2021  Medication Sig   albuterol (VENTOLIN HFA) 108 (90 Base) MCG/ACT inhaler Inhale 2 puffs into the lungs every 4 (four) hours as needed for wheezing or shortness of breath.   celecoxib (CELEBREX) 200 MG capsule Take 1 capsule (200 mg total) by mouth daily.   fluticasone  (FLONASE) 50 MCG/ACT nasal spray Place 2 sprays into both nostrils daily.   metoprolol succinate (TOPROL-XL) 25 MG 24 hr tablet Take 1 tablet (25 mg total) by mouth daily.   montelukast (SINGULAIR) 10 MG tablet Take 10 mg by mouth daily.   pantoprazole (PROTONIX) 40 MG tablet Take 1 tablet (40 mg total) by mouth daily.   venlafaxine XR (EFFEXOR-XR) 37.5 MG 24 hr capsule TAKE 1 CAPSULE BY MOUTH EVERY DAY   Vitamin D, Ergocalciferol, (DRISDOL) 1.25 MG (50000 UNIT) CAPS capsule Take 1 capsule (50,000 Units total) by mouth every 7 (seven) days.   No facility-administered encounter medications on file as of 09/18/2021.   Clinical Pharmacist Assistant [phone]

## 2021-09-18 NOTE — Progress Notes (Signed)
Subjective:  [name], female    DOB: 02-May-1956  Age: 65 y.o. MRN: [id number]  Chief Complaint  Patient presents with   Annual Exam    AWV    HPI @HPI @ Well Adult Physical: Patient here for a comprehensive physical exam.The patient reports no problems Do you take any herbs or supplements that were not prescribed by a doctor? no Are you taking calcium supplements? no Are you taking aspirin daily? no  Physical ("At Risk" items are starred): Patient's last physical exam was 1 year ago .  Safety: reviewed ; Patient wears a seat belt, has smoke detectors, has carbon monoxide detectors, practices appropriate gun safety, and wears sunscreen with extended sun exposure. Dental Care: biannual cleanings, brushes and flosses daily. Ophthalmology/Optometry: Annual visit.  Hearing loss: none Vision impairments: none  Fall Risk  09/18/2021 02/15/2021  Falls in the past year? 0 0  Number falls in past yr: 0 0  Injury with Fall? 0 0  Risk for fall due to : No Fall Risks No Fall Risks  Follow up - Falls evaluation completed     Depression screen [facility] 2/9 09/18/2021 02/15/2021  Decreased Interest 0 0  Down, Depressed, Hopeless 0 0  PHQ - 2 Score 0 0  Altered sleeping - 0  Tired, decreased energy - 0  Change in appetite - 0  Feeling bad or failure about yourself  - 0  Trouble concentrating - 0  Moving slowly or fidgety/restless - 0  Suicidal thoughts - 0  PHQ-9 Score - 0  Difficult doing work/chores - Not difficult at all       Functional Status Survey:     Health Maintenance  Topic Date Due   Tetanus Vaccine  Never done   Zoster (Shingles) Vaccine (1 of 2) Never done   DEXA scan (bone density measurement)  03/25/2022*   Mammogram  03/19/2022   Cologuard (Stool DNA test)  12/26/2023   Pneumonia Vaccine  Completed   Flu Shot  Completed   HPV Vaccine  Aged Out   COVID-19 Vaccine  Discontinued   Hepatitis C Screening: USPSTF Recommendation to screen - Ages 63-79  yo.  Discontinued  *Topic was postponed. The date shown is not the original due date.     Social Hx   Social History   Socioeconomic History   Marital status: Married    Spouse name: Not on file   Number of children: 2   Years of education: Not on file   Highest education level: Not on file  Occupational History   Not on file  Tobacco Use   Smoking status: Never   Smokeless tobacco: Never  Vaping Use   Vaping Use: Never used  Substance and Sexual Activity   Alcohol use: Not Currently   Drug use: Never   Sexual activity: Not on file  Other Topics Concern   Not on file  Social History Narrative   Not on file   Social Determinants of Health   Financial Resource Strain: Low Risk    Difficulty of Paying Living Expenses: Not hard at all  Food Insecurity: No Food Insecurity   Worried About Programme researcher, broadcasting/film/video in the Last Year: Never true   Ran Out of Food in the Last Year: Never true  Transportation Needs: No Transportation Needs   Lack of Transportation (Medical): No   Lack of Transportation (Non-Medical): No  Physical Activity: Insufficiently Active   Days of Exercise per Week: 3 days  Minutes of Exercise per Session: 30 min  Stress: No Stress Concern Present   Feeling of Stress : Not at all  Social Connections: Socially Integrated   Frequency of Communication with Friends and Family: More than three times a week   Frequency of Social Gatherings with Friends and Family: Twice a week   Attends Religious Services: More than 4 times per year   Active Member of [employer] or Organizations: Yes   Attends Engineer, structural: More than 4 times per year   Marital Status: Married   Past Medical History:  Diagnosis Date   GERD (gastroesophageal reflux disease)    Major depressive disorder, single episode, moderate (HCC)    Mild intermittent asthma with (acute) exacerbation    Nontoxic single thyroid nodule    Family History  Problem Relation Age of Onset   Lung  cancer Mother    COPD Mother    Heart attack Father    Depression Sister    Suicidality Sister    Lung cancer Brother    COPD Brother    Breast cancer Neg Hx     Review of Systems CONSTITUTIONAL: Negative for chills, fatigue, fever, unintentional weight gain and unintentional weight loss.  E/N/T: Negative for ear pain, nasal congestion and sore throat.  CARDIOVASCULAR: Negative for chest pain, dizziness, palpitations and pedal edema.  RESPIRATORY: Negative for recent cough and dyspnea.  GASTROINTESTINAL: Negative for abdominal pain, acid reflux symptoms, constipation, diarrhea, nausea and vomiting.  MSK: Negative for arthralgias and myalgias.  INTEGUMENTARY: Negative for rash.  NEUROLOGICAL: Negative for dizziness and headaches.  PSYCHIATRIC: Negative for sleep disturbance and to question depression screen.  Negative for depression, negative for anhedonia.      Objective:  BP 126/72 (BP Location: Left Arm, Patient Position: Sitting, Cuff Size: Normal)   Pulse 71   Temp (!) 96.6 F (35.9 C) (Temporal)   Ht 5' (1.524 m)   Wt 158 lb 12.8 oz (72 kg)   SpO2 96%   BMI 31.01 kg/m   BP/Weight 09/18/2021 08/17/2021 07/11/2021  Systolic BP 126 134 144  Diastolic BP 72 68 78  Wt. (Lbs) 158.8 161 161.2  BMI 31.01 31.44 31.48    Physical Exam PHYSICAL EXAM:   VS: BP 126/72 (BP Location: Left Arm, Patient Position: Sitting, Cuff Size: Normal)   Pulse 71   Temp (!) 96.6 F (35.9 C) (Temporal)   Ht 5' (1.524 m)   Wt 158 lb 12.8 oz (72 kg)   SpO2 96%   BMI 31.01 kg/m   GEN: Well nourished, well developed, in no acute distress  Cardiac: RRR; no murmurs, rubs, or gallops,no edema -  Respiratory:  normal respiratory rate and pattern with no distress - normal breath sounds with no rales, rhonchi, wheezes or rubs Skin: warm and dry, no rash  Psych: euthymic mood, appropriate affect and demeanor  Lab Results  Component Value Date   WBC 7.2 02/15/2021   HGB 14.7 02/15/2021   HCT  42.9 02/15/2021   PLT 273 02/15/2021   GLUCOSE 97 02/15/2021   CHOL 193 02/15/2021   TRIG 163 (H) 02/15/2021   HDL 46 02/15/2021   LDLCALC 118 (H) 02/15/2021   ALT 19 02/15/2021   AST 18 02/15/2021   NA 142 02/15/2021   K 4.7 02/15/2021   CL 102 02/15/2021   CREATININE 0.68 02/15/2021   BUN 13 02/15/2021   CO2 25 02/15/2021   TSH 2.130 02/15/2021      Assessment & Plan:  Problem List Items Addressed This Visit   None Visit Diagnoses     Medicare annual wellness visit, subsequent    -  Primary        No orders of the defined types were placed in this encounter.   These are the goals we discussed:  Goals      Manage My Medicine     Timeframe:  Long-Range Goal Priority:  High Start Date:                             Expected End Date:                       Follow Up Date 08/09/22   - keep a list of all the medicines I take; vitamins and herbals too - learn to read medicine labels    Why is this important?   These steps will help you keep on track with your medicines.   Notes:      Track and Manage My Blood Pressure-Hypertension     Timeframe:  Long-Range Goal Priority:  High Start Date:                             Expected End Date:                       Follow Up Date 08/09/22   - choose a place to take my blood pressure (home, clinic or office, retail store) - write blood pressure results in a log or diary    Why is this important?   You won't feel high blood pressure, but it can still hurt your blood vessels.  High blood pressure can cause heart or kidney problems. It can also cause a stroke.  Making lifestyle changes like losing a little weight or eating less salt will help.  Checking your blood pressure at home and at different times of the day can help to control blood pressure.  If the doctor prescribes medicine remember to take it the way the doctor ordered.  Call the office if you cannot afford the medicine or if there are questions about  it.     Notes:          This is a list of the screening recommended for you and due dates:  Health Maintenance  Topic Date Due   Tetanus Vaccine  Never done   Zoster (Shingles) Vaccine (1 of 2) Never done   DEXA scan (bone density measurement)  03/25/2022*   Mammogram  03/19/2022   Cologuard (Stool DNA test)  12/26/2023   Pneumonia Vaccine  Completed   Flu Shot  Completed   HPV Vaccine  Aged Out   COVID-19 Vaccine  Discontinued   Hepatitis C Screening: USPSTF Recommendation to screen - Ages 91-79 yo.  Discontinued  *Topic was postponed. The date shown is not the original due date.     AN INDIVIDUALIZED CARE PLAN: was established or reinforced today.   SELF MANAGEMENT: The patient and I together assessed ways to personally work towards obtaining the recommended goals  Support needs The patient and/or family needs were assessed and services were offered and not necessary at this time.   Rx to get Tdap Follow-up: Return for pt has chronic follow up scheduled.  Yetta Flock [facility] [phone]

## 2021-09-19 LAB — CBC WITH DIFFERENTIAL/PLATELET
Basophils Absolute: 0 10*3/uL (ref 0.0–0.2)
Basos: 1 %
EOS (ABSOLUTE): 0.1 10*3/uL (ref 0.0–0.4)
Eos: 2 %
Hematocrit: 43 % (ref 34.0–46.6)
Hemoglobin: 14.2 g/dL (ref 11.1–15.9)
Immature Grans (Abs): 0 10*3/uL (ref 0.0–0.1)
Immature Granulocytes: 0 %
Lymphocytes Absolute: 2.7 10*3/uL (ref 0.7–3.1)
Lymphs: 42 %
MCH: 30.3 pg (ref 26.6–33.0)
MCHC: 33 g/dL (ref 31.5–35.7)
MCV: 92 fL (ref 79–97)
Monocytes Absolute: 0.4 10*3/uL (ref 0.1–0.9)
Monocytes: 6 %
Neutrophils Absolute: 3.1 10*3/uL (ref 1.4–7.0)
Neutrophils: 49 %
Platelets: 212 10*3/uL (ref 150–450)
RBC: 4.68 x10E6/uL (ref 3.77–5.28)
RDW: 12.6 % (ref 11.7–15.4)
WBC: 6.3 10*3/uL (ref 3.4–10.8)

## 2021-09-19 LAB — COMPREHENSIVE METABOLIC PANEL
ALT: 16 IU/L (ref 0–32)
AST: 16 IU/L (ref 0–40)
Albumin/Globulin Ratio: 1.9 (ref 1.2–2.2)
Albumin: 4.5 g/dL (ref 3.8–4.8)
Alkaline Phosphatase: 76 IU/L (ref 44–121)
BUN/Creatinine Ratio: 20 (ref 12–28)
BUN: 13 mg/dL (ref 8–27)
Bilirubin Total: 0.3 mg/dL (ref 0.0–1.2)
CO2: 25 mmol/L (ref 20–29)
Calcium: 9.3 mg/dL (ref 8.7–10.3)
Chloride: 104 mmol/L (ref 96–106)
Creatinine, Ser: 0.64 mg/dL (ref 0.57–1.00)
Globulin, Total: 2.4 g/dL (ref 1.5–4.5)
Glucose: 99 mg/dL (ref 70–99)
Potassium: 4.7 mmol/L (ref 3.5–5.2)
Sodium: 143 mmol/L (ref 134–144)
Total Protein: 6.9 g/dL (ref 6.0–8.5)
eGFR: 97 mL/min/{1.73_m2} (ref >59)

## 2021-09-19 LAB — LIPID PANEL
Chol/HDL Ratio: 3.4 ratio (ref 0.0–4.4)
Cholesterol, Total: 194 mg/dL (ref 100–199)
HDL: 57 mg/dL (ref >39)
LDL Chol Calc (NIH): 126 mg/dL — ABNORMAL HIGH (ref 0–99)
Triglycerides: 62 mg/dL (ref 0–149)
VLDL Cholesterol Cal: 11 mg/dL (ref 5–40)

## 2021-09-19 LAB — VITAMIN D 25 HYDROXY (VIT D DEFICIENCY, FRACTURES): Vit D, 25-Hydroxy: 66.8 ng/mL (ref 30.0–100.0)

## 2021-09-19 LAB — CARDIOVASCULAR RISK ASSESSMENT

## 2021-10-04 ENCOUNTER — Encounter: Payer: Self-pay | Admitting: Physician Assistant

## 2021-10-04 ENCOUNTER — Ambulatory Visit (INDEPENDENT_AMBULATORY_CARE_PROVIDER_SITE_OTHER): Payer: Medicare HMO | Admitting: Physician Assistant

## 2021-10-04 VITALS — BP 150/62 | HR 76 | Temp 97.7°F | Ht 60.0 in | Wt 159.0 lb

## 2021-10-04 DIAGNOSIS — J060 Acute laryngopharyngitis: Secondary | ICD-10-CM

## 2021-10-04 LAB — POC COVID19 BINAXNOW: SARS Coronavirus 2 Ag: NEGATIVE

## 2021-10-04 LAB — POCT INFLUENZA A/B
Influenza A, POC: NEGATIVE
Influenza B, POC: NEGATIVE

## 2021-10-04 MED ORDER — AZITHROMYCIN 250 MG PO TABS: ORAL_TABLET | ORAL | 0 refills | Status: DC

## 2021-10-04 MED ORDER — BENZONATATE 100 MG PO CAPS: 100.0000 mg | ORAL_CAPSULE | Freq: Two times a day (BID) | ORAL | 0 refills | Status: DC | PRN

## 2021-10-04 NOTE — Progress Notes (Signed)
Subjective:  [name], female    DOB: Jun 11, 1956  Age: 65 y.o. MRN: [id number]  Chief Complaint  Patient presents with   Cough    HPI  Pt complains of cough, congestion, sore throat since last night - states last night had a fever up to 101 - has had some malaise as well Current Outpatient Medications on File Prior to Visit  Medication Sig Dispense Refill   albuterol (VENTOLIN HFA) 108 (90 Base) MCG/ACT inhaler Inhale 2 puffs into the lungs every 4 (four) hours as needed for wheezing or shortness of breath. 18 g 2   celecoxib (CELEBREX) 200 MG capsule Take 1 capsule (200 mg total) by mouth daily. 90 capsule 0   fluticasone (FLONASE) 50 MCG/ACT nasal spray Place 2 sprays into both nostrils daily. 16 g 3   metoprolol succinate (TOPROL-XL) 25 MG 24 hr tablet Take 1 tablet (25 mg total) by mouth daily. 90 tablet 3   montelukast (SINGULAIR) 10 MG tablet Take 10 mg by mouth daily.     pantoprazole (PROTONIX) 40 MG tablet Take 1 tablet (40 mg total) by mouth daily. 90 tablet 1   venlafaxine XR (EFFEXOR-XR) 37.5 MG 24 hr capsule TAKE 1 CAPSULE BY MOUTH EVERY DAY 90 capsule 1   Vitamin D, Ergocalciferol, (DRISDOL) 1.25 MG (50000 UNIT) CAPS capsule Take 1 capsule (50,000 Units total) by mouth every 7 (seven) days. 12 capsule 1   No current facility-administered medications on file prior to visit.   Past Medical History:  Diagnosis Date   GERD (gastroesophageal reflux disease)    Major depressive disorder, single episode, moderate (HCC)    Mild intermittent asthma with (acute) exacerbation    Nontoxic single thyroid nodule    Past Surgical History:  Procedure Laterality Date   CESAREAN SECTION  1982 & 1998   X2   THYROIDECTOMY Right     Family History  Problem Relation Age of Onset   Lung cancer Mother    COPD Mother    Heart attack Father    Depression Sister    Suicidality Sister    Lung cancer Brother    COPD Brother    Breast cancer Neg Hx    Social History    Socioeconomic History   Marital status: Married    Spouse name: Not on file   Number of children: 2   Years of education: Not on file   Highest education level: Not on file  Occupational History   Not on file  Tobacco Use   Smoking status: Never   Smokeless tobacco: Never  Vaping Use   Vaping Use: Never used  Substance and Sexual Activity   Alcohol use: Not Currently   Drug use: Never   Sexual activity: Not on file  Other Topics Concern   Not on file  Social History Narrative   Not on file   Social Determinants of Health   Financial Resource Strain: Low Risk    Difficulty of Paying Living Expenses: Not hard at all  Food Insecurity: No Food Insecurity   Worried About Programme researcher, broadcasting/film/video in the Last Year: Never true   Ran Out of Food in the Last Year: Never true  Transportation Needs: No Transportation Needs   Lack of Transportation (Medical): No   Lack of Transportation (Non-Medical): No  Physical Activity: Insufficiently Active   Days of Exercise per Week: 3 days   Minutes of Exercise per Session: 30 min  Stress: No Stress Concern Present  Feeling of Stress : Not at all  Social Connections: Socially Integrated   Frequency of Communication with Friends and Family: More than three times a week   Frequency of Social Gatherings with Friends and Family: Twice a week   Attends Religious Services: More than 4 times per year   Active Member of [employer] or Organizations: Yes   Attends Engineer, structural: More than 4 times per year   Marital Status: Married    Review of Systems CONSTITUTIONAL: see HPI E/N/T: see HPI CARDIOVASCULAR: Negative for chest pain, dizziness, palpitations and pedal edema.  RESPIRATORY:see HPI GASTROINTESTINAL: Negative for abdominal pain, acid reflux symptoms, constipation, diarrhea, nausea and vomiting.  INTEGUMENTARY: Negative for rash.     Objective:  BP (!) 150/62 (BP Location: Left Arm, Patient Position: Sitting, Cuff Size:  Normal)   Pulse 76   Temp 97.7 F (36.5 C) (Temporal)   Ht 5' (1.524 m)   Wt 159 lb (72.1 kg)   SpO2 95%   BMI 31.05 kg/m  PHYSICAL EXAM:   VS: BP (!) 150/62 (BP Location: Left Arm, Patient Position: Sitting, Cuff Size: Normal)   Pulse 76   Temp 97.7 F (36.5 C) (Temporal)   Ht 5' (1.524 m)   Wt 159 lb (72.1 kg)   SpO2 95%   BMI 31.05 kg/m   GEN: Well nourished, well developed, in no acute distress  HEENT: normal external ears and nose - normal external auditory canals and TMS -  - Lips, Teeth and Gums - normal  Oropharynx - erythema/pnd Cardiac: RRR; no murmurs, rubs, or gallops,no edema -  Respiratory:  scattered rhonchi noted Skin: warm and dry, no rash   Office Visit on 10/04/2021  Component Date Value Ref Range Status   SARS Coronavirus 2 Ag 10/04/2021 Negative  Negative Final   Influenza A, POC 10/04/2021 Negative  Negative Final   Influenza B, POC 10/04/2021 Negative  Negative Final    BP/Weight 10/04/2021 09/18/2021 08/17/2021  Systolic BP 150 126 134  Diastolic BP 62 72 68  Wt. (Lbs) 159 158.8 161  BMI 31.05 31.01 31.44     Diabetic Foot Exam - Simple   No data filed      Lab Results  Component Value Date   WBC 6.3 09/18/2021   HGB 14.2 09/18/2021   HCT 43.0 09/18/2021   PLT 212 09/18/2021   GLUCOSE 99 09/18/2021   CHOL 194 09/18/2021   TRIG 62 09/18/2021   HDL 57 09/18/2021   LDLCALC 126 (H) 09/18/2021   ALT 16 09/18/2021   AST 16 09/18/2021   NA 143 09/18/2021   K 4.7 09/18/2021   CL 104 09/18/2021   CREATININE 0.64 09/18/2021   BUN 13 09/18/2021   CO2 25 09/18/2021   TSH 2.130 02/15/2021      Assessment & Plan:   Problem List Items Addressed This Visit   None Visit Diagnoses     Acute laryngopharyngitis    -  Primary   Relevant Medications   azithromycin (ZITHROMAX) 250 MG tablet   benzonatate (TESSALON) 100 MG capsule   Other Relevant Orders   POC COVID-19 BinaxNow (Completed)   POCT Influenza A/B (Completed)      .  Meds ordered this encounter  Medications   azithromycin (ZITHROMAX) 250 MG tablet    Sig: Take 2 tablets on day 1, then 1 tablet daily on days 2 through 5    Dispense:  6 tablet    Refill:  0  Order Specific Question:   Supervising Provider    Answer:   Blane Ohara [[id number]]   benzonatate (TESSALON) 100 MG capsule    Sig: Take 1 capsule (100 mg total) by mouth 2 (two) times daily as needed for cough.    Dispense:  20 capsule    Refill:  0    Order Specific Question:   Supervising Provider    AnswerCorey Harold    Orders Placed This Encounter  Procedures   POC COVID-19 BinaxNow   POCT Influenza A/B     Follow-up: Return if symptoms worsen or fail to improve.  An After Visit Summary was printed and given to the patient.  Jettie Pagan [facility] [phone]

## 2021-10-08 ENCOUNTER — Encounter: Payer: Self-pay | Admitting: Physician Assistant

## 2021-10-08 ENCOUNTER — Ambulatory Visit: Payer: Medicare HMO | Admitting: Legal Medicine

## 2021-10-08 ENCOUNTER — Telehealth (INDEPENDENT_AMBULATORY_CARE_PROVIDER_SITE_OTHER): Payer: Medicare HMO | Admitting: Physician Assistant

## 2021-10-08 VITALS — Temp 101.4°F | Ht 60.0 in | Wt 159.0 lb

## 2021-10-08 DIAGNOSIS — J060 Acute laryngopharyngitis: Secondary | ICD-10-CM

## 2021-10-08 MED ORDER — DOXYCYCLINE HYCLATE 100 MG PO TABS: 100.0000 mg | ORAL_TABLET | Freq: Two times a day (BID) | ORAL | 0 refills | Status: DC

## 2021-10-08 MED ORDER — PREDNISONE 20 MG PO TABS
ORAL_TABLET | ORAL | 0 refills | Status: DC
Start: 2021-10-08 — End: 2021-10-15

## 2021-10-08 NOTE — Progress Notes (Signed)
Virtual Visit via Telephone Note   This visit type was conducted due to national recommendations for restrictions regarding the COVID-19 Pandemic (e.g. social distancing) in an effort to limit this patient's exposure and mitigate transmission in our community.  Due to her co-morbid illnesses, this patient is at least at moderate risk for complications without adequate follow up.  This format is felt to be most appropriate for this patient at this time.  The patient did not have access to video technology/had technical difficulties with video requiring transitioning to audio format only (telephone).  All issues noted in this document were discussed and addressed.  No physical exam could be performed with this format.  Patient verbally consented to a telehealth visit.   Date:  10/08/2021   ID:  Brittany Short, DOB 06/10/1956, MRN [id number]  Patient Location: Home Provider Location: Office  PCP:  Marianne Sofia, PA-C    Chief Complaint:  URI/cough  History of Present Illness:    Brittany Short is a 65 y.o. female with continued cough and congestion - pt was seen in office last week and diagnosed with URI - she just finished zpack today - states she is still coughing and has slight wheezing - denies fever/malaise  Is using inhaler as needed  The patient does not have symptoms concerning for COVID-19 infection (fever, chills, cough, or new shortness of breath).    Past Medical History:  Diagnosis Date   GERD (gastroesophageal reflux disease)    Major depressive disorder, single episode, moderate (HCC)    Mild intermittent asthma with (acute) exacerbation    Nontoxic single thyroid nodule    Past Surgical History:  Procedure Laterality Date   CESAREAN SECTION  1982 & 1998   X2   THYROIDECTOMY Right      Current Meds  Medication Sig   albuterol (VENTOLIN HFA) 108 (90 Base) MCG/ACT inhaler Inhale 2 puffs into the lungs every 4 (four) hours as needed for wheezing or shortness of  breath.   benzonatate (TESSALON) 100 MG capsule Take 1 capsule (100 mg total) by mouth 2 (two) times daily as needed for cough.   celecoxib (CELEBREX) 200 MG capsule Take 1 capsule (200 mg total) by mouth daily.   doxycycline (VIBRA-TABS) 100 MG tablet Take 1 tablet (100 mg total) by mouth 2 (two) times daily.   fluticasone (FLONASE) 50 MCG/ACT nasal spray Place 2 sprays into both nostrils daily.   metoprolol succinate (TOPROL-XL) 25 MG 24 hr tablet Take 1 tablet (25 mg total) by mouth daily.   montelukast (SINGULAIR) 10 MG tablet Take 10 mg by mouth daily.   pantoprazole (PROTONIX) 40 MG tablet Take 1 tablet (40 mg total) by mouth daily.   predniSONE (DELTASONE) 20 MG tablet Take 3 tablets (60 mg total) by mouth daily with breakfast for 3 days, THEN 2 tablets (40 mg total) daily with breakfast for 3 days, THEN 1 tablet (20 mg total) daily with breakfast for 3 days.   venlafaxine XR (EFFEXOR-XR) 37.5 MG 24 hr capsule TAKE 1 CAPSULE BY MOUTH EVERY DAY   Vitamin D, Ergocalciferol, (DRISDOL) 1.25 MG (50000 UNIT) CAPS capsule Take 1 capsule (50,000 Units total) by mouth every 7 (seven) days.     Allergies:   Patient has no known allergies.   Social History   Tobacco Use   Smoking status: Never   Smokeless tobacco: Never  Vaping Use   Vaping Use: Never used  Substance Use Topics   Alcohol use: Not Currently   Drug  use: Never     Family Hx: The patient's family history includes COPD in her brother and mother; Depression in her sister; Heart attack in her father; Lung cancer in her brother and mother; Suicidality in her sister. There is no history of Breast cancer.  ROS:   Please see the history of present illness.    All other systems reviewed and are negative.  Labs/Other Tests and Data Reviewed:    Recent Labs: 02/15/2021: TSH 2.130 09/18/2021: ALT 16; BUN 13; Creatinine, Ser 0.64; Hemoglobin 14.2; Platelets 212; Potassium 4.7; Sodium 143   Recent Lipid Panel Lab Results   Component Value Date/Time   CHOL 194 09/18/2021 10:19 AM   TRIG 62 09/18/2021 10:19 AM   HDL 57 09/18/2021 10:19 AM   CHOLHDL 3.4 09/18/2021 10:19 AM   LDLCALC 126 (H) 09/18/2021 10:19 AM    Wt Readings from Last 3 Encounters:  10/08/21 159 lb (72.1 kg)  10/04/21 159 lb (72.1 kg)  09/18/21 158 lb 12.8 oz (72 kg)     Objective:    Vital Signs:  Temp (!) 101.4 F (38.6 C)   Ht 5' (1.524 m)   Wt 159 lb (72.1 kg)   BMI 31.05 kg/m    VITAL SIGNS:  reviewed GEN:  no acute distress  ASSESSMENT & PLAN:    URI/cough - will send in rx for doxycycline and prednisone - use inhaler as directed and follow up if any symptoms change or worsen  COVID-19 Education: The signs and symptoms of COVID-19 were discussed with the patient and how to seek care for testing (follow up with PCP or arrange E-visit). The importance of social distancing was discussed today.  Time:   Today, I have spent 10 minutes with the patient with telehealth technology discussing the above problems.     Medication Adjustments/Labs and Tests Ordered: Current medicines are reviewed at length with the patient today.  Concerns regarding medicines are outlined above.   Tests Ordered: No orders of the defined types were placed in this encounter.   Medication Changes: Meds ordered this encounter  Medications   doxycycline (VIBRA-TABS) 100 MG tablet    Sig: Take 1 tablet (100 mg total) by mouth 2 (two) times daily.    Dispense:  20 tablet    Refill:  0    Order Specific Question:   Supervising Provider    Answer:   Corey Harold   predniSONE (DELTASONE) 20 MG tablet    Sig: Take 3 tablets (60 mg total) by mouth daily with breakfast for 3 days, THEN 2 tablets (40 mg total) daily with breakfast for 3 days, THEN 1 tablet (20 mg total) daily with breakfast for 3 days.    Dispense:  18 tablet    Refill:  0    Order Specific Question:   Supervising Provider    AnswerCorey Harold    Follow Up:   In Person prn  Signed, Vickey Sages, PA-C  10/08/2021 9:16 AM    [facility]

## 2021-10-10 ENCOUNTER — Encounter: Payer: Self-pay | Admitting: Physician Assistant

## 2021-10-15 ENCOUNTER — Encounter: Payer: Self-pay | Admitting: Physician Assistant

## 2021-10-15 ENCOUNTER — Ambulatory Visit (INDEPENDENT_AMBULATORY_CARE_PROVIDER_SITE_OTHER): Payer: Medicare HMO | Admitting: Physician Assistant

## 2021-10-15 DIAGNOSIS — J060 Acute laryngopharyngitis: Secondary | ICD-10-CM

## 2021-10-15 MED ORDER — BENZONATATE 100 MG PO CAPS
100.0000 mg | ORAL_CAPSULE | Freq: Three times a day (TID) | ORAL | 3 refills | Status: DC | PRN
Start: 2021-10-15 — End: 2021-11-16

## 2021-10-15 NOTE — Progress Notes (Signed)
Acute Office Visit  Subjective:    [name], female    DOB: 1956/08/01, 65 y.o.   MRN: [id number]  Chief Complaint  Patient presents with   Cough    HPI: Patient is in today for follow up of bronchitis - states overall she is actually doing better and feeling good - she finished her doxycycline today She has been using tessalon perles which is helping with her dry cough Denies any other symptoms or problems today  Past Medical History:  Diagnosis Date   GERD (gastroesophageal reflux disease)    Major depressive disorder, single episode, moderate (HCC)    Mild intermittent asthma with (acute) exacerbation    Nontoxic single thyroid nodule     Past Surgical History:  Procedure Laterality Date   [place]   X2   THYROIDECTOMY Right     Family History  Problem Relation Age of Onset   Lung cancer Mother    COPD Mother    Heart attack Father    Depression Sister    Suicidality Sister    Lung cancer Brother    COPD Brother    Breast cancer Neg Hx     Social History   Socioeconomic History   Marital status: Married    Spouse name: Not on file   Number of children: 2   Years of education: Not on file   Highest education level: Not on file  Occupational History   Not on file  Tobacco Use   Smoking status: Never   Smokeless tobacco: Never  Vaping Use   Vaping Use: Never used  Substance and Sexual Activity   Alcohol use: Not Currently   Drug use: Never   Sexual activity: Not on file  Other Topics Concern   Not on file  Social History Narrative   Not on file   Social Determinants of Health   Financial Resource Strain: Low Risk    Difficulty of Paying Living Expenses: Not hard at all  Food Insecurity: No Food Insecurity   Worried About Charity fundraiser in the Last Year: Never true   Ran Out of Food in the Last Year: Never true  Transportation Needs: No Transportation Needs   Lack of Transportation (Medical): No    Lack of Transportation (Non-Medical): No  Physical Activity: Insufficiently Active   Days of Exercise per Week: 3 days   Minutes of Exercise per Session: 30 min  Stress: No Stress Concern Present   Feeling of Stress : Not at all  Social Connections: Socially Integrated   Frequency of Communication with Friends and Family: More than three times a week   Frequency of Social Gatherings with Friends and Family: Twice a week   Attends Religious Services: More than 4 times per year   Active Member of [employer] or Organizations: Yes   Attends Music therapist: More than 4 times per year   Marital Status: Married  Human resources officer Violence: Not At Risk   Fear of Current or Ex-Partner: No   Emotionally Abused: No   Physically Abused: No   Sexually Abused: No    Outpatient Medications Prior to Visit  Medication Sig Dispense Refill   albuterol (VENTOLIN HFA) 108 (90 Base) MCG/ACT inhaler Inhale 2 puffs into the lungs every 4 (four) hours as needed for wheezing or shortness of breath. 18 g 2   celecoxib (CELEBREX) 200 MG capsule Take 1 capsule (200 mg total) by mouth daily. [place]  capsule 0   fluticasone (FLONASE) 50 MCG/ACT nasal spray Place 2 sprays into both nostrils daily. 16 g 3   metoprolol succinate (TOPROL-XL) 25 MG 24 hr tablet Take 1 tablet (25 mg total) by mouth daily. 90 tablet 3   montelukast (SINGULAIR) 10 MG tablet Take 10 mg by mouth daily.     pantoprazole (PROTONIX) 40 MG tablet Take 1 tablet (40 mg total) by mouth daily. 90 tablet 1   venlafaxine XR (EFFEXOR-XR) 37.5 MG 24 hr capsule TAKE 1 CAPSULE BY MOUTH EVERY DAY 90 capsule 1   Vitamin D, Ergocalciferol, (DRISDOL) 1.25 MG (50000 UNIT) CAPS capsule Take 1 capsule (50,000 Units total) by mouth every 7 (seven) days. 12 capsule 1   benzonatate (TESSALON) 100 MG capsule Take 1 capsule (100 mg total) by mouth 2 (two) times daily as needed for cough. 20 capsule 0   doxycycline (VIBRA-TABS) 100 MG tablet Take 1 tablet (100 mg  total) by mouth 2 (two) times daily. 20 tablet 0   predniSONE (DELTASONE) 20 MG tablet Take 3 tablets (60 mg total) by mouth daily with breakfast for 3 days, THEN 2 tablets (40 mg total) daily with breakfast for 3 days, THEN 1 tablet (20 mg total) daily with breakfast for 3 days. 18 tablet 0   No facility-administered medications prior to visit.    No Known Allergies  Review of Systems CONSTITUTIONAL: Negative for chills, fatigue, fever, unintentional weight gain and unintentional weight loss.  E/N/T: Negative for ear pain, nasal congestion and sore throat.  CARDIOVASCULAR: Negative for chest pain, dizziness, palpitations and pedal edema.  RESPIRATORY: see HPI         Objective:    Physical Exam PHYSICAL EXAM:   VS: BP 130/66 (BP Location: Left Arm, Patient Position: Sitting, Cuff Size: Large)    Pulse 76    Temp (!) 96.8 F (36 C) (Temporal)    Ht 5' (1.524 m)    Wt 156 lb (70.8 kg)    SpO2 96%    BMI 30.47 kg/m   GEN: Well nourished, well developed, in no acute distress  HEENT: normal external ears and nose - normal external auditory canals and TMS -  - Lips, Teeth and Gums - normal  Oropharynx - normal mucosa, palate, and posterior pharynx Cardiac: RRR; no murmurs, rubs, or gallops,no edema - Respiratory:  normal respiratory rate and pattern with no distress - normal breath sounds with no rales, rhonchi, wheezes or rubs   BP 130/66 (BP Location: Left Arm, Patient Position: Sitting, Cuff Size: Large)    Pulse 76    Temp (!) 96.8 F (36 C) (Temporal)    Ht 5' (1.524 m)    Wt 156 lb (70.8 kg)    SpO2 96%    BMI 30.47 kg/m  Wt Readings from Last 3 Encounters:  10/15/21 156 lb (70.8 kg)  10/08/21 159 lb (72.1 kg)  10/04/21 159 lb (72.1 kg)    Health Maintenance Due  Topic Date Due   TETANUS/TDAP  Never done   Zoster Vaccines- Shingrix (1 of 2) Never done    There are no preventive care reminders to display for this patient.   Lab Results  Component Value Date   TSH  2.130 02/15/2021   Lab Results  Component Value Date   WBC 6.3 09/18/2021   HGB 14.2 09/18/2021   HCT 43.0 09/18/2021   MCV 92 09/18/2021   PLT 212 09/18/2021   Lab Results  Component Value Date   NA  143 09/18/2021   K 4.7 09/18/2021   CO2 25 09/18/2021   GLUCOSE 99 09/18/2021   BUN 13 09/18/2021   CREATININE 0.64 09/18/2021   BILITOT 0.3 09/18/2021   ALKPHOS 76 09/18/2021   AST 16 09/18/2021   ALT 16 09/18/2021   PROT 6.9 09/18/2021   ALBUMIN 4.5 09/18/2021   CALCIUM 9.3 09/18/2021   EGFR 97 09/18/2021   Lab Results  Component Value Date   CHOL 194 09/18/2021   Lab Results  Component Value Date   HDL 57 09/18/2021   Lab Results  Component Value Date   LDLCALC 126 (H) 09/18/2021   Lab Results  Component Value Date   TRIG 62 09/18/2021   Lab Results  Component Value Date   CHOLHDL 3.4 09/18/2021   No results found for: HGBA1C     Assessment & Plan:   Problem List Items Addressed This Visit   None Visit Diagnoses     Acute laryngopharyngitis       Relevant Medications   benzonatate (TESSALON) 100 MG capsule      Meds ordered this encounter  Medications   benzonatate (TESSALON) 100 MG capsule    Sig: Take 1 capsule (100 mg total) by mouth 3 (three) times daily as needed for cough.    Dispense:  30 capsule    Refill:  3    Order Specific Question:   Supervising Provider    Answer:   Shelton Silvas    No orders of the defined types were placed in this encounter.    Follow-up: Return if symptoms worsen or fail to improve.  An After Visit Summary was printed and given to the patient.  Yetta Flock [facility] [phone]

## 2021-10-31 ENCOUNTER — Telehealth: Payer: Self-pay

## 2021-10-31 NOTE — Progress Notes (Addendum)
° ° °  Chronic Care Management Pharmacy Assistant   Name: Brittany Short   MRN: [id number]   DOB: [birth year]   Reason for Encounter: Disease State/Hypertension Adherence Call    Recent office visits: None  10/15/2021 Marianne Sofia PA (PCP) Change Benzonatate 100 mg oral 3 times a day  10/08/2021 Marianne Sofia PA (PCP) Start Doxycycline Hyclate 100 mg oral 2 times a day, Prednisone 20 mg Take 3 tablets (60 mg total) by mouth daily with breakfast for 3 days, THEN 2 tablets (40 mg total) daily with breakfast for 3 days, THEN 1 tablet (20 mg total) daily with breakfast for 3 days.  10/04/2021 Marianne Sofia PA (PCP) Start Azithromycin 250 MG Take 2 tablets on day 1, then 1 tablet daily on days 2 through 5, Benzonatate 100 mg Oral 2 times daily PRN  Recent consult visits: None   Hospital visits:  None in previous 6 months  Medications: Outpatient Encounter Medications as of 10/31/2021  Medication Sig   albuterol (VENTOLIN HFA) 108 (90 Base) MCG/ACT inhaler Inhale 2 puffs into the lungs every 4 (four) hours as needed for wheezing or shortness of breath.   benzonatate (TESSALON) 100 MG capsule Take 1 capsule (100 mg total) by mouth 3 (three) times daily as needed for cough.   celecoxib (CELEBREX) 200 MG capsule Take 1 capsule (200 mg total) by mouth daily.   fluticasone (FLONASE) 50 MCG/ACT nasal spray Place 2 sprays into both nostrils daily.   metoprolol succinate (TOPROL-XL) 25 MG 24 hr tablet Take 1 tablet (25 mg total) by mouth daily.   montelukast (SINGULAIR) 10 MG tablet Take 10 mg by mouth daily.   pantoprazole (PROTONIX) 40 MG tablet Take 1 tablet (40 mg total) by mouth daily.   venlafaxine XR (EFFEXOR-XR) 37.5 MG 24 hr capsule TAKE 1 CAPSULE BY MOUTH EVERY DAY   Vitamin D, Ergocalciferol, (DRISDOL) 1.25 MG (50000 UNIT) CAPS capsule Take 1 capsule (50,000 Units total) by mouth every 7 (seven) days.   No facility-administered encounter medications on file as of 10/31/2021.    Recent Office  Vitals: BP Readings from Last 3 Encounters:  10/15/21 130/66  10/04/21 (!) 150/62  09/18/21 126/72   Pulse Readings from Last 3 Encounters:  10/15/21 76  10/04/21 76  09/18/21 71    Wt Readings from Last 3 Encounters:  10/15/21 156 lb (70.8 kg)  10/08/21 159 lb (72.1 kg)  10/04/21 159 lb (72.1 kg)     Kidney Function Lab Results  Component Value Date/Time   CREATININE 0.64 09/18/2021 10:19 AM   CREATININE 0.68 02/15/2021 10:07 AM   GFRNONAA 94 10/18/2020 02:34 PM   GFRAA 108 10/18/2020 02:34 PM    BMP Latest Ref Rng & Units 09/18/2021 02/15/2021 10/18/2020  Glucose 70 - 99 mg/dL 99 97 90  BUN 8 - 27 mg/dL 13 13 18   Creatinine 0.57 - 1.00 mg/dL 7.82 9.56  BUN/Creat Ratio 12 - 28 20 19 28   Sodium 134 - 144 mmol/L 143 142 143  Potassium 3.5 - 5.2 mmol/L 4.7 4.7 3.9  Chloride 96 - 106 mmol/L 104 102 106  CO2 20 - 29 mmol/L 25 25 25   Calcium 8.7 - 10.3 mg/dL 9.3 9.3 8.9    Called patient 10/31/20 left voicemail Called patient 11/01/20 left voicemail Called patient 11/05/20 left voicemail   Care Gaps:  Last annual wellness visit - 09/18/2021  Star Rating Drugs:  None  12/30/20 Clinical Pharmacist Assistant [phone]

## 2021-11-16 ENCOUNTER — Encounter: Payer: Self-pay | Admitting: Physician Assistant

## 2021-11-16 ENCOUNTER — Ambulatory Visit (INDEPENDENT_AMBULATORY_CARE_PROVIDER_SITE_OTHER): Payer: Medicare HMO | Admitting: Physician Assistant

## 2021-11-16 VITALS — BP 122/62 | HR 66 | Temp 98.5°F | Ht 60.0 in | Wt 159.0 lb

## 2021-11-16 DIAGNOSIS — J40 Bronchitis, not specified as acute or chronic: Secondary | ICD-10-CM

## 2021-11-16 DIAGNOSIS — J060 Acute laryngopharyngitis: Secondary | ICD-10-CM | POA: Diagnosis not present

## 2021-11-16 DIAGNOSIS — R059 Cough, unspecified: Secondary | ICD-10-CM | POA: Diagnosis not present

## 2021-11-16 DIAGNOSIS — R079 Chest pain, unspecified: Secondary | ICD-10-CM | POA: Diagnosis not present

## 2021-11-16 DIAGNOSIS — R062 Wheezing: Secondary | ICD-10-CM | POA: Diagnosis not present

## 2021-11-16 MED ORDER — BENZONATATE 100 MG PO CAPS: 100.0000 mg | ORAL_CAPSULE | Freq: Three times a day (TID) | ORAL | 3 refills | Status: DC | PRN

## 2021-11-16 MED ORDER — MONTELUKAST SODIUM 10 MG PO TABS: 10.0000 mg | ORAL_TABLET | Freq: Every day | ORAL | 1 refills | Status: DC

## 2021-11-16 MED ORDER — PREDNISONE 20 MG PO TABS: ORAL_TABLET | ORAL | 0 refills | Status: AC

## 2021-11-16 MED ORDER — AMOXICILLIN 875 MG PO TABS: 875.0000 mg | ORAL_TABLET | Freq: Two times a day (BID) | ORAL | 0 refills | Status: DC

## 2021-11-16 NOTE — Progress Notes (Signed)
Acute Office Visit  Subjective:    [name], female    DOB: [birth year], 66 y.o.   MRN: [id number]  Chief Complaint  Patient presents with   Cough    HPI: Patient is in today for complaints of continued cough - she has been treated for uri/cough symptoms about a month ago and states she was starting to do better and now cough has recurred - is having some mild wheezing at night as well She has been out of her singulair for a few months - she is using albuterol inhaler as needed  Past Medical History:  Diagnosis Date   GERD (gastroesophageal reflux disease)    Major depressive disorder, single episode, moderate (HCC)    Mild intermittent asthma with (acute) exacerbation    Nontoxic single thyroid nodule     Past Surgical History:  Procedure Laterality Date   CESAREAN SECTION  1982 & 1998   X2   THYROIDECTOMY Right     Family History  Problem Relation Age of Onset   Lung cancer Mother    COPD Mother    Heart attack Father    Depression Sister    Suicidality Sister    Lung cancer Brother    COPD Brother    Breast cancer Neg Hx     Social History   Socioeconomic History   Marital status: Married    Spouse name: Not on file   Number of children: 2   Years of education: Not on file   Highest education level: Not on file  Occupational History   Not on file  Tobacco Use   Smoking status: Never   Smokeless tobacco: Never  Vaping Use   Vaping Use: Never used  Substance and Sexual Activity   Alcohol use: Not Currently   Drug use: Never   Sexual activity: Not on file  Other Topics Concern   Not on file  Social History Narrative   Not on file   Social Determinants of Health   Financial Resource Strain: Low Risk    Difficulty of Paying Living Expenses: Not hard at all  Food Insecurity: No Food Insecurity   Worried About Charity fundraiser in the Last Year: Never true   Ran Out of Food in the Last Year: Never true  Transportation Needs: No  Transportation Needs   Lack of Transportation (Medical): No   Lack of Transportation (Non-Medical): No  Physical Activity: Insufficiently Active   Days of Exercise per Week: 3 days   Minutes of Exercise per Session: 30 min  Stress: No Stress Concern Present   Feeling of Stress : Not at all  Social Connections: Socially Integrated   Frequency of Communication with Friends and Family: More than three times a week   Frequency of Social Gatherings with Friends and Family: Twice a week   Attends Religious Services: More than 4 times per year   Active Member of [employer] or Organizations: Yes   Attends Music therapist: More than 4 times per year   Marital Status: Married  Human resources officer Violence: Not At Risk   Fear of Current or Ex-Partner: No   Emotionally Abused: No   Physically Abused: No   Sexually Abused: No    Outpatient Medications Prior to Visit  Medication Sig Dispense Refill   albuterol (VENTOLIN HFA) 108 (90 Base) MCG/ACT inhaler Inhale 2 puffs into the lungs every 4 (four) hours as needed for wheezing or shortness of breath. 18 g 2  celecoxib (CELEBREX) 200 MG capsule Take 1 capsule (200 mg total) by mouth daily. 90 capsule 0   fluticasone (FLONASE) 50 MCG/ACT nasal spray Place 2 sprays into both nostrils daily. 16 g 3   metoprolol succinate (TOPROL-XL) 25 MG 24 hr tablet Take 1 tablet (25 mg total) by mouth daily. 90 tablet 3   pantoprazole (PROTONIX) 40 MG tablet Take 1 tablet (40 mg total) by mouth daily. 90 tablet 1   venlafaxine XR (EFFEXOR-XR) 37.5 MG 24 hr capsule TAKE 1 CAPSULE BY MOUTH EVERY DAY 90 capsule 1   Vitamin D, Ergocalciferol, (DRISDOL) 1.25 MG (50000 UNIT) CAPS capsule Take 1 capsule (50,000 Units total) by mouth every 7 (seven) days. 12 capsule 1   benzonatate (TESSALON) 100 MG capsule Take 1 capsule (100 mg total) by mouth 3 (three) times daily as needed for cough. 30 capsule 3   montelukast (SINGULAIR) 10 MG tablet Take 10 mg by mouth daily.      No facility-administered medications prior to visit.    No Known Allergies  Review of Systems CONSTITUTIONAL: Negative for chills, fatigue, fever, unintentional weight gain and unintentional weight loss.  E/N/T: Negative for ear pain, nasal congestion and sore throat.  CARDIOVASCULAR: Negative for chest pain, dizziness, palpitations and pedal edema.  RESPIRATORY: see HPI GASTROINTESTINAL: Negative for abdominal pain, acid reflux symptoms, constipation, diarrhea, nausea and vomiting.       Objective:    Physical Exam PHYSICAL EXAM:   VS: BP 122/62 (BP Location: Right Arm, Patient Position: Sitting)    Pulse 66    Temp 98.5 F (36.9 C) (Temporal)    Ht 5' (1.524 m)    Wt 159 lb (72.1 kg)    SpO2 97%    BMI 31.05 kg/m   GEN: Well nourished, well developed, in no acute distress  HEENT: normal external ears and nose - normal external auditory canals and TMS -  - Lips, Teeth and Gums - normal  Oropharynx - normal mucosa, palate, and posterior pharynx Cardiac: RRR; no murmurs, rubs, or gallops,no edema -  Respiratory: faint exp wheezes noted Skin: warm and dry, no rash  Psych: euthymic mood, appropriate affect and demeanor  BP 122/62 (BP Location: Right Arm, Patient Position: Sitting)    Pulse 66    Temp 98.5 F (36.9 C) (Temporal)    Ht 5' (1.524 m)    Wt 159 lb (72.1 kg)    SpO2 97%    BMI 31.05 kg/m  Wt Readings from Last 3 Encounters:  11/16/21 159 lb (72.1 kg)  10/15/21 156 lb (70.8 kg)  10/08/21 159 lb (72.1 kg)    Health Maintenance Due  Topic Date Due   TETANUS/TDAP  Never done   Zoster Vaccines- Shingrix (1 of 2) Never done    There are no preventive care reminders to display for this patient.   Lab Results  Component Value Date   TSH 2.130 02/15/2021   Lab Results  Component Value Date   WBC 6.3 09/18/2021   HGB 14.2 09/18/2021   HCT 43.0 09/18/2021   MCV 92 09/18/2021   PLT 212 09/18/2021   Lab Results  Component Value Date   NA 143 09/18/2021    K 4.7 09/18/2021   CO2 25 09/18/2021   GLUCOSE 99 09/18/2021   BUN 13 09/18/2021   CREATININE 0.64 09/18/2021   BILITOT 0.3 09/18/2021   ALKPHOS 76 09/18/2021   AST 16 09/18/2021   ALT 16 09/18/2021   PROT 6.9 09/18/2021  ALBUMIN 4.5 09/18/2021   CALCIUM 9.3 09/18/2021   EGFR 97 09/18/2021   Lab Results  Component Value Date   CHOL 194 09/18/2021   Lab Results  Component Value Date   HDL 57 09/18/2021   Lab Results  Component Value Date   LDLCALC 126 (H) 09/18/2021   Lab Results  Component Value Date   TRIG 62 09/18/2021   Lab Results  Component Value Date   CHOLHDL 3.4 09/18/2021   No results found for: HGBA1C     Assessment & Plan:   Problem List Items Addressed This Visit   None Visit Diagnoses     Bronchitis    -  Primary   Relevant Medications   predniSONE (DELTASONE) 20 MG tablet   amoxicillin (AMOXIL) 875 MG tablet   Other Relevant Orders   DG Chest 2 View   Acute laryngopharyngitis       Relevant Medications   montelukast (SINGULAIR) 10 MG tablet   benzonatate (TESSALON) 100 MG capsule      Meds ordered this encounter  Medications   predniSONE (DELTASONE) 20 MG tablet    Sig: Take 3 tablets (60 mg total) by mouth daily with breakfast for 3 days, THEN 2 tablets (40 mg total) daily with breakfast for 3 days, THEN 1 tablet (20 mg total) daily with breakfast for 3 days.    Dispense:  18 tablet    Refill:  0    Order Specific Question:   Supervising Provider    Answer:   Shelton Silvas   amoxicillin (AMOXIL) 329 MG tablet    Sig: Take 1 tablet (875 mg total) by mouth 2 (two) times daily.    Dispense:  20 tablet    Refill:  0    Order Specific Question:   Supervising Provider    Answer:   COX, KIRSTEN [[id number]]   montelukast (SINGULAIR) 10 MG tablet    Sig: Take 1 tablet (10 mg total) by mouth daily.    Dispense:  90 tablet    Refill:  1    Order Specific Question:   Supervising Provider    Answer:   Shelton Silvas    benzonatate (TESSALON) 100 MG capsule    Sig: Take 1 capsule (100 mg total) by mouth 3 (three) times daily as needed for cough.    Dispense:  30 capsule    Refill:  3    Order Specific Question:   Supervising Provider    AnswerShelton Silvas    Orders Placed This Encounter  Procedures   DG Chest 2 View     Follow-up: Return if symptoms worsen or fail to improve.  An After Visit Summary was printed and given to the patient.  Yetta Flock [facility] [phone]

## 2021-11-20 ENCOUNTER — Other Ambulatory Visit: Payer: Self-pay

## 2021-11-20 MED ORDER — ATORVASTATIN CALCIUM 10 MG PO TABS: 10.0000 mg | ORAL_TABLET | Freq: Every day | ORAL | 1 refills | Status: DC

## 2021-11-26 ENCOUNTER — Other Ambulatory Visit: Payer: Self-pay

## 2021-11-26 DIAGNOSIS — J40 Bronchitis, not specified as acute or chronic: Secondary | ICD-10-CM

## 2021-11-30 ENCOUNTER — Encounter: Payer: Self-pay | Admitting: Physician Assistant

## 2021-11-30 ENCOUNTER — Telehealth (INDEPENDENT_AMBULATORY_CARE_PROVIDER_SITE_OTHER): Payer: Medicare HMO | Admitting: Physician Assistant

## 2021-11-30 VITALS — BP 140/80 | HR 92 | Temp 98.0°F | Ht 60.0 in | Wt 160.0 lb

## 2021-11-30 DIAGNOSIS — U071 COVID-19: Secondary | ICD-10-CM | POA: Diagnosis not present

## 2021-11-30 MED ORDER — MOLNUPIRAVIR EUA 200MG CAPSULE: 4.0000 | ORAL_CAPSULE | Freq: Two times a day (BID) | ORAL | 0 refills | Status: AC

## 2021-11-30 MED ORDER — PROMETHAZINE-DM 6.25-15 MG/5ML PO SYRP: 5.0000 mL | ORAL_SOLUTION | Freq: Four times a day (QID) | ORAL | 0 refills | Status: DC | PRN

## 2021-11-30 NOTE — Progress Notes (Signed)
Virtual Visit via Telephone Note   This visit type was conducted due to national recommendations for restrictions regarding the COVID-19 Pandemic (e.g. social distancing) in an effort to limit this patient's exposure and mitigate transmission in our community.  Due to her co-morbid illnesses, this patient is at least at moderate risk for complications without adequate follow up.  This format is felt to be most appropriate for this patient at this time.  The patient did not have access to video technology/had technical difficulties with video requiring transitioning to audio format only (telephone).  All issues noted in this document were discussed and addressed.  No physical exam could be performed with this format.  Patient verbally consented to a telehealth visit.   Date:  11/30/2021   ID:  Brittany Short, DOB 21-Jan-1956, MRN [id number]  Patient Location: Home Provider Location: Office  PCP:  Marianne Sofia, PA-C     Chief Complaint:  COVID  History of Present Illness:    Brittany Short is a 66 y.o. female with complaints of testing positive for COVID this morning - last night had tested negative - complains of cough, congestion and malaise - she has also had a low grade fever  The patient does have symptoms concerning for COVID-19 infection (fever, chills, cough, or new shortness of breath).    Past Medical History:  Diagnosis Date   GERD (gastroesophageal reflux disease)    Major depressive disorder, single episode, moderate (HCC)    Mild intermittent asthma with (acute) exacerbation    Nontoxic single thyroid nodule    Past Surgical History:  Procedure Laterality Date   CESAREAN SECTION  1982 & 1998   X2   THYROIDECTOMY Right      Current Meds  Medication Sig   albuterol (VENTOLIN HFA) 108 (90 Base) MCG/ACT inhaler Inhale 2 puffs into the lungs every 4 (four) hours as needed for wheezing or shortness of breath.   atorvastatin (LIPITOR) 10 MG tablet Take 1 tablet (10 mg  total) by mouth daily.   benzonatate (TESSALON) 100 MG capsule Take 1 capsule (100 mg total) by mouth 3 (three) times daily as needed for cough.   celecoxib (CELEBREX) 200 MG capsule Take 1 capsule (200 mg total) by mouth daily.   fluticasone (FLONASE) 50 MCG/ACT nasal spray Place 2 sprays into both nostrils daily.   metoprolol succinate (TOPROL-XL) 25 MG 24 hr tablet Take 1 tablet (25 mg total) by mouth daily.   molnupiravir EUA (LAGEVRIO) 200 mg CAPS capsule Take 4 capsules (800 mg total) by mouth 2 (two) times daily for 5 days.   montelukast (SINGULAIR) 10 MG tablet Take 1 tablet (10 mg total) by mouth daily.   pantoprazole (PROTONIX) 40 MG tablet Take 1 tablet (40 mg total) by mouth daily.   promethazine-dextromethorphan (PROMETHAZINE-DM) 6.25-15 MG/5ML syrup Take 5 mLs by mouth 4 (four) times daily as needed for cough.   venlafaxine XR (EFFEXOR-XR) 37.5 MG 24 hr capsule TAKE 1 CAPSULE BY MOUTH EVERY DAY   Vitamin D, Ergocalciferol, (DRISDOL) 1.25 MG (50000 UNIT) CAPS capsule Take 1 capsule (50,000 Units total) by mouth every 7 (seven) days.   [DISCONTINUED] amoxicillin (AMOXIL) 875 MG tablet Take 1 tablet (875 mg total) by mouth 2 (two) times daily.     Allergies:   Patient has no known allergies.   Social History   Tobacco Use   Smoking status: Never   Smokeless tobacco: Never  Vaping Use   Vaping Use: Never used  Substance Use Topics  Alcohol use: Not Currently   Drug use: Never     Family Hx: The patient's family history includes COPD in her brother and mother; Depression in her sister; Heart attack in her father; Lung cancer in her brother and mother; Suicidality in her sister. There is no history of Breast cancer.  ROS:   Please see the history of present illness.    All other systems reviewed and are negative.  Labs/Other Tests and Data Reviewed:    Recent Labs: 02/15/2021: TSH 2.130 09/18/2021: ALT 16; BUN 13; Creatinine, Ser 0.64; Hemoglobin 14.2; Platelets 212;  Potassium 4.7; Sodium 143   Recent Lipid Panel Lab Results  Component Value Date/Time   CHOL 194 09/18/2021 10:19 AM   TRIG 62 09/18/2021 10:19 AM   HDL 57 09/18/2021 10:19 AM   CHOLHDL 3.4 09/18/2021 10:19 AM   LDLCALC 126 (H) 09/18/2021 10:19 AM    Wt Readings from Last 3 Encounters:  11/30/21 160 lb (72.6 kg)  11/16/21 159 lb (72.1 kg)  10/15/21 156 lb (70.8 kg)     Objective:    Vital Signs:  BP 140/80    Pulse 92    Temp 98 F (36.7 C)    Ht 5' (1.524 m)    Wt 160 lb (72.6 kg)    SpO2 94%    BMI 31.25 kg/m    VITAL SIGNS:  reviewed GEN:  no acute distress  ASSESSMENT & PLAN:    COVID --- rx for phenergan DM cough syrup and molnupiravir - recommend pt not take cholesterol medication during treatment -- she is to quarantine according to CDC guidelines and follow up if any symptoms change or worsen  COVID-19 Education: The signs and symptoms of COVID-19 were discussed with the patient and how to seek care for testing (follow up with PCP or arrange E-visit). The importance of social distancing was discussed today.  Time:   Today, I have spent 10 minutes with the patient with telehealth technology discussing the above problems.     Medication Adjustments/Labs and Tests Ordered: Current medicines are reviewed at length with the patient today.  Concerns regarding medicines are outlined above.   Tests Ordered: No orders of the defined types were placed in this encounter.   Medication Changes: Meds ordered this encounter  Medications   molnupiravir EUA (LAGEVRIO) 200 mg CAPS capsule    Sig: Take 4 capsules (800 mg total) by mouth 2 (two) times daily for 5 days.    Dispense:  40 capsule    Refill:  0    Order Specific Question:   Supervising Provider    Answer:   Corey Harold   promethazine-dextromethorphan (PROMETHAZINE-DM) 6.25-15 MG/5ML syrup    Sig: Take 5 mLs by mouth 4 (four) times daily as needed for cough.    Dispense:  118 mL    Refill:  0     Order Specific Question:   Supervising Provider    AnswerCorey Harold    Follow Up:  In Person prn  Signed, Jettie Pagan  11/30/2021 9:31 AM    [facility]

## 2021-12-03 ENCOUNTER — Telehealth: Payer: Self-pay

## 2021-12-03 NOTE — Telephone Encounter (Signed)
Does she have a nebulizer at home? - has she used before?

## 2021-12-03 NOTE — Telephone Encounter (Signed)
Yes she does and has.   Brittany Short, [place] 12/03/21 4:09 PM

## 2021-12-03 NOTE — Telephone Encounter (Signed)
Patient requesting albuterol neb solution as she has covid and feels this will help.   Lorita Officer, CCMA 12/03/21 2:30 PM

## 2021-12-04 ENCOUNTER — Other Ambulatory Visit: Payer: Self-pay | Admitting: Physician Assistant

## 2021-12-04 MED ORDER — ALBUTEROL SULFATE (2.5 MG/3ML) 0.083% IN NEBU: 2.5000 mg | INHALATION_SOLUTION | Freq: Four times a day (QID) | RESPIRATORY_TRACT | 3 refills | Status: DC | PRN

## 2021-12-04 NOTE — Telephone Encounter (Signed)
Will send in 

## 2021-12-11 ENCOUNTER — Other Ambulatory Visit: Payer: Self-pay | Admitting: Physician Assistant

## 2021-12-11 ENCOUNTER — Ambulatory Visit (INDEPENDENT_AMBULATORY_CARE_PROVIDER_SITE_OTHER): Payer: Medicare HMO | Admitting: Physician Assistant

## 2021-12-11 ENCOUNTER — Other Ambulatory Visit: Payer: Self-pay

## 2021-12-11 ENCOUNTER — Encounter: Payer: Self-pay | Admitting: Physician Assistant

## 2021-12-11 VITALS — BP 134/80 | HR 67 | Temp 97.3°F | Ht 60.0 in | Wt 162.6 lb

## 2021-12-11 DIAGNOSIS — N3001 Acute cystitis with hematuria: Secondary | ICD-10-CM

## 2021-12-11 DIAGNOSIS — N390 Urinary tract infection, site not specified: Secondary | ICD-10-CM | POA: Diagnosis not present

## 2021-12-11 DIAGNOSIS — K219 Gastro-esophageal reflux disease without esophagitis: Secondary | ICD-10-CM

## 2021-12-11 LAB — POCT URINALYSIS DIP (CLINITEK)
Bilirubin, UA: NEGATIVE
Blood, UA: NEGATIVE
Glucose, UA: NEGATIVE mg/dL
Ketones, POC UA: NEGATIVE mg/dL
Nitrite, UA: NEGATIVE
POC PROTEIN,UA: NEGATIVE
Spec Grav, UA: 1.01 (ref 1.010–1.025)
Urobilinogen, UA: 0.2 E.U./dL
pH, UA: 6.5 (ref 5.0–8.0)

## 2021-12-11 MED ORDER — NITROFURANTOIN MONOHYD MACRO 100 MG PO CAPS: 100.0000 mg | ORAL_CAPSULE | Freq: Two times a day (BID) | ORAL | 0 refills | Status: DC

## 2021-12-11 NOTE — Progress Notes (Signed)
Acute Office Visit  Subjective:    [name], female    DOB: 04/15/1956, 66 y.o.   MRN: [id number]  Chief Complaint  Patient presents with   Urinary Tract Infection    HPI: Patient is in today for UTI - she states that yesterday she started having dysuria, urgency and urine frequency Denies fever, nausea or vomiting  Past Medical History:  Diagnosis Date   GERD (gastroesophageal reflux disease)    Major depressive disorder, single episode, moderate (HCC)    Mild intermittent asthma with (acute) exacerbation    Nontoxic single thyroid nodule     Past Surgical History:  Procedure Laterality Date   [place]   X2   THYROIDECTOMY Right     Family History  Problem Relation Age of Onset   Lung cancer Mother    COPD Mother    Heart attack Father    Depression Sister    Suicidality Sister    Lung cancer Brother    COPD Brother    Breast cancer Neg Hx     Social History   Socioeconomic History   Marital status: Married    Spouse name: Not on file   Number of children: 2   Years of education: Not on file   Highest education level: Not on file  Occupational History   Not on file  Tobacco Use   Smoking status: Never   Smokeless tobacco: Never  Vaping Use   Vaping Use: Never used  Substance and Sexual Activity   Alcohol use: Not Currently   Drug use: Never   Sexual activity: Not on file  Other Topics Concern   Not on file  Social History Narrative   Not on file   Social Determinants of Health   Financial Resource Strain: Low Risk    Difficulty of Paying Living Expenses: Not hard at all  Food Insecurity: No Food Insecurity   Worried About Charity fundraiser in the Last Year: Never true   Ran Out of Food in the Last Year: Never true  Transportation Needs: No Transportation Needs   Lack of Transportation (Medical): No   Lack of Transportation (Non-Medical): No  Physical Activity: Insufficiently Active   Days of  Exercise per Week: 3 days   Minutes of Exercise per Session: 30 min  Stress: No Stress Concern Present   Feeling of Stress : Not at all  Social Connections: Socially Integrated   Frequency of Communication with Friends and Family: More than three times a week   Frequency of Social Gatherings with Friends and Family: Twice a week   Attends Religious Services: More than 4 times per year   Active Member of [employer] or Organizations: Yes   Attends Music therapist: More than 4 times per year   Marital Status: Married  Human resources officer Violence: Not At Risk   Fear of Current or Ex-Partner: No   Emotionally Abused: No   Physically Abused: No   Sexually Abused: No    Outpatient Medications Prior to Visit  Medication Sig Dispense Refill   albuterol (PROVENTIL) (2.5 MG/3ML) 0.083% nebulizer solution Take 3 mLs (2.5 mg total) by nebulization every 6 (six) hours as needed for wheezing or shortness of breath. 75 mL 3   albuterol (VENTOLIN HFA) 108 (90 Base) MCG/ACT inhaler Inhale 2 puffs into the lungs every 4 (four) hours as needed for wheezing or shortness of breath. 18 g 2   atorvastatin (LIPITOR) 10  MG tablet Take 1 tablet (10 mg total) by mouth daily. 90 tablet 1   benzonatate (TESSALON) 100 MG capsule Take 1 capsule (100 mg total) by mouth 3 (three) times daily as needed for cough. 30 capsule 3   celecoxib (CELEBREX) 200 MG capsule Take 1 capsule (200 mg total) by mouth daily. 90 capsule 0   fluticasone (FLONASE) 50 MCG/ACT nasal spray Place 2 sprays into both nostrils daily. 16 g 3   metoprolol succinate (TOPROL-XL) 25 MG 24 hr tablet Take 1 tablet (25 mg total) by mouth daily. 90 tablet 3   montelukast (SINGULAIR) 10 MG tablet Take 1 tablet (10 mg total) by mouth daily. 90 tablet 1   pantoprazole (PROTONIX) 40 MG tablet Take 1 tablet (40 mg total) by mouth daily. 90 tablet 1   promethazine-dextromethorphan (PROMETHAZINE-DM) 6.25-15 MG/5ML syrup Take 5 mLs by mouth 4 (four) times  daily as needed for cough. 118 mL 0   venlafaxine XR (EFFEXOR-XR) 37.5 MG 24 hr capsule TAKE 1 CAPSULE BY MOUTH EVERY DAY 90 capsule 1   Vitamin D, Ergocalciferol, (DRISDOL) 1.25 MG (50000 UNIT) CAPS capsule Take 1 capsule (50,000 Units total) by mouth every 7 (seven) days. 12 capsule 1   No facility-administered medications prior to visit.    No Known Allergies  Review of Systems    CONSTITUTIONAL: Negative for chills, fatigue, fever, unintentional weight gain and unintentional weight loss.  CARDIOVASCULAR: Negative for chest pain, dizziness, palpitations and pedal edema.  RESPIRATORY: Negative for recent cough and dyspnea.  GASTROINTESTINAL: Negative for abdominal pain, acid reflux symptoms, constipation, diarrhea, nausea and vomiting.  GU - see HPI   Objective:  PHYSICAL EXAM:   VS: BP 134/80    Pulse 67    Temp (!) 97.3 F (36.3 C)    Ht 5' (1.524 m)    Wt 162 lb 9.6 oz (73.8 kg)    SpO2 97%    BMI 31.76 kg/m   GEN: Well nourished, well developed, in no acute distress  Cardiac: RRR; no murmurs, rubs, or gallops,no edema -  Respiratory:  normal respiratory rate and pattern with no distress - normal breath sounds with no rales, rhonchi, wheezes or rubs  Psych: euthymic mood, appropriate affect and demeanor  Office Visit on 12/11/2021  Component Date Value Ref Range Status   Color, UA 12/11/2021 yellow  yellow Final   Clarity, UA 12/11/2021 clear  clear Final   Glucose, UA 12/11/2021 negative  negative mg/dL Final   Bilirubin, UA 12/11/2021 negative  negative Final   Ketones, POC UA 12/11/2021 negative  negative mg/dL Final   Spec Grav, UA 12/11/2021 1.010  1.010 - 1.025 Final   Blood, UA 12/11/2021 negative  negative Final   pH, UA 12/11/2021 6.5  5.0 - 8.0 Final   POC PROTEIN,UA 12/11/2021 negative  negative, trace Final   Urobilinogen, UA 12/11/2021 0.2  0.2 or 1.0 E.U./dL Final   Nitrite, UA 12/11/2021 Negative  Negative Final   Leukocytes, UA 12/11/2021 Trace (A)   Negative Final     Health Maintenance Due  Topic Date Due   TETANUS/TDAP  Never done   Zoster Vaccines- Shingrix (1 of 2) Never done    There are no preventive care reminders to display for this patient.   Lab Results  Component Value Date   TSH 2.130 02/15/2021   Lab Results  Component Value Date   WBC 6.3 09/18/2021   HGB 14.2 09/18/2021   HCT 43.0 09/18/2021   MCV 92 09/18/2021  PLT 212 09/18/2021   Lab Results  Component Value Date   NA 143 09/18/2021   K 4.7 09/18/2021   CO2 25 09/18/2021   GLUCOSE 99 09/18/2021   BUN 13 09/18/2021   CREATININE 0.64 09/18/2021   BILITOT 0.3 09/18/2021   ALKPHOS 76 09/18/2021   AST 16 09/18/2021   ALT 16 09/18/2021   PROT 6.9 09/18/2021   ALBUMIN 4.5 09/18/2021   CALCIUM 9.3 09/18/2021   EGFR 97 09/18/2021   Lab Results  Component Value Date   CHOL 194 09/18/2021   Lab Results  Component Value Date   HDL 57 09/18/2021   Lab Results  Component Value Date   LDLCALC 126 (H) 09/18/2021   Lab Results  Component Value Date   TRIG 62 09/18/2021   Lab Results  Component Value Date   CHOLHDL 3.4 09/18/2021   No results found for: HGBA1C     Assessment & Plan:   Problem List Items Addressed This Visit       Genitourinary   UTI (urinary tract infection) - Primary   Relevant Medications   nitrofurantoin, macrocrystal-monohydrate, (MACROBID) 100 MG capsule   Other Relevant Orders   POCT URINALYSIS DIP (CLINITEK) (Completed)   Meds ordered this encounter  Medications   nitrofurantoin, macrocrystal-monohydrate, (MACROBID) 100 MG capsule    Sig: Take 1 capsule (100 mg total) by mouth 2 (two) times daily.    Dispense:  20 capsule    Refill:  0    Order Specific Question:   Supervising Provider    AnswerShelton Silvas    Orders Placed This Encounter  Procedures   POCT URINALYSIS DIP (CLINITEK)     Follow-up: Return if symptoms worsen or fail to improve.  An After Visit Summary was printed and  given to the patient.  Yetta Flock [facility] [phone]

## 2021-12-14 LAB — URINE CULTURE

## 2021-12-28 ENCOUNTER — Ambulatory Visit (INDEPENDENT_AMBULATORY_CARE_PROVIDER_SITE_OTHER): Payer: Medicare HMO

## 2021-12-28 DIAGNOSIS — N3090 Cystitis, unspecified without hematuria: Secondary | ICD-10-CM | POA: Diagnosis not present

## 2021-12-28 LAB — POCT URINALYSIS DIP (CLINITEK)
Bilirubin, UA: NEGATIVE
Blood, UA: NEGATIVE
Glucose, UA: NEGATIVE mg/dL
Ketones, POC UA: NEGATIVE mg/dL
Leukocytes, UA: NEGATIVE
Nitrite, UA: NEGATIVE
POC PROTEIN,UA: NEGATIVE
Spec Grav, UA: 1.01 (ref 1.010–1.025)
Urobilinogen, UA: 0.2 E.U./dL
pH, UA: 6.5 (ref 5.0–8.0)

## 2021-12-28 NOTE — Progress Notes (Signed)
Patient came in for recheck UA.  ?

## 2022-01-03 ENCOUNTER — Telehealth: Payer: Self-pay

## 2022-01-03 NOTE — Chronic Care Management (AMB) (Unsigned)
Chronic Care Management Pharmacy Assistant   Name: Brittany Short  MRN: [id number] DOB: 1956/03/04  Reason for Encounter: Disease State/ Hypertension  Recent office visits:  12-28-2021 Jomarie Longs, CMA. Recheck for UA.  12-11-2021 Marianne Sofia, PA-C. Abnormal UA. START Nitrofurantoin 100 mg twice daily.  11-30-2021 Marianne Sofia, PA-C. START Lagevrio 200 mg 4 capsules twice daily for 5 days and Promethazine DM 5 mLs 4 times daily as needed. COMPLETED amoxicillin course.  11-16-2021 Marianne Sofia, PA-C. START Prednisone 20 mg Take 3 tablets (60 mg total) by mouth daily with breakfast for 3 days, THEN 2 tablets (40 mg total) daily with breakfast for 3 days, THEN 1 tablet (20 mg total) daily with breakfast for 3 days AND Amoxicillin 875 mg twice daily for 10 days. Order placed for DG chest view.  10-15-2021 Marianne Sofia, PA-C. CHANGE Tessalon 100 mg twice daily as needed TO 3 times daily as needed. FINISHED Doxycycline and Prednisone.  10-08-2021 Marianne Sofia, PA-C. FINISHED zithromax. START Prednisone 20 mg Take 3 tablets (60 mg total) by mouth daily with breakfast for 3 days, THEN 2 tablets (40 mg total) daily with breakfast for 3 days, THEN 1 tablet (20 mg total) daily with breakfast for 3 days and Doxycycline 100 mg twice daily for 10 days.  10-04-2021 Marianne Sofia, PA-C. START Zithromax 250 mg Take 2 tablets on day 1, then 1 tablet daily on days 2 through 5 and Tessalon 100 mg twice daily as needed.  09-18-2021 Marianne Sofia, PA-C. Medicare annual wellness visit.   08-17-2021 Marianne Sofia, PA-C. LDL= 126. STOP vitamin D.  Recent consult visits:  None  Hospital visits:  None in previous 6 months  Medications: Outpatient Encounter Medications as of 01/03/2022  Medication Sig   albuterol (PROVENTIL) (2.5 MG/3ML) 0.083% nebulizer solution Take 3 mLs (2.5 mg total) by nebulization every 6 (six) hours as needed for wheezing or shortness of breath.   albuterol (VENTOLIN HFA) 108 (90 Base)  MCG/ACT inhaler Inhale 2 puffs into the lungs every 4 (four) hours as needed for wheezing or shortness of breath.   atorvastatin (LIPITOR) 10 MG tablet Take 1 tablet (10 mg total) by mouth daily.   benzonatate (TESSALON) 100 MG capsule Take 1 capsule (100 mg total) by mouth 3 (three) times daily as needed for cough.   celecoxib (CELEBREX) 200 MG capsule Take 1 capsule (200 mg total) by mouth daily.   fluticasone (FLONASE) 50 MCG/ACT nasal spray Place 2 sprays into both nostrils daily.   metoprolol succinate (TOPROL-XL) 25 MG 24 hr tablet Take 1 tablet (25 mg total) by mouth daily.   montelukast (SINGULAIR) 10 MG tablet Take 1 tablet (10 mg total) by mouth daily.   nitrofurantoin, macrocrystal-monohydrate, (MACROBID) 100 MG capsule Take 1 capsule (100 mg total) by mouth 2 (two) times daily.   pantoprazole (PROTONIX) 40 MG tablet TAKE 1 TABLET BY MOUTH ONCE DAILY.   promethazine-dextromethorphan (PROMETHAZINE-DM) 6.25-15 MG/5ML syrup Take 5 mLs by mouth 4 (four) times daily as needed for cough.   venlafaxine XR (EFFEXOR-XR) 37.5 MG 24 hr capsule TAKE 1 CAPSULE BY MOUTH EVERY DAY   Vitamin D, Ergocalciferol, (DRISDOL) 1.25 MG (50000 UNIT) CAPS capsule Take 1 capsule (50,000 Units total) by mouth every 7 (seven) days.   No facility-administered encounter medications on file as of 01/03/2022.   Recent Office Vitals: BP Readings from Last 3 Encounters:  12/11/21 134/80  11/30/21 140/80  11/16/21 122/62   Pulse Readings from Last 3 Encounters:  12/11/21 67  11/30/21 92  11/16/21 66    Wt Readings from Last 3 Encounters:  12/11/21 162 lb 9.6 oz (73.8 kg)  11/30/21 160 lb (72.6 kg)  11/16/21 159 lb (72.1 kg)     Kidney Function Lab Results  Component Value Date/Time   CREATININE 0.64 09/18/2021 10:19 AM   CREATININE 0.68 02/15/2021 10:07 AM   GFRNONAA 94 10/18/2020 02:34 PM   GFRAA 108 10/18/2020 02:34 PM    BMP Latest Ref Rng & Units 09/18/2021 02/15/2021 10/18/2020  Glucose 70 - 99  mg/dL 99 97 90  BUN 8 - 27 mg/dL 13 13 18   Creatinine 0.57 - 1.00 mg/dL 6.21 3.08  BUN/Creat Ratio 12 - 28 20 19 28   Sodium 134 - 144 mmol/L 143 142 143  Potassium 3.5 - 5.2 mmol/L 4.7 4.7 3.9  Chloride 96 - 106 mmol/L 104 102 106  CO2 20 - 29 mmol/L 25 25 25   Calcium 8.7 - 10.3 mg/dL 9.3 9.3 8.9     Current antihypertensive regimen:  Metoprolol 25 mg ER daily  Patient verbally confirms she is taking the above medications as directed. {yes/no:20286}  How often are you checking your Blood Pressure? {CHL HP BP Monitoring Frequency:[phone]}  she checks her blood pressure {timing:25218} {before/after:25217} taking her medication.  Current home BP readings: *** Wrist or arm cuff: Caffeine intake: Salt intake: OTC medications including pseudoephedrine or NSAIDs?  Any readings above 180/120? {yes/no:20286} If yes any symptoms of hypertensive emergency? {hypertensive emergency symptoms:25354}   What recent interventions/DTPs have been made by any provider to improve Blood Pressure control since last CPP Visit:  Educated on BP goals and benefits of medications for prevention of heart attack, stroke and kidney damage; -Counseled to monitor BP at home Daily, document, and provide log at future appointments.  Any recent hospitalizations or ED visits since last visit with CPP? No  What diet changes have been made to improve Blood Pressure Control?  ***  What exercise is being done to improve your Blood Pressure Control?  ***  Adherence Review: Is the patient currently on ACE/ARB medication? No Does the patient have >5 day gap between last estimated fill dates? CPP to review  01-03-2022: 1st attempt left VM  Care Gaps: Last annual wellness visit? 09-18-2021 Tdap overdue Shingrix overdue  Star Rating Drugs: Atorvastatin 10 mg- Last filled 11-20-2021 90 DS   [facility] CMA Clinical Pharmacist Assistant [phone]

## 2022-01-08 ENCOUNTER — Encounter: Payer: Self-pay | Admitting: Cardiology

## 2022-01-08 ENCOUNTER — Other Ambulatory Visit: Payer: Self-pay

## 2022-01-08 ENCOUNTER — Ambulatory Visit (INDEPENDENT_AMBULATORY_CARE_PROVIDER_SITE_OTHER): Payer: Medicare HMO | Admitting: Cardiology

## 2022-01-08 VITALS — BP 134/68 | HR 67 | Ht 60.0 in | Wt 165.6 lb

## 2022-01-08 DIAGNOSIS — I10 Essential (primary) hypertension: Secondary | ICD-10-CM | POA: Diagnosis not present

## 2022-01-08 DIAGNOSIS — I4729 Other ventricular tachycardia: Secondary | ICD-10-CM | POA: Diagnosis not present

## 2022-01-08 DIAGNOSIS — I471 Supraventricular tachycardia: Secondary | ICD-10-CM | POA: Diagnosis not present

## 2022-01-08 DIAGNOSIS — I350 Nonrheumatic aortic (valve) stenosis: Secondary | ICD-10-CM | POA: Diagnosis not present

## 2022-01-08 DIAGNOSIS — I4719 Other supraventricular tachycardia: Secondary | ICD-10-CM

## 2022-01-08 HISTORY — DX: Essential (primary) hypertension: I10

## 2022-01-08 HISTORY — DX: Nonrheumatic aortic (valve) stenosis: I35.0

## 2022-01-08 NOTE — Progress Notes (Signed)
?Cardiology Office Note:   ? ?Date:  01/08/2022  ? ?ID:  Brittany Short, DOB 07-21-1956, MRN [id number] ? ?PCP:  Marge Duncans, PA-C  ?Cardiologist:  Berniece Salines, DO  ?Electrophysiologist:  None  ? ?Referring MD: Marge Duncans, PA-C  ? ?" I am doing well" ? ?History of Present Illness:   ? ?Brittany Short is a 66 y.o. female with a hx of major depressive disorder, GERD presents today. ?  ? I saw the patient on 10/02/20 at that time. During that visit I started the patient on low dose beta blocker due to Toprol 12.5mg  . She also reported chest pain therefore a CCTA was ordered.  ?  ?I did see the patient on October 31, 2020 via virtual visit.  At that time she was experiencing improvement on the metoprolol with the palpitations.  She had no questions ? ?She was seen on July 11, 2021 at that time her blood pressure was slightly increased she also then had evidence of paroxysmal atrial tachycardia 916 ventricular tachycardia her monitor.  At that time I increased her metoprolol to 25 mg daily. ? ?She is experiencing ? ?Past Medical History:  ?Diagnosis Date  ? GERD (gastroesophageal reflux disease)   ? Major depressive disorder, single episode, moderate ([place])   ? Mild intermittent asthma with (acute) exacerbation   ? Nontoxic single thyroid nodule   ? ? ?Past Surgical History:  ?Procedure Laterality Date  ? [place]  ? X2  ? THYROIDECTOMY Right   ? ? ?Current Medications: ?Current Meds  ?Medication Sig  ? albuterol (PROVENTIL) (2.5 MG/3ML) 0.083% nebulizer solution Take 3 mLs (2.5 mg total) by nebulization every 6 (six) hours as needed for wheezing or shortness of breath.  ? albuterol (VENTOLIN HFA) 108 (90 Base) MCG/ACT inhaler Inhale 2 puffs into the lungs every 4 (four) hours as needed for wheezing or shortness of breath.  ? atorvastatin (LIPITOR) 10 MG tablet Take 1 tablet (10 mg total) by mouth daily.  ? benzonatate (TESSALON) 100 MG capsule Take 1 capsule (100 mg total) by mouth 3 (three) times  daily as needed for cough.  ? celecoxib (CELEBREX) 200 MG capsule Take 1 capsule (200 mg total) by mouth daily.  ? fluticasone (FLONASE) 50 MCG/ACT nasal spray Place 2 sprays into both nostrils daily.  ? metoprolol succinate (TOPROL-XL) 25 MG 24 hr tablet Take 1 tablet (25 mg total) by mouth daily.  ? montelukast (SINGULAIR) 10 MG tablet Take 1 tablet (10 mg total) by mouth daily.  ? pantoprazole (PROTONIX) 40 MG tablet TAKE 1 TABLET BY MOUTH ONCE DAILY.  ? venlafaxine XR (EFFEXOR-XR) 37.5 MG 24 hr capsule TAKE 1 CAPSULE BY MOUTH EVERY DAY  ? Vitamin D, Ergocalciferol, (DRISDOL) 1.25 MG (50000 UNIT) CAPS capsule Take 1 capsule (50,000 Units total) by mouth every 7 (seven) days.  ?  ? ?Allergies:   Patient has no known allergies.  ? ?Social History  ? ?Socioeconomic History  ? Marital status: Married  ?  Spouse name: Not on file  ? Number of children: 2  ? Years of education: Not on file  ? Highest education level: Not on file  ?Occupational History  ? Not on file  ?Tobacco Use  ? Smoking status: Never  ? Smokeless tobacco: Never  ?Vaping Use  ? Vaping Use: Never used  ?Substance and Sexual Activity  ? Alcohol use: Not Currently  ? Drug use: Never  ? Sexual activity: Not on file  ?Other Topics Concern  ?  Not on file  ?Social History Narrative  ? Not on file  ? ?Social Determinants of Health  ? ?Financial Resource Strain: Low Risk   ? Difficulty of Paying Living Expenses: Not hard at all  ?Food Insecurity: No Food Insecurity  ? Worried About Charity fundraiser in the Last Year: Never true  ? Ran Out of Food in the Last Year: Never true  ?Transportation Needs: No Transportation Needs  ? Lack of Transportation (Medical): No  ? Lack of Transportation (Non-Medical): No  ?Physical Activity: Insufficiently Active  ? Days of Exercise per Week: 3 days  ? Minutes of Exercise per Session: 30 min  ?Stress: No Stress Concern Present  ? Feeling of Stress : Not at all  ?Social Connections: Socially Integrated  ? Frequency of  Communication with Friends and Family: More than three times a week  ? Frequency of Social Gatherings with Friends and Family: Twice a week  ? Attends Religious Services: More than 4 times per year  ? Active Member of Clubs or Organizations: Yes  ? Attends Archivist Meetings: More than 4 times per year  ? Marital Status: Married  ?  ? ?Family History: ?The patient's family history includes COPD in her brother and mother; Depression in her sister; Heart attack in her father; Lung cancer in her brother and mother; Suicidality in her sister. There is no history of Breast cancer. ? ?ROS:   ?Review of Systems  ?Constitution: Negative for decreased appetite, fever and weight gain.  ?HENT: Negative for congestion, ear discharge, hoarse voice and sore throat.   ?Eyes: Negative for discharge, redness, vision loss in right eye and visual halos.  ?Cardiovascular: Negative for chest pain, dyspnea on exertion, leg swelling, orthopnea and palpitations.  ?Respiratory: Negative for cough, hemoptysis, shortness of breath and snoring.   ?Endocrine: Negative for heat intolerance and polyphagia.  ?Hematologic/Lymphatic: Negative for bleeding problem. Does not bruise/bleed easily.  ?Skin: Negative for flushing, nail changes, rash and suspicious lesions.  ?Musculoskeletal: Negative for arthritis, joint pain, muscle cramps, myalgias, neck pain and stiffness.  ?Gastrointestinal: Negative for abdominal pain, bowel incontinence, diarrhea and excessive appetite.  ?Genitourinary: Negative for decreased libido, genital sores and incomplete emptying.  ?Neurological: Negative for brief paralysis, focal weakness, headaches and loss of balance.  ?Psychiatric/Behavioral: Negative for altered mental status, depression and suicidal ideas.  ?Allergic/Immunologic: Negative for HIV exposure and persistent infections.  ? ? ?EKGs/Labs/Other Studies Reviewed:   ? ?The following studies were reviewed today: ? ? ?EKG:  The ekg ordered today  demonstrates sinus rhythm, heart rate 67 bpm compared to prior EKG no significant change. ? ?Transthoracic echocardiogram IMPRESSIONS  ? 1. Left ventricular ejection fraction, by estimation, is 60 to 65%. The  ?left ventricle has normal function. The left ventricle has no regional  ?wall motion abnormalities. There is mild concentric left ventricular  ?hypertrophy. Left ventricular diastolic  ?parameters were normal.  ? 2. Right ventricular systolic function is normal. The right ventricular  ?size is normal. There is normal pulmonary artery systolic pressure.  ? 3. The mitral valve is normal in structure. Mild mitral valve  ?regurgitation. No evidence of mitral stenosis.  ? 4. The aortic valve is tricuspid. Aortic valve regurgitation is not  ?visualized. No aortic stenosis is present.  ? 5. The inferior vena cava is normal in size with greater than 50%  ?respiratory variability, suggesting right atrial pressure of 3 mmHg.  ?  ?[place]  ?minimum heart rate was 55 bpm, maximum heart  rate was 171 bpm, and average heart rate was 78 bpm. ?Predominant underlying rhythm was Sinus Rhythm. ?  ?1 run of Ventricular Tachycardia of occurred lasting 7 beats with a maximum rate of 146 bpm (average 132 bpm).  ?  ?8 Supraventricular Tachycardia runs occurred, the run with the fastest interval lasting 5 beats with a maximum rate of 171 bpm, the longest lasting 11 beats with an averge rate of ?141 bpm. ?  ?Premature atrial complexes were rare (<1.0%). ?Premature Ventricular complexes were rare (<1.0%). ?  ?No pauses, No AV block and no atrial fibrillation present. ?21  patient triggered events: 10 associated ventricular ectopy, 1 associated with supraventricular tachycardia and the remaining associated with sinus rhythm. ?  ?Conclusion: This study is remarkable for the following: ?                                          1. Nonsustained ventricular tachycardia ?                                          2. Paroxysmal Supraventricular  tachycardia which is likely atrial tachycardia with variable block. ? ?Recent Labs: ?02/15/2021: TSH 2.130 ?09/18/2021: ALT 16; BUN 13; Creatinine, Ser 0.64; Hemoglobin 14.2; Platelets 212; Potassium 4.7; Sodium 143

## 2022-01-08 NOTE — Patient Instructions (Signed)

## 2022-02-20 ENCOUNTER — Ambulatory Visit (INDEPENDENT_AMBULATORY_CARE_PROVIDER_SITE_OTHER): Payer: Medicare HMO | Admitting: Physician Assistant

## 2022-02-20 ENCOUNTER — Encounter: Payer: Self-pay | Admitting: Physician Assistant

## 2022-02-20 VITALS — BP 128/82 | HR 63 | Temp 97.2°F | Ht 60.0 in | Wt 163.8 lb

## 2022-02-20 DIAGNOSIS — J301 Allergic rhinitis due to pollen: Secondary | ICD-10-CM | POA: Diagnosis not present

## 2022-02-20 DIAGNOSIS — R051 Acute cough: Secondary | ICD-10-CM | POA: Diagnosis not present

## 2022-02-20 MED ORDER — LORATADINE 10 MG PO TABS
10.0000 mg | ORAL_TABLET | Freq: Every day | ORAL | 11 refills | Status: DC
Start: 2022-02-20 — End: 2023-03-04

## 2022-02-20 MED ORDER — FLUTICASONE-SALMETEROL 100-50 MCG/ACT IN AEPB: 1.0000 | INHALATION_SPRAY | Freq: Two times a day (BID) | RESPIRATORY_TRACT | 2 refills | Status: DC

## 2022-02-20 NOTE — Progress Notes (Signed)
? ?Acute Office Visit ? ?Subjective:  ? ? [name], female    DOB: [birth year], 66 y.o.   MRN: [id number] ? ?Chief Complaint  ?Patient presents with  ? Cough  ? ? ?HPI: ?Patient is in today for complaints of a dry cough for the past 2 weeks - she states that she is taking singulair, benadryl and using nasal spray  ?She denies fever, productive cough or congestion ?At times she has slight wheeze noted with walking - she does have albuterol inhaler to use as needed ? ?Past Medical History:  ?Diagnosis Date  ? GERD (gastroesophageal reflux disease)   ? Major depressive disorder, single episode, moderate ([place])   ? Mild intermittent asthma with (acute) exacerbation   ? Nontoxic single thyroid nodule   ? ? ?Past Surgical History:  ?Procedure Laterality Date  ? [place]  ? X2  ? THYROIDECTOMY Right   ? ? ?Family History  ?Problem Relation Age of Onset  ? Lung cancer Mother   ? COPD Mother   ? Heart attack Father   ? Depression Sister   ? Suicidality Sister   ? Lung cancer Brother   ? COPD Brother   ? Breast cancer Neg Hx   ? ? ?Social History  ? ?Socioeconomic History  ? Marital status: Married  ?  Spouse name: Not on file  ? Number of children: 2  ? Years of education: Not on file  ? Highest education level: Not on file  ?Occupational History  ? Not on file  ?Tobacco Use  ? Smoking status: Never  ? Smokeless tobacco: Never  ?Vaping Use  ? Vaping Use: Never used  ?Substance and Sexual Activity  ? Alcohol use: Not Currently  ? Drug use: Never  ? Sexual activity: Not on file  ?Other Topics Concern  ? Not on file  ?Social History Narrative  ? Not on file  ? ?Social Determinants of Health  ? ?Financial Resource Strain: Low Risk   ? Difficulty of Paying Living Expenses: Not hard at all  ?Food Insecurity: No Food Insecurity  ? Worried About Charity fundraiser in the Last Year: Never true  ? Ran Out of Food in the Last Year: Never true  ?Transportation Needs: No Transportation Needs  ? Lack of  Transportation (Medical): No  ? Lack of Transportation (Non-Medical): No  ?Physical Activity: Insufficiently Active  ? Days of Exercise per Week: 3 days  ? Minutes of Exercise per Session: 30 min  ?Stress: No Stress Concern Present  ? Feeling of Stress : Not at all  ?Social Connections: Socially Integrated  ? Frequency of Communication with Friends and Family: More than three times a week  ? Frequency of Social Gatherings with Friends and Family: Twice a week  ? Attends Religious Services: More than 4 times per year  ? Active Member of Clubs or Organizations: Yes  ? Attends Archivist Meetings: More than 4 times per year  ? Marital Status: Married  ?Intimate Partner Violence: Not At Risk  ? Fear of Current or Ex-Partner: No  ? Emotionally Abused: No  ? Physically Abused: No  ? Sexually Abused: No  ? ? ?Outpatient Medications Prior to Visit  ?Medication Sig Dispense Refill  ? albuterol (PROVENTIL) (2.5 MG/3ML) 0.083% nebulizer solution Take 3 mLs (2.5 mg total) by nebulization every 6 (six) hours as needed for wheezing or shortness of breath. 75 mL 3  ? albuterol (VENTOLIN HFA) 108 (90  Base) MCG/ACT inhaler Inhale 2 puffs into the lungs every 4 (four) hours as needed for wheezing or shortness of breath. 18 g 2  ? atorvastatin (LIPITOR) 10 MG tablet Take 1 tablet (10 mg total) by mouth daily. 90 tablet 1  ? benzonatate (TESSALON) 100 MG capsule Take 1 capsule (100 mg total) by mouth 3 (three) times daily as needed for cough. 30 capsule 3  ? celecoxib (CELEBREX) 200 MG capsule Take 1 capsule (200 mg total) by mouth daily. 90 capsule 0  ? fluticasone (FLONASE) 50 MCG/ACT nasal spray Place 2 sprays into both nostrils daily. 16 g 3  ? metoprolol succinate (TOPROL-XL) 25 MG 24 hr tablet Take 1 tablet (25 mg total) by mouth daily. 90 tablet 3  ? montelukast (SINGULAIR) 10 MG tablet Take 1 tablet (10 mg total) by mouth daily. 90 tablet 1  ? pantoprazole (PROTONIX) 40 MG tablet TAKE 1 TABLET BY MOUTH ONCE DAILY. 90  tablet 1  ? venlafaxine XR (EFFEXOR-XR) 37.5 MG 24 hr capsule TAKE 1 CAPSULE BY MOUTH EVERY DAY 90 capsule 1  ? Vitamin D, Ergocalciferol, (DRISDOL) 1.25 MG (50000 UNIT) CAPS capsule Take 1 capsule (50,000 Units total) by mouth every 7 (seven) days. 12 capsule 1  ? ?No facility-administered medications prior to visit.  ? ? ?No Known Allergies ? ?Review of Systems ?CONSTITUTIONAL: Negative for chills, fatigue, fever, E/N/T: Negative for ear pain, nasal congestion and sore throat.  ?CARDIOVASCULAR: Negative for chest pain,   ?RESPIRATORY: see HPI ?GASTROINTESTINAL: Negative for abdominal pain, acid reflux symptoms, constipation, diarrhea, nausea and vomiting.  ? ?   ? ?   ?Objective:  ?  ?Physical Exam ?PHYSICAL EXAM:  ? ?VS: BP 128/82   Pulse 63   Temp (!) 97.2 ?F (36.2 ?C)   Ht 5' (1.524 m)   Wt 163 lb 12.8 oz (74.3 kg)   LMP  (LMP Unknown)   SpO2 98%   BMI 31.99 kg/m?  ? ?GEN: Well nourished, well developed, in no acute distress  ?HEENT: normal external ears and nose -  - Lips, Teeth and Gums - normal  ?Oropharynx - normal mucosa, palate, and posterior pharynx ? ?Cardiac: RRR; no murmurs, ?Respiratory:  normal respiratory rate and pattern with no distress - normal breath sounds with no rales, rhonchi, wheezes or rubs ? ? ?BP 128/82   Pulse 63   Temp (!) 97.2 ?F (36.2 ?C)   Ht 5' (1.524 m)   Wt 163 lb 12.8 oz (74.3 kg)   LMP  (LMP Unknown)   SpO2 98%   BMI 31.99 kg/m?  ?Wt Readings from Last 3 Encounters:  ?02/20/22 163 lb 12.8 oz (74.3 kg)  ?01/08/22 165 lb 9.6 oz (75.1 kg)  ?12/11/21 162 lb 9.6 oz (73.8 kg)  ? ? ?Health Maintenance Due  ?Topic Date Due  ? TETANUS/TDAP  Never done  ? Zoster Vaccines- Shingrix (1 of 2) Never done  ? ? ?There are no preventive care reminders to display for this patient. ? ? ?Lab Results  ?Component Value Date  ? TSH 2.130 02/15/2021  ? ?Lab Results  ?Component Value Date  ? WBC 6.3 09/18/2021  ? HGB 14.2 09/18/2021  ? HCT 43.0 09/18/2021  ? MCV 92 09/18/2021  ? PLT 212  09/18/2021  ? ?Lab Results  ?Component Value Date  ? NA 143 09/18/2021  ? K 4.7 09/18/2021  ? CO2 25 09/18/2021  ? GLUCOSE 99 09/18/2021  ? BUN 13 09/18/2021  ? CREATININE 0.64 09/18/2021  ?  BILITOT 0.3 09/18/2021  ? ALKPHOS 76 09/18/2021  ? AST 16 09/18/2021  ? ALT 16 09/18/2021  ? PROT 6.9 09/18/2021  ? ALBUMIN 4.5 09/18/2021  ? CALCIUM 9.3 09/18/2021  ? EGFR 97 09/18/2021  ? ?Lab Results  ?Component Value Date  ? CHOL 194 09/18/2021  ? ?Lab Results  ?Component Value Date  ? HDL 57 09/18/2021  ? ?Lab Results  ?Component Value Date  ? LDLCALC 126 (H) 09/18/2021  ? ?Lab Results  ?Component Value Date  ? TRIG 62 09/18/2021  ? ?Lab Results  ?Component Value Date  ? CHOLHDL 3.4 09/18/2021  ? ?No results found for: HGBA1C ? ?   ?Assessment & Plan:  ? ?Problem List Items Addressed This Visit   ?None ?Visit Diagnoses   ? ? Seasonal allergic rhinitis due to pollen    -  Primary  ? Relevant Medications  ? loratadine (CLARITIN) 10 MG tablet  ? fluticasone-salmeterol (ADVAIR) 100-50 MCG/ACT AEPB  ? Acute cough      ? Relevant Medications  ? loratadine (CLARITIN) 10 MG tablet  ? fluticasone-salmeterol (ADVAIR) 100-50 MCG/ACT AEPB  ? ?  ? ?Meds ordered this encounter  ?Medications  ? loratadine (CLARITIN) 10 MG tablet  ?  Sig: Take 1 tablet (10 mg total) by mouth daily.  ?  Dispense:  30 tablet  ?  Refill:  11  ?  Order Specific Question:   Supervising Provider  ?  AnswerRochel Brome [[id number]]  ? fluticasone-salmeterol (ADVAIR) 100-50 MCG/ACT AEPB  ?  Sig: Inhale 1 puff into the lungs 2 (two) times daily.  ?  Dispense:  1 each  ?  Refill:  2  ?  Order Specific Question:   Supervising Provider  ?  AnswerRochel Brome [[id number]]  ? ? ?No orders of the defined types were placed in this encounter. ?  ? ?Follow-up: Return if symptoms worsen or fail to improve. ? ?An After Visit Summary was printed and given to the patient. ? ?SARA R Yasmina [place], PA-C ?[place] ?[phone] ?

## 2022-03-08 ENCOUNTER — Encounter: Payer: Self-pay | Admitting: Family Medicine

## 2022-03-08 ENCOUNTER — Ambulatory Visit: Payer: Medicare HMO | Admitting: Family Medicine

## 2022-03-08 VITALS — BP 128/82 | Ht 60.0 in | Wt 163.0 lb

## 2022-03-08 DIAGNOSIS — Z0000 Encounter for general adult medical examination without abnormal findings: Secondary | ICD-10-CM | POA: Diagnosis not present

## 2022-03-08 NOTE — Patient Instructions (Signed)
Brittany Short , ?Thank you for taking time to come for your Medicare Wellness Visit. I appreciate your ongoing commitment to your health goals. Please review the following plan we discussed and let me know if I can assist you in the future.  ? ?Screening recommendations/referrals: ?Colonoscopy: Cologuard in 2022 repeat every 3 years ?Mammogram: 02/2022  ?Bone Density: Last complete 2021  ?Recommended yearly ophthalmology/optometry visit for glaucoma screening and checkup ?Recommended yearly dental visit for hygiene and checkup ? ?Vaccinations: ?Influenza vaccine: Fall 2023 ?Pneumococcal vaccine: Up to date ?Tdap vaccine: Unsure- needs to get at pharmacy  ?Shingles vaccine: Never had chicken pox; discussed in detail with MD- declined at this time. ? ?Advanced directives: Mail copy of from office ? ?Conditions/risks identified: Cholesterol; Blood pressure  ? ?Next appointment: 03/27/22 ? ? ?Preventive Care 21 Years and Older, Female ?Preventive care refers to lifestyle choices and visits with your health care provider that can promote health and wellness. ?What does preventive care include? ?A yearly physical exam. This is also called an annual well check. ?Dental exams once or twice a year. ?Routine eye exams. Ask your health care provider how often you should have your eyes checked. ?Personal lifestyle choices, including: ?Daily care of your teeth and gums. ?Regular physical activity. ?Eating a healthy diet. ?Avoiding tobacco and drug use. ?Limiting alcohol use. ?Practicing safe sex. ?Taking low-dose aspirin every day. ?Taking vitamin and mineral supplements as recommended by your health care provider. ?What happens during an annual well check? ?The services and screenings done by your health care provider during your annual well check will depend on your age, overall health, lifestyle risk factors, and family history of disease. ?Counseling  ?Your health care provider may ask you questions about your: ?Alcohol  use. ?Tobacco use. ?Drug use. ?Emotional well-being. ?Home and relationship well-being. ?Sexual activity. ?Eating habits. ?History of falls. ?Memory and ability to understand (cognition). ?Work and work Statistician. ?Reproductive health. ?Screening  ?You may have the following tests or measurements: ?Height, weight, and BMI. ?Blood pressure. ?Lipid and cholesterol levels. These may be checked every 5 years, or more frequently if you are over 70 years old. ?Skin check. ?Lung cancer screening. You may have this screening every year starting at age 67 if you have a 30-pack-year history of smoking and currently smoke or have quit within the past 15 years. ?Fecal occult blood test (FOBT) of the stool. You may have this test every year starting at age 74. ?Flexible sigmoidoscopy or colonoscopy. You may have a sigmoidoscopy every 5 years or a colonoscopy every 10 years starting at age 75. ?Hepatitis C blood test. ?Hepatitis B blood test. ?Sexually transmitted disease (STD) testing. ?Diabetes screening. This is done by checking your blood sugar (glucose) after you have not eaten for a while (fasting). You may have this done every 1-3 years. ?Bone density scan. This is done to screen for osteoporosis. You may have this done starting at age 69. ?Mammogram. This may be done every 1-2 years. Talk to your health care provider about how often you should have regular mammograms. ?Talk with your health care provider about your test results, treatment options, and if necessary, the need for more tests. ?Vaccines  ?Your health care provider may recommend certain vaccines, such as: ?Influenza vaccine. This is recommended every year. ?Tetanus, diphtheria, and acellular pertussis (Tdap, Td) vaccine. You may need a Td booster every 10 years. ?Zoster vaccine. You may need this after age 43. ?Pneumococcal 13-valent conjugate (PCV13) vaccine. One dose is recommended after  age 18. ?Pneumococcal polysaccharide (PPSV23) vaccine. One dose is  recommended after age 60. ?Talk to your health care provider about which screenings and vaccines you need and how often you need them. ?This information is not intended to replace advice given to you by your health care provider. Make sure you discuss any questions you have with your health care provider. ?Document Released: 11/10/2015 Document Revised: 07/03/2016 Document Reviewed: 08/15/2015 ?Elsevier Interactive Patient Education ? 2017 [place]. ? ?Fall Prevention in the Home ?Falls can cause injuries. They can happen to people of all ages. There are many things you can do to make your home safe and to help prevent falls. ?What can I do on the outside of my home? ?Regularly fix the edges of walkways and driveways and fix any cracks. ?Remove anything that might make you trip as you walk through a door, such as a raised step or threshold. ?Trim any bushes or trees on the path to your home. ?Use bright outdoor lighting. ?Clear any walking paths of anything that might make someone trip, such as rocks or tools. ?Regularly check to see if handrails are loose or broken. Make sure that both sides of any steps have handrails. ?Any raised decks and porches should have guardrails on the edges. ?Have any leaves, snow, or ice cleared regularly. ?Use sand or salt on walking paths during winter. ?Clean up any spills in your garage right away. This includes oil or grease spills. ?What can I do in the bathroom? ?Use night lights. ?Install grab bars by the toilet and in the tub and shower. Do not use towel bars as grab bars. ?Use non-skid mats or decals in the tub or shower. ?If you need to sit down in the shower, use a plastic, non-slip stool. ?Keep the floor dry. Clean up any water that spills on the floor as soon as it happens. ?Remove soap buildup in the tub or shower regularly. ?Attach bath mats securely with double-sided non-slip rug tape. ?Do not have throw rugs and other things on the floor that can make you  trip. ?What can I do in the bedroom? ?Use night lights. ?Make sure that you have a light by your bed that is easy to reach. ?Do not use any sheets or blankets that are too big for your bed. They should not hang down onto the floor. ?Have a firm chair that has side arms. You can use this for support while you get dressed. ?Do not have throw rugs and other things on the floor that can make you trip. ?What can I do in the kitchen? ?Clean up any spills right away. ?Avoid walking on wet floors. ?Keep items that you use a lot in easy-to-reach places. ?If you need to reach something above you, use a strong step stool that has a grab bar. ?Keep electrical cords out of the way. ?Do not use floor polish or wax that makes floors slippery. If you must use wax, use non-skid floor wax. ?Do not have throw rugs and other things on the floor that can make you trip. ?What can I do with my stairs? ?Do not leave any items on the stairs. ?Make sure that there are handrails on both sides of the stairs and use them. Fix handrails that are broken or loose. Make sure that handrails are as long as the stairways. ?Check any carpeting to make sure that it is firmly attached to the stairs. Fix any carpet that is loose or worn. ?Avoid having throw rugs  at the top or bottom of the stairs. If you do have throw rugs, attach them to the floor with carpet tape. ?Make sure that you have a light switch at the top of the stairs and the bottom of the stairs. If you do not have them, ask someone to add them for you. ?What else can I do to help prevent falls? ?Wear shoes that: ?Do not have high heels. ?Have rubber bottoms. ?Are comfortable and fit you well. ?Are closed at the toe. Do not wear sandals. ?If you use a stepladder: ?Make sure that it is fully opened. Do not climb a closed stepladder. ?Make sure that both sides of the stepladder are locked into place. ?Ask someone to hold it for you, if possible. ?Clearly mark and make sure that you can  see: ?Any grab bars or handrails. ?First and last steps. ?Where the edge of each step is. ?Use tools that help you move around (mobility aids) if they are needed. These include: ?Canes. ?Walkers. ?Scooters. ?Crutches. ?Turn on

## 2022-03-08 NOTE — Progress Notes (Signed)
? ?Annual Wellness Visit ? ?  ?I connected with  Brittany Short on 03/08/22 by a audio enabled telemedicine application and verified that I am speaking with the correct person using two identifiers. ? ?Patient Location: Home ? ?Provider Location: Home Office ? ?I discussed the limitations of evaluation and management by telemedicine. The patient expressed understanding and agreed to proceed. ? ? ?Patient: Brittany Short, Female    DOB: Aug 07, 1956, 66 y.o.   MRN: [id number] ? ?Subjective  ?No chief complaint on file. ? ? ?Brittany Short is a 66 y.o. female who presents today for her Annual Wellness Visit. ?She reports consuming a general diet. Home exercise routine includes walking .5 hrs per day. She generally feels well. She reports sleeping fairly well. She does not have additional problems to discuss today.  ? ? ? ?Vision:2021 and Dental: No current dental problems and No regular dental care  ?Wears dentures. ? ? ? ?Patient Active Problem List  ? Diagnosis Date Noted  ? Aortic valve stenosis 01/08/2022  ? Primary hypertension 01/08/2022  ? Traumatic hematoma of right lower leg 03/08/2021  ? Tremor 02/15/2021  ? Encounter for screening mammogram for breast cancer 02/15/2021  ? NSVT (nonsustained ventricular tachycardia) ([place]) 10/02/2020  ? PAT (paroxysmal atrial tachycardia) ([place]) 10/02/2020  ? Chest pain of uncertain etiology 42/35/3614  ? Morbid obesity ([place]) 10/02/2020  ? Colon cancer screening 08/02/2020  ? Need for prophylactic vaccination against Streptococcus pneumoniae (pneumococcus) 08/02/2020  ? Need for prophylactic vaccination and inoculation against influenza 08/02/2020  ? Palpitations 08/02/2020  ? UTI (urinary tract infection) 03/06/2020  ? Nontoxic single thyroid nodule   ? Mild intermittent asthma with (acute) exacerbation   ? GERD (gastroesophageal reflux disease)   ? Major depressive disorder, single episode, moderate ([place])   ? Chronic frontal sinusitis 01/04/2020  ? ?Past Medical History:   ?Diagnosis Date  ? GERD (gastroesophageal reflux disease)   ? Major depressive disorder, single episode, moderate ([place])   ? Mild intermittent asthma with (acute) exacerbation   ? Nontoxic single thyroid nodule   ? ?Past Surgical History:  ?Procedure Laterality Date  ? [place]  ? X2  ? THYROIDECTOMY Right   ? ?Social History  ? ?Tobacco Use  ? Smoking status: Never  ? Smokeless tobacco: Never  ?Vaping Use  ? Vaping Use: Never used  ?Substance Use Topics  ? Alcohol use: Not Currently  ? Drug use: Never  ? ?No Known Allergies ?  ? ?Medications: ?Outpatient Medications Prior to Visit  ?Medication Sig  ? albuterol (PROVENTIL) (2.5 MG/3ML) 0.083% nebulizer solution Take 3 mLs (2.5 mg total) by nebulization every 6 (six) hours as needed for wheezing or shortness of breath.  ? albuterol (VENTOLIN HFA) 108 (90 Base) MCG/ACT inhaler Inhale 2 puffs into the lungs every 4 (four) hours as needed for wheezing or shortness of breath.  ? atorvastatin (LIPITOR) 10 MG tablet Take 1 tablet (10 mg total) by mouth daily.  ? benzonatate (TESSALON) 100 MG capsule Take 1 capsule (100 mg total) by mouth 3 (three) times daily as needed for cough.  ? celecoxib (CELEBREX) 200 MG capsule Take 1 capsule (200 mg total) by mouth daily.  ? fluticasone (FLONASE) 50 MCG/ACT nasal spray Place 2 sprays into both nostrils daily.  ? fluticasone-salmeterol (ADVAIR) 100-50 MCG/ACT AEPB Inhale 1 puff into the lungs 2 (two) times daily.  ? loratadine (CLARITIN) 10 MG tablet Take 1 tablet (10 mg total) by mouth daily.  ? metoprolol succinate (  TOPROL-XL) 25 MG 24 hr tablet Take 1 tablet (25 mg total) by mouth daily.  ? montelukast (SINGULAIR) 10 MG tablet Take 1 tablet (10 mg total) by mouth daily.  ? pantoprazole (PROTONIX) 40 MG tablet TAKE 1 TABLET BY MOUTH ONCE DAILY.  ? venlafaxine XR (EFFEXOR-XR) 37.5 MG 24 hr capsule TAKE 1 CAPSULE BY MOUTH EVERY DAY  ? Vitamin D, Ergocalciferol, (DRISDOL) 1.25 MG (50000 UNIT) CAPS capsule Take 1  capsule (50,000 Units total) by mouth every 7 (seven) days.  ? ?No facility-administered medications prior to visit.  ?  ?No Known Allergies ? ?Patient Care Team: ?Eliot Ford as PCP - General (Physician Assistant) ?Berniece Salines, DO as PCP - Cardiology (Cardiology) ?Burnice Logan, Leroy (Inactive) as Pharmacist (Pharmacist) ?[facility], [facility] ? ?  ? ?Objective  ?LMP  (LMP Unknown)  ?BP Readings from Last 3 Encounters:  ?02/20/22 128/82  ?01/08/22 134/68  ?12/11/21 134/80  ? ?Wt Readings from Last 3 Encounters:  ?02/20/22 163 lb 12.8 oz (74.3 kg)  ?01/08/22 165 lb 9.6 oz (75.1 kg)  ?12/11/21 162 lb 9.6 oz (73.8 kg)  ? ? ?Most recent functional status assessment: ? ?  03/08/2022  ?  7:40 AM  ?In your present state of health, do you have any difficulty performing the following activities:  ?Hearing? 0  ?Vision? 0  ?Difficulty concentrating or making decisions? 0  ?Walking or climbing stairs? 0  ?Dressing or bathing? 0  ?Doing errands, shopping? 0  ?Preparing Food and eating ? N  ?Using the Toilet? N  ?In the past six months, have you accidently leaked urine? N  ?Do you have problems with loss of bowel control? N  ?Managing your Medications? N  ?Managing your Finances? N  ?Housekeeping or managing your Housekeeping? N  ? ?Most recent fall risk assessment: ? ?  03/08/2022  ?  7:40 AM  ?Fall Risk   ?Falls in the past year? 1  ?Number falls in past yr: 0  ?Injury with Fall? 1  ?Follow up Education provided  ? ? Most recent depression screenings: ? ?  03/08/2022  ?  8:12 AM 02/20/2022  ?  1:46 PM  ?PHQ 2/9 Scores  ?PHQ - 2 Score 0 0  ? ?Most recent cognitive screening: ? ?  03/08/2022  ?  8:13 AM  ?6CIT Screen  ?What Year? 0 points  ?What month? 0 points  ?What time? 0 points  ?Count back from 20 0 points  ?Months in reverse 0 points  ?Repeat phrase 0 points  ?Total Score 0 points  ? ?Most recent Audit-C alcohol use screening ? ?  03/08/2022  ?  7:40 AM  ?Alcohol Use Disorder Test (AUDIT)  ?1. How often do you have a  drink containing alcohol? 0  ?3. How often do you have six or more drinks on one occasion? 0  ? ?A score of 3 or more in women, and 4 or more in men indicates increased risk for alcohol abuse, EXCEPT if all of the points are from question 1  ? ?Vision/Hearing Screen: ?No results found. ? ?Last CBC ?Lab Results  ?Component Value Date  ? WBC 6.3 09/18/2021  ? HGB 14.2 09/18/2021  ? HCT 43.0 09/18/2021  ? MCV 92 09/18/2021  ? MCH 30.3 09/18/2021  ? RDW 12.6 09/18/2021  ? PLT 212 09/18/2021  ? ?Last metabolic panel ?Lab Results  ?Component Value Date  ? GLUCOSE 99 09/18/2021  ? NA 143 09/18/2021  ? K 4.7 09/18/2021  ?  CL 104 09/18/2021  ? CO2 25 09/18/2021  ? BUN 13 09/18/2021  ? CREATININE 0.64 09/18/2021  ? EGFR 97 09/18/2021  ? CALCIUM 9.3 09/18/2021  ? PROT 6.9 09/18/2021  ? ALBUMIN 4.5 09/18/2021  ? LABGLOB 2.4 09/18/2021  ? AGRATIO 1.9 09/18/2021  ? BILITOT 0.3 09/18/2021  ? ALKPHOS 76 09/18/2021  ? AST 16 09/18/2021  ? ALT 16 09/18/2021  ? ?Last lipids ?Lab Results  ?Component Value Date  ? CHOL 194 09/18/2021  ? HDL 57 09/18/2021  ? LDLCALC 126 (H) 09/18/2021  ? TRIG 62 09/18/2021  ? CHOLHDL 3.4 09/18/2021  ? ?Last thyroid functions ?Lab Results  ?Component Value Date  ? TSH 2.130 02/15/2021  ? T4TOTAL 8.2 08/02/2020  ? ?Last vitamin D ?Lab Results  ?Component Value Date  ? VD25OH 66.8 09/18/2021  ? ?  ? ?No results found for any visits on 03/08/22. ?  ? ?Assessment & Plan  ? ?Annual wellness visit done today including the all of the following: ?Reviewed patient's Family Medical History ?Reviewed and updated list of patient's medical providers ?Assessment of cognitive impairment was done ?Assessed patient's functional ability ?Established a written schedule for health screening services ?Health Risk Assessent Completed and Reviewed ? ?Exercise Activities and Dietary recommendations ? Goals   ? ?  Manage My Medicine   ?  Timeframe:  Long-Range Goal ?Priority:  High ?Start Date:                              ?Expected End Date:                      ? ?Follow Up Date 08/09/22 ?  ?- keep a list of all the medicines I take; vitamins and herbals too ?- learn to read medicine labels  ?  ?Why is this important?   ?These steps

## 2022-03-18 ENCOUNTER — Ambulatory Visit: Payer: Medicare HMO | Admitting: Physician Assistant

## 2022-03-27 ENCOUNTER — Ambulatory Visit (INDEPENDENT_AMBULATORY_CARE_PROVIDER_SITE_OTHER): Payer: Medicare HMO | Admitting: Physician Assistant

## 2022-03-27 ENCOUNTER — Encounter: Payer: Self-pay | Admitting: Physician Assistant

## 2022-03-27 VITALS — BP 136/78 | HR 66 | Temp 97.1°F | Ht 60.0 in | Wt 163.0 lb

## 2022-03-27 DIAGNOSIS — N958 Other specified menopausal and perimenopausal disorders: Secondary | ICD-10-CM

## 2022-03-27 DIAGNOSIS — I10 Essential (primary) hypertension: Secondary | ICD-10-CM

## 2022-03-27 DIAGNOSIS — I4729 Other ventricular tachycardia: Secondary | ICD-10-CM

## 2022-03-27 DIAGNOSIS — Z0184 Encounter for antibody response examination: Secondary | ICD-10-CM

## 2022-03-27 DIAGNOSIS — E559 Vitamin D deficiency, unspecified: Secondary | ICD-10-CM | POA: Diagnosis not present

## 2022-03-27 DIAGNOSIS — K2100 Gastro-esophageal reflux disease with esophagitis, without bleeding: Secondary | ICD-10-CM | POA: Diagnosis not present

## 2022-03-27 DIAGNOSIS — F321 Major depressive disorder, single episode, moderate: Secondary | ICD-10-CM

## 2022-03-27 DIAGNOSIS — J4520 Mild intermittent asthma, uncomplicated: Secondary | ICD-10-CM | POA: Diagnosis not present

## 2022-03-27 DIAGNOSIS — Z1231 Encounter for screening mammogram for malignant neoplasm of breast: Secondary | ICD-10-CM | POA: Diagnosis not present

## 2022-03-27 MED ORDER — VENLAFAXINE HCL ER 37.5 MG PO CP24: ORAL_CAPSULE | ORAL | 1 refills | Status: DC

## 2022-03-27 MED ORDER — CELECOXIB 200 MG PO CAPS: 200.0000 mg | ORAL_CAPSULE | Freq: Every day | ORAL | 1 refills | Status: DC

## 2022-03-27 NOTE — Progress Notes (Signed)
 Established Patient Office Visit  Subjective:  [name], female    DOB: 08/12/1956  Age: 66 y.o. MRN: [id number]  CC:  Chief Complaint  Patient presents with   Hyperlipidemia    HPI Keyondra Short presents for chronic follow up  Pt is now seeing cardiology and diagnosed with PAT - currently on toprol XL 25mg 1qd and that is controlling symptoms well -   Pt with history of anxiety - symptoms are stable on effexor xr 37.5mg and voices no concerns or problems- requests refill of medication   Pt with history of GERD - symptoms stable on protonix 40mg qd -   Pt with history of arthritis - symptoms stable on celebrex 200mg qd- she states that she intermittently takes this medication because of her history of GERD  Pt with history of vit D def - is taking weekly supplement - due to repeat labwork  Pt is due for dexa scan and mammogram - would like to get those scheduled  Pt is due for zoster vaccine but would like to know her immune status to varicella before proceeding (states she does not think she ever had chicken pox)  Past Medical History:  Diagnosis Date   GERD (gastroesophageal reflux disease)    Major depressive disorder, single episode, moderate (HCC)    Mild intermittent asthma with (acute) exacerbation    Nontoxic single thyroid nodule     Past Surgical History:  Procedure Laterality Date   CESAREAN SECTION  1982 & 1998   X2   THYROIDECTOMY Right     Family History  Problem Relation Age of Onset   Lung cancer Mother    COPD Mother    Heart attack Father    Depression Sister    Suicidality Sister    Lung cancer Brother    COPD Brother    Breast cancer Neg Hx     Social History   Socioeconomic History   Marital status: Married    Spouse name: Not on file   Number of children: 2   Years of education: Not on file   Highest education level: Not on file  Occupational History   Not on file  Tobacco Use   Smoking status: Never    Smokeless tobacco: Never  Vaping Use   Vaping Use: Never used  Substance and Sexual Activity   Alcohol use: Not Currently   Drug use: Never   Sexual activity: Not on file  Other Topics Concern   Not on file  Social History Narrative   Not on file   Social Determinants of Health   Financial Resource Strain: Low Risk    Difficulty of Paying Living Expenses: Not hard at all  Food Insecurity: No Food Insecurity   Worried About Running Out of Food in the Last Year: Never true   Ran Out of Food in the Last Year: Never true  Transportation Needs: No Transportation Needs   Lack of Transportation (Medical): No   Lack of Transportation (Non-Medical): No  Physical Activity: Insufficiently Active   Days of Exercise per Week: 3 days   Minutes of Exercise per Session: 30 min  Stress: No Stress Concern Present   Feeling of Stress : Not at all  Social Connections: Socially Integrated   Frequency of Communication with Friends and Family: More than three times a week   Frequency of Social Gatherings with Friends and Family: Twice a week   Attends Religious Services: More than 4 times per year     Active Member of Clubs or Organizations: Yes   Attends Club or Organization Meetings: More than 4 times per year   Marital Status: Married  Intimate Partner Violence: Not At Risk   Fear of Current or Ex-Partner: No   Emotionally Abused: No   Physically Abused: No   Sexually Abused: No     Current Outpatient Medications:    albuterol (PROVENTIL) (2.5 MG/3ML) 0.083% nebulizer solution, Take 3 mLs (2.5 mg total) by nebulization every 6 (six) hours as needed for wheezing or shortness of breath., Disp: 75 mL, Rfl: 3   albuterol (VENTOLIN HFA) 108 (90 Base) MCG/ACT inhaler, Inhale 2 puffs into the lungs every 4 (four) hours as needed for wheezing or shortness of breath., Disp: 18 g, Rfl: 2   atorvastatin (LIPITOR) 10 MG tablet, Take 1 tablet (10 mg total) by mouth daily., Disp: 90 tablet, Rfl: 1    fluticasone (FLONASE) 50 MCG/ACT nasal spray, Place 2 sprays into both nostrils daily., Disp: 16 g, Rfl: 3   fluticasone-salmeterol (ADVAIR) 100-50 MCG/ACT AEPB, Inhale 1 puff into the lungs 2 (two) times daily., Disp: 1 each, Rfl: 2   loratadine (CLARITIN) 10 MG tablet, Take 1 tablet (10 mg total) by mouth daily., Disp: 30 tablet, Rfl: 11   metoprolol succinate (TOPROL-XL) 25 MG 24 hr tablet, Take 1 tablet (25 mg total) by mouth daily., Disp: 90 tablet, Rfl: 3   montelukast (SINGULAIR) 10 MG tablet, Take 1 tablet (10 mg total) by mouth daily., Disp: 90 tablet, Rfl: 1   pantoprazole (PROTONIX) 40 MG tablet, TAKE 1 TABLET BY MOUTH ONCE DAILY., Disp: 90 tablet, Rfl: 1   Vitamin D, Ergocalciferol, (DRISDOL) 1.25 MG (50000 UNIT) CAPS capsule, Take 1 capsule (50,000 Units total) by mouth every 7 (seven) days., Disp: 12 capsule, Rfl: 1   celecoxib (CELEBREX) 200 MG capsule, Take 1 capsule (200 mg total) by mouth daily., Disp: 90 capsule, Rfl: 1   venlafaxine XR (EFFEXOR-XR) 37.5 MG 24 hr capsule, TAKE 1 CAPSULE BY MOUTH EVERY DAY, Disp: 90 capsule, Rfl: 1   No Known Allergies CONSTITUTIONAL: Negative for chills, fatigue, fever, unintentional weight gain and unintentional weight loss.  CARDIOVASCULAR: Negative for chest pain, dizziness, palpitations and pedal edema.  RESPIRATORY: Negative for recent cough and dyspnea.  GASTROINTESTINAL: Negative for abdominal pain, acid reflux symptoms, constipation, diarrhea, nausea and vomiting.  MSK: Negative for arthralgias and myalgias.  PSYCHIATRIC: Negative for sleep disturbance and to question depression screen.  Negative for depression, negative for anhedonia.       Objective:  PHYSICAL EXAM:   VS: BP 136/78 (BP Location: Left Arm, Patient Position: Sitting)   Pulse 66   Temp (!) 97.1 F (36.2 C) (Temporal)   Ht 5' (1.524 m)   Wt 163 lb (73.9 kg)   LMP  (LMP Unknown)   SpO2 99%   BMI 31.83 kg/m   GEN: Well nourished, well developed, in no acute  distress   Cardiac: RRR; no murmurs, rubs, or gallops,no edema -  Respiratory:  normal respiratory rate and pattern with no distress - normal breath sounds with no rales, rhonchi, wheezes or rubs GI: normal bowel sounds, no masses or tenderness MS: no deformity or atrophy  Skin: warm and dry, no rash  Psych: euthymic mood, appropriate affect and demeanor   Health Maintenance Due  Topic Date Due   TETANUS/TDAP  Never done   Zoster Vaccines- Shingrix (1 of 2) Never done   DEXA SCAN  Never done   MAMMOGRAM  03/19/2022      There are no preventive care reminders to display for this patient.  Lab Results  Component Value Date   TSH 2.130 02/15/2021   Lab Results  Component Value Date   WBC 6.3 09/18/2021   HGB 14.2 09/18/2021   HCT 43.0 09/18/2021   MCV 92 09/18/2021   PLT 212 09/18/2021   Lab Results  Component Value Date   NA 143 09/18/2021   K 4.7 09/18/2021   CO2 25 09/18/2021   GLUCOSE 99 09/18/2021   BUN 13 09/18/2021   CREATININE 0.64 09/18/2021   BILITOT 0.3 09/18/2021   ALKPHOS 76 09/18/2021   AST 16 09/18/2021   ALT 16 09/18/2021   PROT 6.9 09/18/2021   ALBUMIN 4.5 09/18/2021   CALCIUM 9.3 09/18/2021   EGFR 97 09/18/2021   Lab Results  Component Value Date   CHOL 194 09/18/2021   Lab Results  Component Value Date   HDL 57 09/18/2021   Lab Results  Component Value Date   LDLCALC 126 (H) 09/18/2021   Lab Results  Component Value Date   TRIG 62 09/18/2021   Lab Results  Component Value Date   CHOLHDL 3.4 09/18/2021   No results found for: HGBA1C    Assessment & Plan:   Problem List Items Addressed This Visit       Cardiovascular and Mediastinum   NSVT (nonsustained ventricular tachycardia) (HCC) - Primary   Relevant Orders   CBC with Differential/Platelet   Comprehensive metabolic panel   Lipid panel Continue current med and follow up with cardiology as directed Continue meds     Digestive   GERD (gastroesophageal reflux disease)    Relevant Medications   pantoprazole (PROTONIX) 40 MG tablet     Endocrine   Nontoxic single thyroid nodule   Relevant Orders   TSH     Other   Major depressive disorder, single episode, moderate (HCC)   Relevant Medications   venlafaxine XR (EFFEXOR-XR) 37.5 MG 24 hr capsule             Vitamin D insufficiency       Relevant Orders   VITAMIN D 25 Hydroxy (Vit-D Deficiency, Fractures)       Meds ordered this encounter  Medications   celecoxib (CELEBREX) 200 MG capsule    Sig: Take 1 capsule (200 mg total) by mouth daily.    Dispense:  90 capsule    Refill:  1    Order Specific Question:   Supervising Provider    Answer:   COX, KIRSTEN [[id number]]   venlafaxine XR (EFFEXOR-XR) 37.5 MG 24 hr capsule    Sig: TAKE 1 CAPSULE BY MOUTH EVERY DAY    Dispense:  90 capsule    Refill:  1    Order Specific Question:   Supervising Provider    Answer:   COX, KIRSTEN [[id number]]   Follow-up: Return in about 6 months (around 09/26/2022) for chronic fasting follow up.    SARA R DAVIS, PA-C 

## 2022-03-28 LAB — COMPREHENSIVE METABOLIC PANEL
ALT: 19 IU/L (ref 0–32)
AST: 18 IU/L (ref 0–40)
Albumin/Globulin Ratio: 1.9 (ref 1.2–2.2)
Albumin: 4.3 g/dL (ref 3.8–4.8)
Alkaline Phosphatase: 97 IU/L (ref 44–121)
BUN/Creatinine Ratio: 14 (ref 12–28)
BUN: 9 mg/dL (ref 8–27)
Bilirubin Total: 0.4 mg/dL (ref 0.0–1.2)
CO2: 26 mmol/L (ref 20–29)
Calcium: 9.1 mg/dL (ref 8.7–10.3)
Chloride: 106 mmol/L (ref 96–106)
Creatinine, Ser: 0.64 mg/dL (ref 0.57–1.00)
Globulin, Total: 2.3 g/dL (ref 1.5–4.5)
Glucose: 100 mg/dL — ABNORMAL HIGH (ref 70–99)
Potassium: 4.8 mmol/L (ref 3.5–5.2)
Sodium: 144 mmol/L (ref 134–144)
Total Protein: 6.6 g/dL (ref 6.0–8.5)
eGFR: 97 mL/min/{1.73_m2} (ref >59)

## 2022-03-28 LAB — VARICELLA ZOSTER ANTIBODY, IGG: Varicella zoster IgG: 247 index (ref >165)

## 2022-03-28 LAB — CBC WITH DIFFERENTIAL/PLATELET
Basophils Absolute: 0 10*3/uL (ref 0.0–0.2)
Basos: 0 %
EOS (ABSOLUTE): 0.5 10*3/uL — ABNORMAL HIGH (ref 0.0–0.4)
Eos: 8 %
Hematocrit: 40 % (ref 34.0–46.6)
Hemoglobin: 13.3 g/dL (ref 11.1–15.9)
Immature Grans (Abs): 0 10*3/uL (ref 0.0–0.1)
Immature Granulocytes: 0 %
Lymphocytes Absolute: 2.7 10*3/uL (ref 0.7–3.1)
Lymphs: 41 %
MCH: 30.4 pg (ref 26.6–33.0)
MCHC: 33.3 g/dL (ref 31.5–35.7)
MCV: 92 fL (ref 79–97)
Monocytes Absolute: 0.4 10*3/uL (ref 0.1–0.9)
Monocytes: 5 %
Neutrophils Absolute: 3 10*3/uL (ref 1.4–7.0)
Neutrophils: 46 %
Platelets: 223 10*3/uL (ref 150–450)
RBC: 4.37 x10E6/uL (ref 3.77–5.28)
RDW: 12.2 % (ref 11.7–15.4)
WBC: 6.6 10*3/uL (ref 3.4–10.8)

## 2022-03-28 LAB — LIPID PANEL
Chol/HDL Ratio: 2.9 ratio (ref 0.0–4.4)
Cholesterol, Total: 149 mg/dL (ref 100–199)
HDL: 52 mg/dL (ref >39)
LDL Chol Calc (NIH): 78 mg/dL (ref 0–99)
Triglycerides: 101 mg/dL (ref 0–149)
VLDL Cholesterol Cal: 19 mg/dL (ref 5–40)

## 2022-03-28 LAB — VITAMIN D 25 HYDROXY (VIT D DEFICIENCY, FRACTURES): Vit D, 25-Hydroxy: 67.6 ng/mL (ref 30.0–100.0)

## 2022-03-28 LAB — TSH: TSH: 2.15 u[IU]/mL (ref 0.450–4.500)

## 2022-03-28 LAB — CARDIOVASCULAR RISK ASSESSMENT

## 2022-04-04 ENCOUNTER — Encounter: Payer: Self-pay | Admitting: Physician Assistant

## 2022-04-06 IMAGING — MG DIGITAL SCREENING BILAT W/ CAD
5 series · 5 of 5 positions shown · non-contrast
Comparison: Previous exam(s).

CLINICAL DATA: Screening.

EXAM:
DIGITAL SCREENING BILATERAL MAMMOGRAM WITH CAD
TECHNIQUE: Bilateral screening digital craniocaudal and mediolateral oblique
mammograms were obtained. The images were evaluated with
computer-aided detection.

[R CC (1 of 2)]
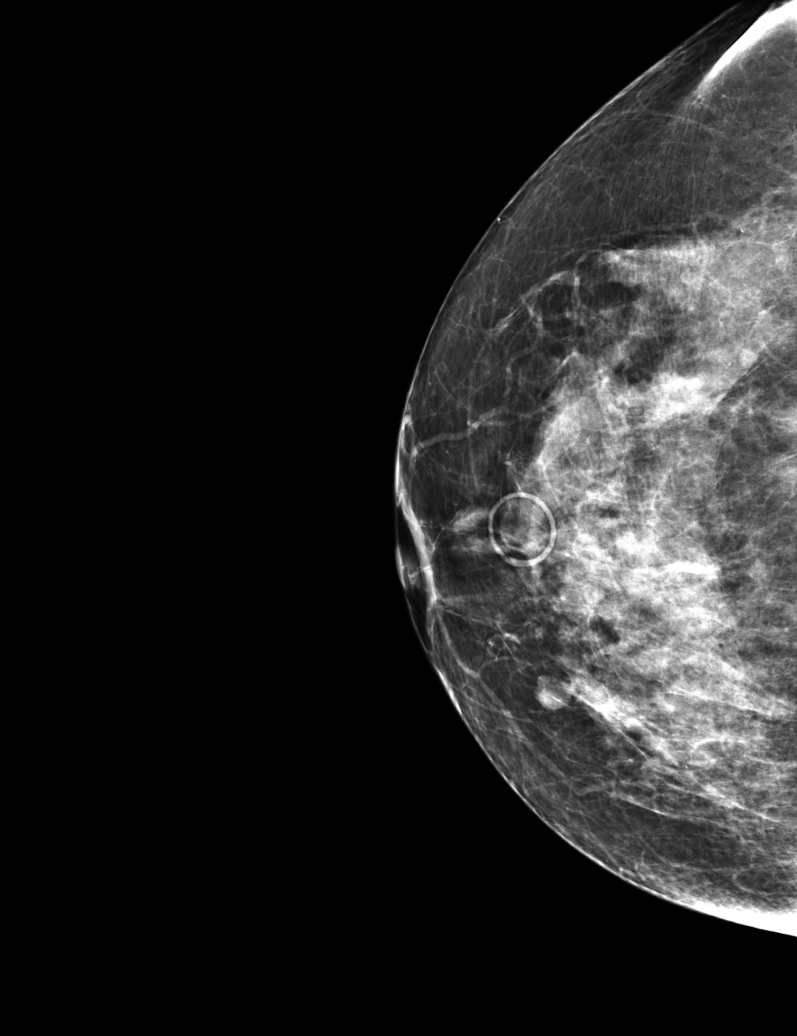

[L CC]
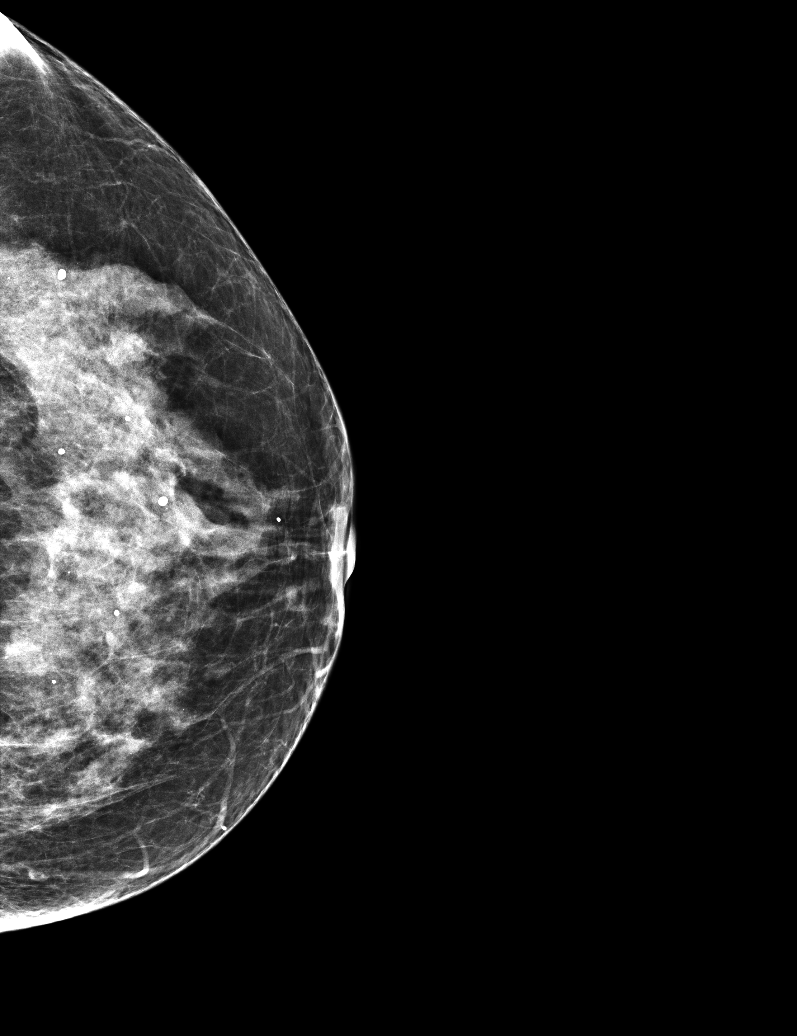

[R MLO]
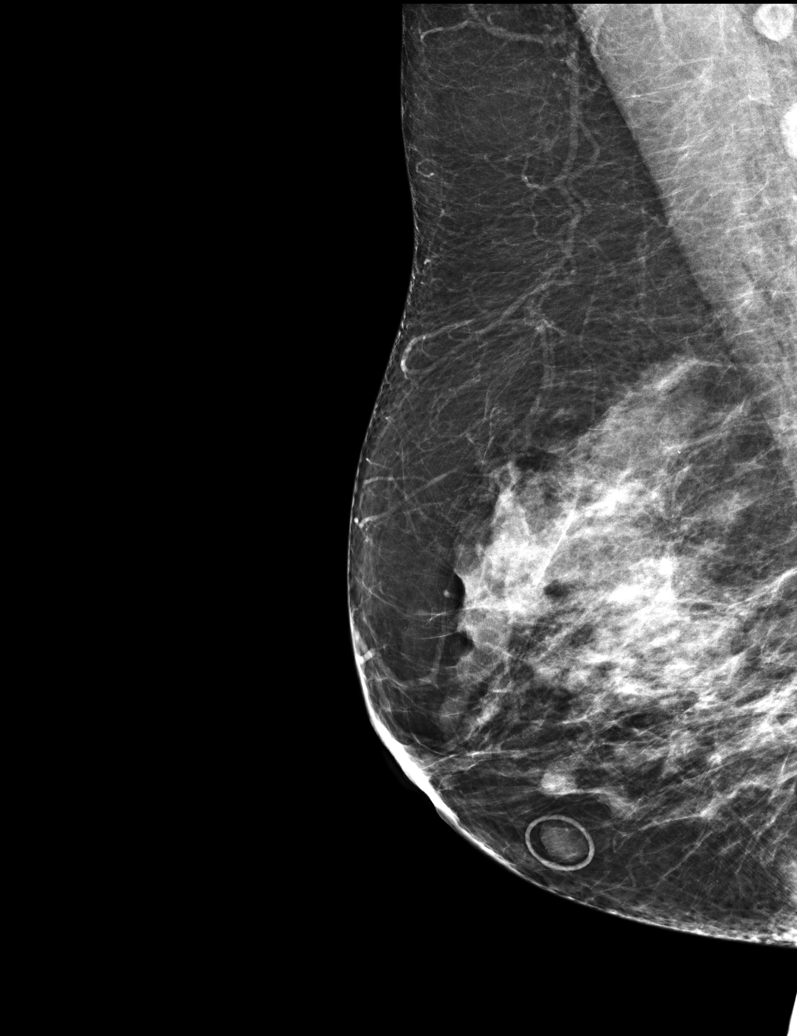

[L MLO]
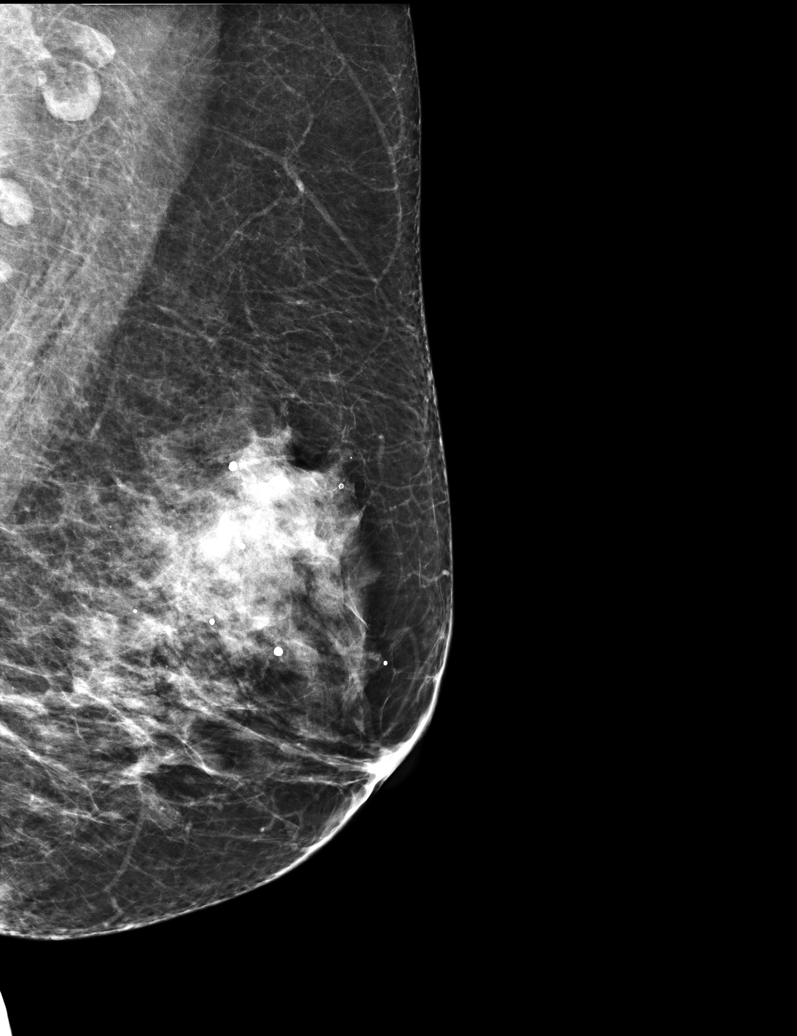

[R CC (2 of 2)]
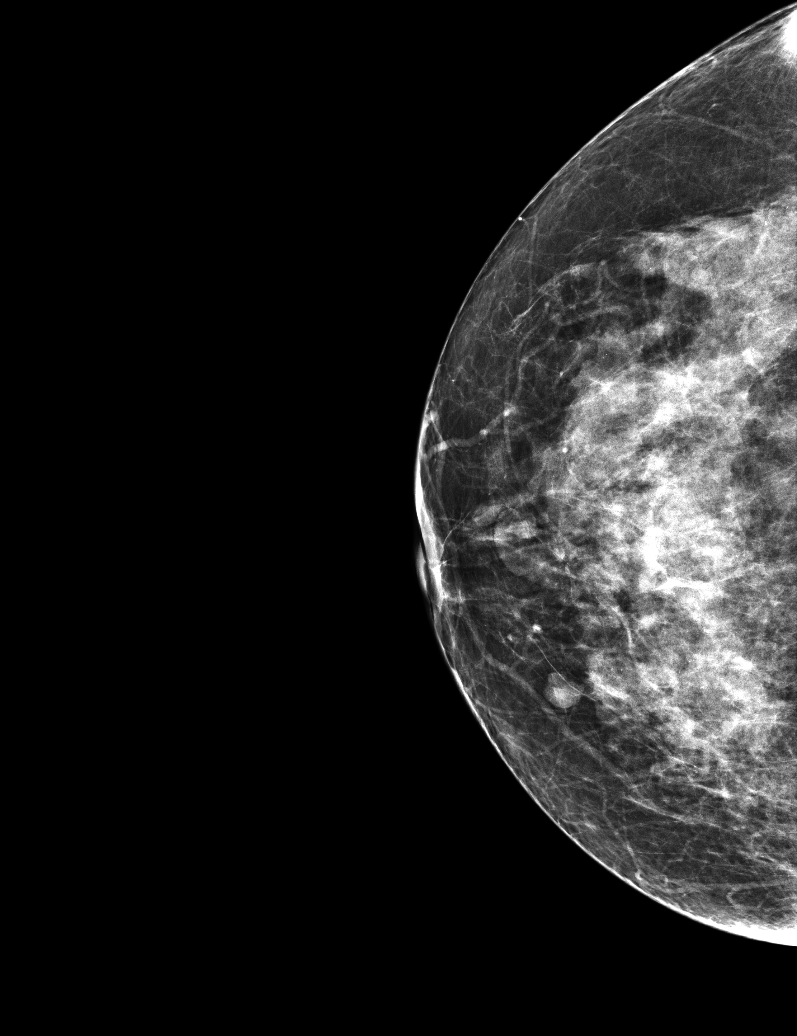

[5 of 5 positions shown; findings below may reference images not displayed]

ACR Breast Density Category c: The breast tissue is heterogeneously
dense, which may obscure small masses.
FINDINGS: In the left breast, a possible mass warrants further evaluation.
This possible mass is seen within the outer LEFT breast, middle to
posterior depth, cc view only.

In the right breast, no findings suspicious for malignancy.
IMPRESSION: Further evaluation is suggested for a possible mass in the left
breast.

RECOMMENDATION:
Diagnostic mammogram and possibly ultrasound of the left breast.
(Code:[id number]-[id number])

The patient will be contacted regarding the findings, and additional
imaging will be scheduled.

BI-RADS CATEGORY  0: Incomplete. Need additional imaging evaluation
and/or prior mammograms for comparison.

## 2022-04-08 ENCOUNTER — Other Ambulatory Visit: Payer: Self-pay | Admitting: Physician Assistant

## 2022-04-08 DIAGNOSIS — E559 Vitamin D deficiency, unspecified: Secondary | ICD-10-CM

## 2022-04-16 ENCOUNTER — Telehealth: Payer: Self-pay

## 2022-04-16 NOTE — Chronic Care Management (AMB) (Unsigned)
Chronic Care Management Pharmacy Assistant   Name: Brittany Short  MRN: [id number] DOB: 01-18-1956  Reason for Encounter: Disease State/ Hypertension  Recent office visits:  03-27-2022 Marianne Sofia, PA-C. DG bone density and MM digital screening bilateral ordered. EOS (ABSOLUTE)= 0.5. Glucose= 100. FINISHED tessalon.  03-08-2022 Freddy Finner, NP. Medicare annual wellness visit.  02-20-2022 Marianne Sofia, PA-C. Visit for seasonal allergic rhinitis due to pollen. START Claritin 10 mg daily and advair 1 puff twice daily.  Recent consult visits:  01-08-2022 Thomasene Ripple, DO (Cardiology). EKG completed. FINISHED nitrofurantoin and promethazine.  Hospital visits:  None in previous 6 months  Medications: Outpatient Encounter Medications as of 04/16/2022  Medication Sig   albuterol (PROVENTIL) (2.5 MG/3ML) 0.083% nebulizer solution Take 3 mLs (2.5 mg total) by nebulization every 6 (six) hours as needed for wheezing or shortness of breath.   albuterol (VENTOLIN HFA) 108 (90 Base) MCG/ACT inhaler Inhale 2 puffs into the lungs every 4 (four) hours as needed for wheezing or shortness of breath.   atorvastatin (LIPITOR) 10 MG tablet Take 1 tablet (10 mg total) by mouth daily.   celecoxib (CELEBREX) 200 MG capsule Take 1 capsule (200 mg total) by mouth daily.   fluticasone (FLONASE) 50 MCG/ACT nasal spray Place 2 sprays into both nostrils daily.   fluticasone-salmeterol (ADVAIR) 100-50 MCG/ACT AEPB Inhale 1 puff into the lungs 2 (two) times daily.   loratadine (CLARITIN) 10 MG tablet Take 1 tablet (10 mg total) by mouth daily.   metoprolol succinate (TOPROL-XL) 25 MG 24 hr tablet Take 1 tablet (25 mg total) by mouth daily.   montelukast (SINGULAIR) 10 MG tablet Take 1 tablet (10 mg total) by mouth daily.   pantoprazole (PROTONIX) 40 MG tablet TAKE 1 TABLET BY MOUTH ONCE DAILY.   venlafaxine XR (EFFEXOR-XR) 37.5 MG 24 hr capsule TAKE 1 CAPSULE BY MOUTH EVERY DAY   Vitamin D, Ergocalciferol,  (DRISDOL) 1.25 MG (50000 UNIT) CAPS capsule TAKE 1 CAPSULE BY MOUTH EVERY SEVEN DAYS   No facility-administered encounter medications on file as of 04/16/2022.   Recent Office Vitals: BP Readings from Last 3 Encounters:  03/27/22 136/78  03/08/22 128/82  02/20/22 128/82   Pulse Readings from Last 3 Encounters:  03/27/22 66  02/20/22 63  01/08/22 67    Wt Readings from Last 3 Encounters:  03/27/22 163 lb (73.9 kg)  03/08/22 163 lb (73.9 kg)  02/20/22 163 lb 12.8 oz (74.3 kg)     Kidney Function Lab Results  Component Value Date/Time   CREATININE 0.64 03/27/2022 11:16 AM   CREATININE 0.64 09/18/2021 10:19 AM   GFRNONAA 94 10/18/2020 02:34 PM   GFRAA 108 10/18/2020 02:34 PM       Latest Ref Rng & Units 03/27/2022   11:16 AM 09/18/2021   10:19 AM 02/15/2021   10:07 AM  BMP  Glucose 70 - 99 mg/dL 740  99  97   BUN 8 - 27 mg/dL 9  13  13    Creatinine 0.57 - 1.00 mg/dL  8.14  4.81   BUN/Creat Ratio 12 - 28 14  20  19    Sodium 134 - 144 mmol/L 144  143  142   Potassium 3.5 - 5.2 mmol/L 4.8  4.7  4.7   Chloride 96 - 106 mmol/L 106  104  102   CO2 20 - 29 mmol/L 26  25  25    Calcium 8.7 - 10.3 mg/dL 9.1  9.3  9.3  Current antihypertensive regimen:  Metoprolol 25 mg ER daily  Patient verbally confirms she is taking the above medications as directed. {yes/no:20286}  How often are you checking your Blood Pressure? {CHL HP BP Monitoring [phone]}  she checks her blood pressure {timing:25218} {before/after:25217} taking her medication.  Current home BP readings: *** Wrist or arm cuff: Caffeine intake: Salt intake: OTC medications including pseudoephedrine or NSAIDs?  Any readings above 180/120? {yes/no:20286} If yes any symptoms of hypertensive emergency? {hypertensive emergency symptoms:25354}   What recent interventions/DTPs have been made by any provider to improve Blood Pressure control since last CPP Visit:  Educated on BP goals and benefits  of medications for prevention of heart attack, stroke and kidney damage; -Counseled to monitor BP at home Daily, document, and provide log at future appointments.  Any recent hospitalizations or ED visits since last visit with CPP? No  What diet changes have been made to improve Blood Pressure Control?  ***  What exercise is being done to improve your Blood Pressure Control?  ***  Adherence Review: Is the patient currently on ACE/ARB medication? No Does the patient have >5 day gap between last estimated fill dates? CPP to review  04-16-2022: 1st attempt left VM 04-18-2022: 2nd attempt left VM  Care Gaps: Last annual wellness visit? 03-08-2022   Star Rating Drugs: Atorvastatin 10 mg- Last filled 03-01-2022 90 DS  Malecca Wilson [facility] CMA Clinical Pharmacist Assistant [phone]

## 2022-04-17 ENCOUNTER — Other Ambulatory Visit: Payer: Self-pay | Admitting: Physician Assistant

## 2022-04-17 ENCOUNTER — Other Ambulatory Visit: Payer: Self-pay | Admitting: Family Medicine

## 2022-04-17 DIAGNOSIS — J060 Acute laryngopharyngitis: Secondary | ICD-10-CM

## 2022-05-10 ENCOUNTER — Telehealth: Payer: Medicare HMO

## 2022-05-24 DIAGNOSIS — Z1231 Encounter for screening mammogram for malignant neoplasm of breast: Secondary | ICD-10-CM | POA: Diagnosis not present

## 2022-05-24 DIAGNOSIS — N959 Unspecified menopausal and perimenopausal disorder: Secondary | ICD-10-CM | POA: Diagnosis not present

## 2022-05-24 DIAGNOSIS — M85851 Other specified disorders of bone density and structure, right thigh: Secondary | ICD-10-CM | POA: Diagnosis not present

## 2022-05-27 ENCOUNTER — Other Ambulatory Visit: Payer: Self-pay

## 2022-05-27 DIAGNOSIS — Z1231 Encounter for screening mammogram for malignant neoplasm of breast: Secondary | ICD-10-CM

## 2022-05-27 DIAGNOSIS — N958 Other specified menopausal and perimenopausal disorders: Secondary | ICD-10-CM

## 2022-06-11 ENCOUNTER — Other Ambulatory Visit: Payer: Self-pay | Admitting: Physician Assistant

## 2022-06-11 DIAGNOSIS — K219 Gastro-esophageal reflux disease without esophagitis: Secondary | ICD-10-CM

## 2022-06-25 ENCOUNTER — Telehealth: Payer: Medicare HMO

## 2022-06-25 ENCOUNTER — Telehealth: Payer: Self-pay

## 2022-06-25 NOTE — Telephone Encounter (Signed)
  Care Management   Follow Up Note   06/25/2022 Name: Hetal Proano MRN: [id number] DOB: [birth year]   Referred by: Marianne Sofia, PA-C Reason for referral : Chronic Care Management   An unsuccessful telephone outreach was attempted today. The patient was referred to the case management team for assistance with care management and care coordination.   Follow Up Plan: The patient has been provided with contact information for the care management team and has been advised to call with any health related questions or concerns.   Artelia Laroche, Pharm.D. - [phone]

## 2022-07-09 ENCOUNTER — Telehealth: Payer: Self-pay

## 2022-07-09 ENCOUNTER — Encounter: Payer: Self-pay | Admitting: Nurse Practitioner

## 2022-07-09 ENCOUNTER — Ambulatory Visit (INDEPENDENT_AMBULATORY_CARE_PROVIDER_SITE_OTHER): Payer: Medicare HMO | Admitting: Nurse Practitioner

## 2022-07-09 VITALS — BP 136/68 | HR 71 | Temp 97.7°F | Ht 60.0 in | Wt 166.0 lb

## 2022-07-09 DIAGNOSIS — J301 Allergic rhinitis due to pollen: Secondary | ICD-10-CM | POA: Diagnosis not present

## 2022-07-09 DIAGNOSIS — J4521 Mild intermittent asthma with (acute) exacerbation: Secondary | ICD-10-CM | POA: Diagnosis not present

## 2022-07-09 DIAGNOSIS — J069 Acute upper respiratory infection, unspecified: Secondary | ICD-10-CM

## 2022-07-09 MED ORDER — TRELEGY ELLIPTA 100-62.5-25 MCG/ACT IN AEPB: 1.0000 | INHALATION_SPRAY | Freq: Every day | RESPIRATORY_TRACT | 0 refills | Status: DC

## 2022-07-09 MED ORDER — AZITHROMYCIN 250 MG PO TABS: ORAL_TABLET | ORAL | 0 refills | Status: AC

## 2022-07-09 MED ORDER — PROMETHAZINE-DM 6.25-15 MG/5ML PO SYRP: 5.0000 mL | ORAL_SOLUTION | Freq: Four times a day (QID) | ORAL | 0 refills | Status: DC | PRN

## 2022-07-09 NOTE — Progress Notes (Signed)
   Acute Office Visit  Subjective:     [name], female    DOB: [birth year], 66 y.o.   MRN: [id number]  Chief Complaint  Patient presents with   Cough        HPI: Patient is in today for acute cough for four days. Denies fever, chills, or body aches, Denies dyspnea, orthopnea, or pedal edema.  Treatment has included Robitussin with honey, Albuterol inhaler, Coricidin. Pt has a history of asthma since childhood and seasonal allergic rhinitis. Currently prescribed Singulair, Flonase, and Claritin daily that she is adherent to daily. Pt has had 3 negative COVID-19 home tests. Denies known exposure to ill contacts.   Review of Systems  Respiratory:  Positive for cough.         Objective:    BP 136/68 (BP Location: Left Arm, Patient Position: Sitting)   Pulse 71   Temp 97.7 F (36.5 C) (Temporal)   Wt 166 lb (75.3 kg)   LMP  (LMP Unknown)   BMI 32.42 kg/m    Physical Exam Vitals reviewed.  Constitutional:      Appearance: Normal appearance.  HENT:     Right Ear: Tympanic membrane normal.     Left Ear: Tympanic membrane normal.     Nose: No congestion or rhinorrhea.     Mouth/Throat:     Mouth: Mucous membranes are moist.     Pharynx: Posterior oropharyngeal erythema present.  Eyes:     Pupils: Pupils are equal, round, and reactive to light.  Cardiovascular:     Rate and Rhythm: Normal rate and regular rhythm.     Pulses: Normal pulses.  Pulmonary:     Effort: Pulmonary effort is normal.     Breath sounds: Wheezing (wheezes to bilateral posterior upper lobes) present.  Skin:    General: Skin is warm and dry.     Capillary Refill: Capillary refill takes less than 2 seconds.  Neurological:     General: No focal deficit present.     Mental Status: She is alert and oriented to person, place, and time.        Assessment & Plan:   1. Mild intermittent asthma with acute exacerbation - Fluticasone-Umeclidin-Vilant (TRELEGY ELLIPTA) 100-62.5-25  MCG/ACT AEPB; Inhale 1 puff into the lungs daily.  Dispense: 1 each; Refill: 0 - azithromycin (ZITHROMAX) 250 MG tablet; Take 2 tablets on day 1, then 1 tablet daily on days 2 through 5  Dispense: 6 tablet; Refill: 0  2. Seasonal allergic rhinitis due to pollen -continue Claritin, Flonase, and Singulair -avoid allergy triggers as much as possible  3. Upper respiratory tract infection, unspecified type - promethazine-dextromethorphan (PROMETHAZINE-DM) 6.25-15 MG/5ML syrup; Take 5 mLs by mouth 4 (four) times daily as needed.  Dispense: 118 mL; Refill: 0  Begin Trelegy inhaler once daily, rinse mouth afterwards Use Albuterol inhaler as needed for shortness of breath and wheezing Take Z-pack as prescribed Rest and push fluids Take Mucinex every 12 hours for symptoms    Follow-up: PRN  I, Janie Morning, NP, have reviewed all documentation for this visit. The documentation on 07/09/22 for the exam, diagnosis, procedures, and orders are all accurate and complete.    Signed Flonnie Hailstone, DNP 07/08/22 at 1:10 pm       Janie Morning, NP

## 2022-07-09 NOTE — Telephone Encounter (Signed)
Patient left a message on the [facility] a few mins before 8 am stating that she has a ongoing cough, has taken 3 covid test that was negative.  LM for patient to call the office back to make an appointment.

## 2022-07-09 NOTE — Progress Notes (Deleted)
Acute Office Visit  Subjective:    [name], female    DOB: Mar 05, 1956, 66 y.o.   MRN: [id number]  Chief Complaint  Patient presents with   Cough    Since saturday    Cough Pertinent negatives include no chest pain, ear pain, fever, headaches, myalgias, rash, sore throat, shortness of breath or wheezing.   Patient is in today for ***  Past Medical History:  Diagnosis Date   GERD (gastroesophageal reflux disease)    Major depressive disorder, single episode, moderate (HCC)    Mild intermittent asthma with (acute) exacerbation    Nontoxic single thyroid nodule     Past Surgical History:  Procedure Laterality Date   [place]   X2   THYROIDECTOMY Right     Family History  Problem Relation Age of Onset   Lung cancer Mother    COPD Mother    Heart attack Father    Depression Sister    Suicidality Sister    Lung cancer Brother    COPD Brother    Breast cancer Neg Hx     Social History   Socioeconomic History   Marital status: Married    Spouse name: Not on file   Number of children: 2   Years of education: Not on file   Highest education level: Not on file  Occupational History   Not on file  Tobacco Use   Smoking status: Never   Smokeless tobacco: Never  Vaping Use   Vaping Use: Never used  Substance and Sexual Activity   Alcohol use: Not Currently   Drug use: Never   Sexual activity: Not on file  Other Topics Concern   Not on file  Social History Narrative   Not on file   Social Determinants of Health   Financial Resource Strain: Low Risk  (09/18/2021)   Overall Financial Resource Strain (CARDIA)    Difficulty of Paying Living Expenses: Not hard at all  Food Insecurity: No Food Insecurity (09/18/2021)   Hunger Vital Sign    Worried About Running Out of Food in the Last Year: Never true    Ran Out of Food in the Last Year: Never true  Transportation Needs: No Transportation Needs (09/18/2021)   PRAPARE -  Hydrologist (Medical): No    Lack of Transportation (Non-Medical): No  Physical Activity: Insufficiently Active (09/18/2021)   Exercise Vital Sign    Days of Exercise per Week: 3 days    Minutes of Exercise per Session: 30 min  Stress: No Stress Concern Present (09/18/2021)   [place]    Feeling of Stress : Not at all  Social Connections: Hope (09/18/2021)   Social Connection and Isolation Panel [NHANES]    Frequency of Communication with Friends and Family: More than three times a week    Frequency of Social Gatherings with Friends and Family: Twice a week    Attends Religious Services: More than 4 times per year    Active Member of [employer] or Organizations: Yes    Attends Archivist Meetings: More than 4 times per year    Marital Status: Married  Human resources officer Violence: Not At Risk (09/18/2021)   Humiliation, Afraid, Rape, and Kick questionnaire    Fear of Current or Ex-Partner: No    Emotionally Abused: No    Physically Abused: No    Sexually Abused: No  Outpatient Medications Prior to Visit  Medication Sig Dispense Refill   albuterol (PROVENTIL) (2.5 MG/3ML) 0.083% nebulizer solution Take 3 mLs (2.5 mg total) by nebulization every 6 (six) hours as needed for wheezing or shortness of breath. 75 mL 3   albuterol (VENTOLIN HFA) 108 (90 Base) MCG/ACT inhaler Inhale 2 puffs into the lungs every 4 (four) hours as needed for wheezing or shortness of breath. 18 g 2   atorvastatin (LIPITOR) 10 MG tablet TAKE 1 TABLET BY MOUTH DAILY 90 tablet 1   celecoxib (CELEBREX) 200 MG capsule Take 1 capsule (200 mg total) by mouth daily. 90 capsule 1   fluticasone (FLONASE) 50 MCG/ACT nasal spray Place 2 sprays into both nostrils daily. 16 g 3   fluticasone-salmeterol (ADVAIR) 100-50 MCG/ACT AEPB Inhale 1 puff into the lungs 2 (two) times daily. 1 each 2   loratadine  (CLARITIN) 10 MG tablet Take 1 tablet (10 mg total) by mouth daily. 30 tablet 11   metoprolol succinate (TOPROL-XL) 25 MG 24 hr tablet Take 1 tablet (25 mg total) by mouth daily. 90 tablet 3   montelukast (SINGULAIR) 10 MG tablet TAKE ONE TABLET BY MOUTH EVERY DAY 90 tablet 1   pantoprazole (PROTONIX) 40 MG tablet TAKE ONE TABLET BY MOUTH EVERY DAY 90 tablet 1   venlafaxine XR (EFFEXOR-XR) 37.5 MG 24 hr capsule TAKE 1 CAPSULE BY MOUTH EVERY DAY 90 capsule 1   Vitamin D, Ergocalciferol, (DRISDOL) 1.25 MG (50000 UNIT) CAPS capsule TAKE 1 CAPSULE BY MOUTH EVERY SEVEN DAYS 12 capsule 1   No facility-administered medications prior to visit.    No Known Allergies  Review of Systems  Constitutional:  Negative for appetite change, fatigue and fever.  HENT:  Negative for congestion, ear pain, sinus pressure and sore throat.   Respiratory:  Positive for cough. Negative for chest tightness, shortness of breath and wheezing.   Cardiovascular:  Negative for chest pain and palpitations.  Gastrointestinal:  Negative for abdominal pain, constipation, diarrhea, nausea and vomiting.  Genitourinary:  Negative for dysuria and hematuria.  Musculoskeletal:  Negative for arthralgias, back pain, joint swelling and myalgias.  Skin:  Negative for rash.  Neurological:  Negative for dizziness, weakness and headaches.  Psychiatric/Behavioral:  Negative for dysphoric mood. The patient is not nervous/anxious.        Objective:    Physical Exam Vitals reviewed.  Constitutional:      Appearance: Normal appearance. She is normal weight.  Cardiovascular:     Rate and Rhythm: Normal rate and regular rhythm.     Heart sounds: Normal heart sounds.  Pulmonary:     Effort: Pulmonary effort is normal.     Breath sounds: Normal breath sounds.  Abdominal:     General: Abdomen is flat. Bowel sounds are normal.     Palpations: Abdomen is soft.  Neurological:     Mental Status: She is alert and oriented to person,  place, and time.  Psychiatric:        Mood and Affect: Mood normal.        Behavior: Behavior normal.     BP 136/68 (BP Location: Left Arm, Patient Position: Sitting)   Pulse 71   Temp 97.7 F (36.5 C) (Temporal)   Ht 5' (1.524 m)   Wt 166 lb (75.3 kg)   LMP  (LMP Unknown)   SpO2 96%   BMI 32.42 kg/m  Wt Readings from Last 3 Encounters:  07/09/22 166 lb (75.3 kg)  03/27/22 163 lb (73.9 kg)  03/08/22 163 lb (73.9 kg)    Health Maintenance Due  Topic Date Due   TETANUS/TDAP  Never done   Zoster Vaccines- Shingrix (1 of 2) Never done   INFLUENZA VACCINE  05/28/2022    There are no preventive care reminders to display for this patient.   Lab Results  Component Value Date   TSH 2.150 03/27/2022   Lab Results  Component Value Date   WBC 6.6 03/27/2022   HGB 13.3 03/27/2022   HCT 40.0 03/27/2022   MCV 92 03/27/2022   PLT 223 03/27/2022   Lab Results  Component Value Date   NA 144 03/27/2022   K 4.8 03/27/2022   CO2 26 03/27/2022   GLUCOSE 100 (H) 03/27/2022   BUN 9 03/27/2022   CREATININE 0.64 03/27/2022   BILITOT 0.4 03/27/2022   ALKPHOS 97 03/27/2022   AST 18 03/27/2022   ALT 19 03/27/2022   PROT 6.6 03/27/2022   ALBUMIN 4.3 03/27/2022   CALCIUM 9.1 03/27/2022   EGFR 97 03/27/2022   Lab Results  Component Value Date   CHOL 149 03/27/2022   Lab Results  Component Value Date   HDL 52 03/27/2022   Lab Results  Component Value Date   LDLCALC 78 03/27/2022   Lab Results  Component Value Date   TRIG 101 03/27/2022   Lab Results  Component Value Date   CHOLHDL 2.9 03/27/2022   No results found for: "HGBA1C"       Assessment & Plan:   1. Mild intermittent asthma with acute exacerbation ***  2. Seasonal allergic rhinitis due to pollen ***    No orders of the defined types were placed in this encounter.   I,Orrie Schubert M Sherilynn Dieu,acting as a Education administrator for [employer], NP.,have documented all relevant documentation on the behalf of Rip Harbour, NP,as directed by  Rip Harbour, NP while in the presence of Rip Harbour, NP.    Oleta Mouse, CMA

## 2022-07-09 NOTE — Patient Instructions (Signed)
Begin Trelegy inhaler once daily, rinse mouth afterwards Use Albuterol inhaler as needed for shortness of breath and wheezing Take Z-pack as prescribed Rest and push fluids Take Mucinex every 12 hours for symptoms  Upper Respiratory Infection, Adult An upper respiratory infection (URI) affects the nose, throat, and upper airways that lead to the lungs. The most common type of URI is often called the common cold. URIs usually get better on their own, without medical treatment. What are the causes? A URI is caused by a germ (virus). You may catch these germs by: Breathing in droplets from an infected person's cough or sneeze. Touching something that has the germ on it (is contaminated) and then touching your mouth, nose, or eyes. What increases the risk? You are more likely to get a URI if: You are very young or very old. You have close contact with others, such as at work, school, or a health care facility. You smoke. You have long-term (chronic) heart or lung disease. You have a weakened disease-fighting system (immune system). You have nasal allergies or asthma. You have a lot of stress. You have poor nutrition. What are the signs or symptoms? Runny or stuffy (congested) nose. Cough. Sneezing. Sore throat. Headache. Feeling tired (fatigue). Fever. Not wanting to eat as much as usual. Pain in your forehead, behind your eyes, and over your cheekbones (sinus pain). Muscle aches. Redness or irritation of the eyes. Pressure in the ears or face. How is this treated? URIs usually get better on their own within 7-10 days. Medicines cannot cure URIs, but your doctor may recommend certain medicines to help relieve symptoms, such as: Over-the-counter cold medicines. Medicines to reduce coughing (cough suppressants). Coughing is a type of defense against infection that helps to clear the nose, throat, windpipe, and lungs (respiratory system). Take these medicines only as told by your  doctor. Medicines to lower your fever. Follow these instructions at home: Activity Rest as needed. If you have a fever, stay home from work or school until your fever is gone, or until your doctor says you may return to work or school. You should stay home until you cannot spread the infection anymore (you are not contagious). Your doctor may have you wear a face mask so you have less risk of spreading the infection. Relieving symptoms Rinse your mouth often with salt water. To make salt water, dissolve -1 tsp (3-6 g) of salt in 1 cup (237 mL) of warm water. Use a cool-mist humidifier to add moisture to the air. This can help you breathe more easily. Eating and drinking  Drink enough fluid to keep your pee (urine) pale yellow. Eat soups and other clear broths. General instructions  Take over-the-counter and prescription medicines only as told by your doctor. Do not smoke or use any products that contain nicotine or tobacco. If you need help quitting, ask your doctor. Avoid being where people are smoking (avoid secondhand smoke). Stay up to date on all your shots (immunizations), and get the flu shot every year. Keep all follow-up visits. How to prevent the spread of infection to others  Wash your hands with soap and water for at least 20 seconds. If you cannot use soap and water, use hand sanitizer. Avoid touching your mouth, face, eyes, or nose. Cough or sneeze into a tissue or your sleeve or elbow. Do not cough or sneeze into your hand or into the air. Contact a doctor if: You are getting worse, not better. You have any of these:  A fever or chills. Brown or red mucus in your nose. Yellow or brown fluid (discharge)coming from your nose. Pain in your face, especially when you bend forward. Swollen neck glands. Pain when you swallow. White areas in the back of your throat. Get help right away if: You have shortness of breath that gets worse. You have very bad or  constant: Headache. Ear pain. Pain in your forehead, behind your eyes, and over your cheekbones (sinus pain). Chest pain. You have long-lasting (chronic) lung disease along with any of these: Making high-pitched whistling sounds when you breathe, most often when you breathe out (wheezing). Long-lasting cough (more than 14 days). Coughing up blood. A change in your usual mucus. You have a stiff neck. You have changes in your: Vision. Hearing. Thinking. Mood. These symptoms may be an emergency. Get help right away. Call 911. Do not wait to see if the symptoms will go away. Do not drive yourself to the hospital. Summary An upper respiratory infection (URI) is caused by a germ (virus). The most common type of URI is often called the common cold. URIs usually get better within 7-10 days. Take over-the-counter and prescription medicines only as told by your doctor. This information is not intended to replace advice given to you by your health care provider. Make sure you discuss any questions you have with your health care provider. Document Revised: 05/16/2021 Document Reviewed: 05/16/2021 Elsevier Patient Education  2023 [employer].

## 2022-07-11 ENCOUNTER — Encounter: Payer: Self-pay | Admitting: Physician Assistant

## 2022-07-11 ENCOUNTER — Telehealth: Payer: Self-pay | Admitting: Nurse Practitioner

## 2022-07-11 ENCOUNTER — Encounter: Payer: Self-pay | Admitting: Nurse Practitioner

## 2022-07-11 DIAGNOSIS — R051 Acute cough: Secondary | ICD-10-CM

## 2022-07-11 MED ORDER — GUAIFENESIN-CODEINE 100-10 MG/5ML PO SYRP: 5.0000 mL | ORAL_SOLUTION | Freq: Three times a day (TID) | ORAL | 0 refills | Status: DC | PRN

## 2022-07-11 NOTE — Telephone Encounter (Signed)
Telephoned pt concerning cough. Pt has been prescribed Promethazine-DM, given Mucinex samples, a course of Azithromycing, and Trelegy inhaler for URI. Pt states she is taking Tessalon Perles she had leftover from a previous prescription. Continues to cough, interrupting sleep. Pt requested "cough syrup with Codeine". Prescription sent for Robitussin-AC to pharmacy. Encouraged pt to use caution when changing positions due to risk of falling. Pt verbalized understanding.

## 2022-07-12 ENCOUNTER — Other Ambulatory Visit: Payer: Self-pay | Admitting: Cardiology

## 2022-07-12 ENCOUNTER — Other Ambulatory Visit: Payer: Self-pay | Admitting: Physician Assistant

## 2022-07-12 MED ORDER — ALBUTEROL SULFATE (2.5 MG/3ML) 0.083% IN NEBU: 2.5000 mg | INHALATION_SOLUTION | Freq: Four times a day (QID) | RESPIRATORY_TRACT | 3 refills | Status: DC | PRN

## 2022-07-12 MED ORDER — PREDNISONE 20 MG PO TABS: ORAL_TABLET | ORAL | 0 refills | Status: AC

## 2022-07-15 ENCOUNTER — Encounter: Payer: Self-pay | Admitting: Physician Assistant

## 2022-07-15 ENCOUNTER — Ambulatory Visit (INDEPENDENT_AMBULATORY_CARE_PROVIDER_SITE_OTHER): Payer: Medicare HMO | Admitting: Physician Assistant

## 2022-07-15 VITALS — BP 130/76 | HR 73 | Temp 97.1°F | Ht 60.0 in | Wt 166.8 lb

## 2022-07-15 DIAGNOSIS — J4521 Mild intermittent asthma with (acute) exacerbation: Secondary | ICD-10-CM

## 2022-07-15 DIAGNOSIS — N3001 Acute cystitis with hematuria: Secondary | ICD-10-CM

## 2022-07-15 LAB — POCT URINALYSIS DIP (CLINITEK)
Bilirubin, UA: NEGATIVE
Glucose, UA: NEGATIVE mg/dL
Ketones, POC UA: NEGATIVE mg/dL
Nitrite, UA: NEGATIVE
Spec Grav, UA: 1.01 (ref 1.010–1.025)
Urobilinogen, UA: 0.2 E.U./dL
pH, UA: 6.5 (ref 5.0–8.0)

## 2022-07-15 MED ORDER — TRIAMCINOLONE ACETONIDE 40 MG/ML IJ SUSP
60.0000 mg | Freq: Once | INTRAMUSCULAR | Status: AC
  Administered 2022-07-15: 60 mg via INTRAMUSCULAR

## 2022-07-15 MED ORDER — DOXYCYCLINE HYCLATE 100 MG PO TABS: 100.0000 mg | ORAL_TABLET | Freq: Two times a day (BID) | ORAL | 0 refills | Status: DC

## 2022-07-15 NOTE — Progress Notes (Signed)
Acute Office Visit  Subjective:    [name], female    DOB: November 29, 1955, 66 y.o.   MRN: [id number]  Chief Complaint  Patient presents with   Cough    HPI: Patient is in today for follow up of uri - she was treated with zpack and then prednisone was started 3 days ago.  She has improved but still has a cough.  She is only taking her singulair at this time and not claritin - recommend to start. Overall doing better and is having slightly productive cough now  Pt states this morning she started having dysuria and urgency.  Denies hematuria, back pain or fever  Past Medical History:  Diagnosis Date   GERD (gastroesophageal reflux disease)    Major depressive disorder, single episode, moderate (HCC)    Mild intermittent asthma with (acute) exacerbation    Nontoxic single thyroid nodule     Past Surgical History:  Procedure Laterality Date   CESAREAN SECTION  1982 & 1998   X2   THYROIDECTOMY Right     Family History  Problem Relation Age of Onset   Lung cancer Mother    COPD Mother    Heart attack Father    Depression Sister    Suicidality Sister    Lung cancer Brother    COPD Brother    Breast cancer Neg Hx     Social History   Socioeconomic History   Marital status: Married    Spouse name: Not on file   Number of children: 2   Years of education: Not on file   Highest education level: Not on file  Occupational History   Not on file  Tobacco Use   Smoking status: Never   Smokeless tobacco: Never  Vaping Use   Vaping Use: Never used  Substance and Sexual Activity   Alcohol use: Not Currently   Drug use: Never   Sexual activity: Not on file  Other Topics Concern   Not on file  Social History Narrative   Not on file   Social Determinants of Health   Financial Resource Strain: Low Risk  (09/18/2021)   Overall Financial Resource Strain (CARDIA)    Difficulty of Paying Living Expenses: Not hard at all  Food Insecurity: No Food Insecurity  (09/18/2021)   Hunger Vital Sign    Worried About Running Out of Food in the Last Year: Never true    Ran Out of Food in the Last Year: Never true  Transportation Needs: No Transportation Needs (09/18/2021)   PRAPARE - Hydrologist (Medical): No    Lack of Transportation (Non-Medical): No  Physical Activity: Insufficiently Active (09/18/2021)   Exercise Vital Sign    Days of Exercise per Week: 3 days    Minutes of Exercise per Session: 30 min  Stress: No Stress Concern Present (09/18/2021)   [place]    Feeling of Stress : Not at all  Social Connections: [place] (09/18/2021)   Social Connection and Isolation Panel [NHANES]    Frequency of Communication with Friends and Family: More than three times a week    Frequency of Social Gatherings with Friends and Family: Twice a week    Attends Religious Services: More than 4 times per year    Active Member of [employer] or Organizations: Yes    Attends Archivist Meetings: More than 4 times per year    Marital Status:  Married  Intimate Partner Violence: Not At Risk (09/18/2021)   Humiliation, Afraid, Rape, and Kick questionnaire    Fear of Current or Ex-Partner: No    Emotionally Abused: No    Physically Abused: No    Sexually Abused: No    Outpatient Medications Prior to Visit  Medication Sig Dispense Refill   albuterol (PROVENTIL) (2.5 MG/3ML) 0.083% nebulizer solution Take 3 mLs (2.5 mg total) by nebulization every 6 (six) hours as needed for wheezing or shortness of breath. 75 mL 3   albuterol (VENTOLIN HFA) 108 (90 Base) MCG/ACT inhaler Inhale 2 puffs into the lungs every 4 (four) hours as needed for wheezing or shortness of breath. 18 g 2   atorvastatin (LIPITOR) 10 MG tablet TAKE 1 TABLET BY MOUTH DAILY 90 tablet 1   celecoxib (CELEBREX) 200 MG capsule Take 1 capsule (200 mg total) by mouth daily. 90 capsule 1    fluticasone (FLONASE) 50 MCG/ACT nasal spray Place 2 sprays into both nostrils daily. 16 g 3   Fluticasone-Umeclidin-Vilant (TRELEGY ELLIPTA) 100-62.5-25 MCG/ACT AEPB Inhale 1 puff into the lungs daily. 1 each 0   guaiFENesin-codeine (ROBITUSSIN AC) 100-10 MG/5ML syrup Take 5 mLs by mouth 3 (three) times daily as needed for cough. 120 mL 0   loratadine (CLARITIN) 10 MG tablet Take 1 tablet (10 mg total) by mouth daily. 30 tablet 11   metoprolol succinate (TOPROL-XL) 25 MG 24 hr tablet TAKE ONE TABLET BY MOUTH EVERY DAY 90 tablet 3   montelukast (SINGULAIR) 10 MG tablet TAKE ONE TABLET BY MOUTH EVERY DAY 90 tablet 1   pantoprazole (PROTONIX) 40 MG tablet TAKE ONE TABLET BY MOUTH EVERY DAY 90 tablet 1   predniSONE (DELTASONE) 20 MG tablet Take 3 tablets (60 mg total) by mouth daily with breakfast for 3 days, THEN 2 tablets (40 mg total) daily with breakfast for 3 days, THEN 1 tablet (20 mg total) daily with breakfast for 3 days. 18 tablet 0   venlafaxine XR (EFFEXOR-XR) 37.5 MG 24 hr capsule TAKE 1 CAPSULE BY MOUTH EVERY DAY 90 capsule 1   Vitamin D, Ergocalciferol, (DRISDOL) 1.25 MG (50000 UNIT) CAPS capsule TAKE 1 CAPSULE BY MOUTH EVERY SEVEN DAYS 12 capsule 1   No facility-administered medications prior to visit.    No Known Allergies  Review of Systems CONSTITUTIONAL: Negative for chills, fatigue, fever, E/N/T: Negative for ear pain, nasal congestion and sore throat.  CARDIOVASCULAR: Negative for chest pain, dizziness, RESPIRATORY: see HPI GASTROINTESTINAL: Negative for abdominal pain, acid reflux symptoms, constipation, diarrhea, nausea and vomiting.  GU - see HPI        Objective:    Physical Exam PHYSICAL EXAM:   VS: BP 130/76 (BP Location: Left Arm, Patient Position: Sitting, Cuff Size: Normal)   Pulse 73   Temp (!) 97.1 F (36.2 C) (Temporal)   Ht 5' (1.524 m)   Wt 166 lb 12.8 oz (75.7 kg)   LMP  (LMP Unknown)   SpO2 96%   BMI 32.58 kg/m   GEN: Well nourished, well  developed, in no acute distress   Cardiac: RRR; no murmurs, rubs,  Respiratory:  normal respiratory rate and pattern with no distress - only rare exp wheeze noted Skin: warm and dry, no rash   Office Visit on 07/15/2022  Component Date Value Ref Range Status   Color, UA 07/15/2022 yellow  yellow Final   Clarity, UA 07/15/2022 cloudy (A)  clear Final   Glucose, UA 07/15/2022 negative  negative mg/dL Final  Bilirubin, UA 07/15/2022 negative  negative Final   Ketones, POC UA 07/15/2022 negative  negative mg/dL Final   Spec Grav, UA 07/15/2022 1.010  1.010 - 1.025 Final   Blood, UA 07/15/2022 large (A)  negative Final   pH, UA 07/15/2022 6.5  5.0 - 8.0 Final   POC PROTEIN,UA 07/15/2022 trace  negative, trace Final   Urobilinogen, UA 07/15/2022 0.2  0.2 or 1.0 E.U./dL Final   Nitrite, UA 07/15/2022 Negative  Negative Final   Leukocytes, UA 07/15/2022 Large (3+) (A)  Negative Final     BP 130/76 (BP Location: Left Arm, Patient Position: Sitting, Cuff Size: Normal)   Pulse 73   Temp (!) 97.1 F (36.2 C) (Temporal)   Ht 5' (1.524 m)   Wt 166 lb 12.8 oz (75.7 kg)   LMP  (LMP Unknown)   SpO2 96%   BMI 32.58 kg/m  Wt Readings from Last 3 Encounters:  07/15/22 166 lb 12.8 oz (75.7 kg)  07/09/22 166 lb (75.3 kg)  03/27/22 163 lb (73.9 kg)    Health Maintenance Due  Topic Date Due   TETANUS/TDAP  Never done   Zoster Vaccines- Shingrix (1 of 2) Never done   INFLUENZA VACCINE  05/28/2022    There are no preventive care reminders to display for this patient.   Lab Results  Component Value Date   TSH 2.150 03/27/2022   Lab Results  Component Value Date   WBC 6.6 03/27/2022   HGB 13.3 03/27/2022   HCT 40.0 03/27/2022   MCV 92 03/27/2022   PLT 223 03/27/2022   Lab Results  Component Value Date   NA 144 03/27/2022   K 4.8 03/27/2022   CO2 26 03/27/2022   GLUCOSE 100 (H) 03/27/2022   BUN 9 03/27/2022   CREATININE 0.64 03/27/2022   BILITOT 0.4 03/27/2022   ALKPHOS 97  03/27/2022   AST 18 03/27/2022   ALT 19 03/27/2022   PROT 6.6 03/27/2022   ALBUMIN 4.3 03/27/2022   CALCIUM 9.1 03/27/2022   EGFR 97 03/27/2022   Lab Results  Component Value Date   CHOL 149 03/27/2022   Lab Results  Component Value Date   HDL 52 03/27/2022   Lab Results  Component Value Date   LDLCALC 78 03/27/2022   Lab Results  Component Value Date   TRIG 101 03/27/2022   Lab Results  Component Value Date   CHOLHDL 2.9 03/27/2022   No results found for: "HGBA1C"     Assessment & Plan:   Problem List Items Addressed This Visit       Genitourinary   UTI (urinary tract infection) - Primary   Relevant Medications   doxycycline (VIBRA-TABS) 100 MG tablet   Other Relevant Orders   POCT URINALYSIS DIP (CLINITEK) (Completed)   Urine Culture   Other Visit Diagnoses     Mild intermittent asthma with acute exacerbation       Relevant Medications   triamcinolone acetonide (KENALOG-40) injection 60 mg (Start on 07/15/2022  8:45 AM)      Meds ordered this encounter  Medications   triamcinolone acetonide (KENALOG-40) injection 60 mg   doxycycline (VIBRA-TABS) 100 MG tablet    Sig: Take 1 tablet (100 mg total) by mouth 2 (two) times daily.    Dispense:  20 tablet    Refill:  0    Order Specific Question:   Supervising Provider    AnswerShelton Silvas    Orders Placed This Encounter  Procedures  Urine Culture   POCT URINALYSIS DIP (CLINITEK)     Follow-up: Return if symptoms worsen or fail to improve.  An After Visit Summary was printed and given to the patient.  Yetta Flock [facility] [phone]

## 2022-07-18 ENCOUNTER — Other Ambulatory Visit: Payer: Self-pay | Admitting: Physician Assistant

## 2022-07-18 LAB — URINE CULTURE

## 2022-07-18 MED ORDER — NITROFURANTOIN MONOHYD MACRO 100 MG PO CAPS: 100.0000 mg | ORAL_CAPSULE | Freq: Two times a day (BID) | ORAL | 0 refills | Status: DC

## 2022-08-01 ENCOUNTER — Other Ambulatory Visit: Payer: Self-pay | Admitting: Physician Assistant

## 2022-08-01 ENCOUNTER — Ambulatory Visit (INDEPENDENT_AMBULATORY_CARE_PROVIDER_SITE_OTHER): Payer: Medicare HMO

## 2022-08-01 DIAGNOSIS — N3090 Cystitis, unspecified without hematuria: Secondary | ICD-10-CM | POA: Diagnosis not present

## 2022-08-01 DIAGNOSIS — N3001 Acute cystitis with hematuria: Secondary | ICD-10-CM

## 2022-08-01 LAB — POCT URINALYSIS DIP (CLINITEK)
Bilirubin, UA: NEGATIVE
Blood, UA: NEGATIVE
Glucose, UA: NEGATIVE mg/dL
Ketones, POC UA: NEGATIVE mg/dL
Nitrite, UA: NEGATIVE
POC PROTEIN,UA: NEGATIVE
Spec Grav, UA: 1.015 (ref 1.010–1.025)
Urobilinogen, UA: 0.2 E.U./dL
pH, UA: 6 (ref 5.0–8.0)

## 2022-08-01 MED ORDER — CIPROFLOXACIN HCL 500 MG PO TABS: 500.0000 mg | ORAL_TABLET | Freq: Two times a day (BID) | ORAL | 0 refills | Status: AC

## 2022-08-01 NOTE — Progress Notes (Signed)
Patient came in today for repeat UA, Patient did have leukocytes in urine and stated that she is having some burning.  Provider made aware.

## 2022-08-02 LAB — URINE CULTURE

## 2022-08-19 DIAGNOSIS — R519 Headache, unspecified: Secondary | ICD-10-CM | POA: Diagnosis not present

## 2022-08-19 DIAGNOSIS — S161XXA Strain of muscle, fascia and tendon at neck level, initial encounter: Secondary | ICD-10-CM | POA: Diagnosis not present

## 2022-08-19 DIAGNOSIS — M546 Pain in thoracic spine: Secondary | ICD-10-CM | POA: Diagnosis not present

## 2022-08-19 DIAGNOSIS — S39012A Strain of muscle, fascia and tendon of lower back, initial encounter: Secondary | ICD-10-CM | POA: Diagnosis not present

## 2022-08-19 DIAGNOSIS — M4312 Spondylolisthesis, cervical region: Secondary | ICD-10-CM | POA: Diagnosis not present

## 2022-08-19 DIAGNOSIS — S29019A Strain of muscle and tendon of unspecified wall of thorax, initial encounter: Secondary | ICD-10-CM | POA: Diagnosis not present

## 2022-08-19 DIAGNOSIS — I700 Atherosclerosis of aorta: Secondary | ICD-10-CM | POA: Diagnosis not present

## 2022-08-19 DIAGNOSIS — M545 Low back pain, unspecified: Secondary | ICD-10-CM | POA: Diagnosis not present

## 2022-08-26 ENCOUNTER — Telehealth: Payer: Self-pay | Admitting: Physician Assistant

## 2022-08-26 ENCOUNTER — Ambulatory Visit: Payer: Medicare HMO | Admitting: Physician Assistant
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
## 2022-09-03 ENCOUNTER — Telehealth: Payer: Self-pay
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
## 2022-10-01 ENCOUNTER — Ambulatory Visit (INDEPENDENT_AMBULATORY_CARE_PROVIDER_SITE_OTHER): Payer: Medicare HMO | Admitting: Physician Assistant

## 2022-10-01 ENCOUNTER — Encounter: Payer: Self-pay | Admitting: Physician Assistant

## 2022-10-01 VITALS — BP 102/60 | HR 70 | Temp 97.9°F | Ht 60.0 in | Wt 166.6 lb

## 2022-10-01 DIAGNOSIS — K219 Gastro-esophageal reflux disease without esophagitis: Secondary | ICD-10-CM

## 2022-10-01 DIAGNOSIS — K130 Diseases of lips: Secondary | ICD-10-CM

## 2022-10-01 DIAGNOSIS — I10 Essential (primary) hypertension: Secondary | ICD-10-CM | POA: Diagnosis not present

## 2022-10-01 DIAGNOSIS — F321 Major depressive disorder, single episode, moderate: Secondary | ICD-10-CM

## 2022-10-01 DIAGNOSIS — Z23 Encounter for immunization: Secondary | ICD-10-CM

## 2022-10-01 DIAGNOSIS — J4520 Mild intermittent asthma, uncomplicated: Secondary | ICD-10-CM

## 2022-10-01 DIAGNOSIS — E782 Mixed hyperlipidemia: Secondary | ICD-10-CM | POA: Diagnosis not present

## 2022-10-01 DIAGNOSIS — I4729 Other ventricular tachycardia: Secondary | ICD-10-CM

## 2022-10-01 DIAGNOSIS — E559 Vitamin D deficiency, unspecified: Secondary | ICD-10-CM

## 2022-10-01 DIAGNOSIS — R002 Palpitations: Secondary | ICD-10-CM

## 2022-10-01 DIAGNOSIS — J060 Acute laryngopharyngitis: Secondary | ICD-10-CM

## 2022-10-01 HISTORY — DX: Mixed hyperlipidemia: E78.2

## 2022-10-01 HISTORY — DX: Diseases of lips: K13.0

## 2022-10-01 HISTORY — DX: Vitamin D deficiency, unspecified: E55.9

## 2022-10-01 HISTORY — DX: Mild intermittent asthma, uncomplicated: J45.20

## 2022-10-01 MED ORDER — METOPROLOL SUCCINATE ER 25 MG PO TB24: 25.0000 mg | ORAL_TABLET | Freq: Every day | ORAL | 1 refills | Status: DC

## 2022-10-01 MED ORDER — PANTOPRAZOLE SODIUM 40 MG PO TBEC: 40.0000 mg | DELAYED_RELEASE_TABLET | Freq: Every day | ORAL | 1 refills | Status: DC

## 2022-10-01 MED ORDER — TRELEGY ELLIPTA 100-62.5-25 MCG/ACT IN AEPB: 1.0000 | INHALATION_SPRAY | Freq: Every day | RESPIRATORY_TRACT | 0 refills | Status: DC

## 2022-10-01 MED ORDER — ATORVASTATIN CALCIUM 10 MG PO TABS: 10.0000 mg | ORAL_TABLET | Freq: Every day | ORAL | 1 refills | Status: DC

## 2022-10-01 MED ORDER — KETOCONAZOLE 2 % EX CREA: 1.0000 | TOPICAL_CREAM | Freq: Two times a day (BID) | CUTANEOUS | 0 refills | Status: DC

## 2022-10-01 MED ORDER — MONTELUKAST SODIUM 10 MG PO TABS: 10.0000 mg | ORAL_TABLET | Freq: Every day | ORAL | 1 refills | Status: DC

## 2022-10-01 MED ORDER — VITAMIN D (ERGOCALCIFEROL) 1.25 MG (50000 UNIT) PO CAPS: ORAL_CAPSULE | ORAL | 1 refills | Status: DC

## 2022-10-01 NOTE — Progress Notes (Signed)
Subjective:  [name], female    DOB: 04/16/1956  Age: 66 y.o. MRN: [id number]  Chief Complaint  Patient presents with   Hyperlipidemia    HPI  Pt presents for follow up of hypertension, PSVT and nonsustained Vtach - pt states that she has seen cardiology Dr Servando Salinaobb in the past and has a follow up in March however recently she has been having palpitations again - recommend she go ahead and make follow up appt with cardiology before that time (I will send request) The patient is tolerating the medication well without side effects. Compliance with treatment has been good; including taking medication as directed , maintains a healthy diet and regular exercise regimen , and following up as directed. Her current medication for this is metoprolol succinate XL 25mg  qd  Mixed hyperlipidemia  Pt presents with hyperlipidemia.  Compliance with treatment has been good -The patient is compliant with medications, maintains a low cholesterol diet , follows up as directed , and maintains an exercise regimen . The patient denies experiencing any hypercholesterolemia related symptoms. Currently on lipitor 10mg  qd  Pt with history of mild intermittent asthma.  Currently she is having no symptoms - meds include albuterol, flonase, claritin, singulair, and trelegy  Pt with history of GERD - stable on protonix 40mg  qd  Pt with history of vit D def - due for labwork and requests refill of medication  Pt with history of mild depression - stable on effexor XR 37.5mg  qd  Pt would like flu shot today  Pt has has in corners of mouth - red and burns at times  Current Outpatient Medications on File Prior to Visit  Medication Sig Dispense Refill   albuterol (PROVENTIL) (2.5 MG/3ML) 0.083% nebulizer solution Take 3 mLs (2.5 mg total) by nebulization every 6 (six) hours as needed for wheezing or shortness of breath. 75 mL 3   albuterol (VENTOLIN HFA) 108 (90 Base) MCG/ACT inhaler Inhale 2 puffs into the  lungs every 4 (four) hours as needed for wheezing or shortness of breath. 18 g 2   celecoxib (CELEBREX) 200 MG capsule Take 1 capsule (200 mg total) by mouth daily. 90 capsule 1   fluticasone (FLONASE) 50 MCG/ACT nasal spray Place 2 sprays into both nostrils daily. 16 g 3   loratadine (CLARITIN) 10 MG tablet Take 1 tablet (10 mg total) by mouth daily. 30 tablet 11   venlafaxine XR (EFFEXOR-XR) 37.5 MG 24 hr capsule TAKE 1 CAPSULE BY MOUTH EVERY DAY 90 capsule 1   No current facility-administered medications on file prior to visit.   Past Medical History:  Diagnosis Date   GERD (gastroesophageal reflux disease)    Major depressive disorder, single episode, moderate (HCC)    Mild intermittent asthma with (acute) exacerbation    Nontoxic single thyroid nodule    Past Surgical History:  Procedure Laterality Date   CESAREAN SECTION  1982 & 1998   X2   THYROIDECTOMY Right     Family History  Problem Relation Age of Onset   Lung cancer Mother    COPD Mother    Heart attack Father    Depression Sister    Suicidality Sister    Lung cancer Brother    COPD Brother    Breast cancer Neg Hx    Social History   Socioeconomic History   Marital status: Married    Spouse name: Not on file   Number of children: 2   Years of education: Not on file  Highest education level: Not on file  Occupational History   Not on file  Tobacco Use   Smoking status: Never   Smokeless tobacco: Never  Vaping Use   Vaping Use: Never used  Substance and Sexual Activity   Alcohol use: Not Currently   Drug use: Never   Sexual activity: Not on file  Other Topics Concern   Not on file  Social History Narrative   Not on file   Social Determinants of Health   Financial Resource Strain: Low Risk  (09/18/2021)   Overall Financial Resource Strain (CARDIA)    Difficulty of Paying Living Expenses: Not hard at all  Food Insecurity: No Food Insecurity (09/18/2021)   Hunger Vital Sign    Worried About  Running Out of Food in the Last Year: Never true    Ran Out of Food in the Last Year: Never true  Transportation Needs: No Transportation Needs (09/18/2021)   PRAPARE - Administrator, Civil Service (Medical): No    Lack of Transportation (Non-Medical): No  Physical Activity: Insufficiently Active (09/18/2021)   Exercise Vital Sign    Days of Exercise per Week: 3 days    Minutes of Exercise per Session: 30 min  Stress: No Stress Concern Present (09/18/2021)   [employer] of Occupational Health - Occupational Stress Questionnaire    Feeling of Stress : Not at all  Social Connections: Socially Integrated (09/18/2021)   Social Connection and Isolation Panel [NHANES]    Frequency of Communication with Friends and Family: More than three times a week    Frequency of Social Gatherings with Friends and Family: Twice a week    Attends Religious Services: More than 4 times per year    Active Member of [employer] or Organizations: Yes    Attends Engineer, structural: More than 4 times per year    Marital Status: Married    Review of Systems  CONSTITUTIONAL: Negative for chills, fatigue, fever, unintentional weight gain and unintentional weight loss.  E/N/T: Negative for ear pain, nasal congestion and sore throat.  CARDIOVASCULAR: pt has been having palpitations and has noted some mild exertional dyspnea recently RESPIRATORY: Negative for recent cough   GASTROINTESTINAL: Negative for abdominal pain, acid reflux symptoms, constipation, diarrhea, nausea and vomiting.  MSK: Negative for arthralgias and myalgias.  INTEGUMENTARY: see HPI NEUROLOGICAL: Negative for dizziness and headaches.  PSYCHIATRIC: Negative for sleep disturbance and to question depression screen.  Negative for depression, negative for anhedonia.      Objective:  PHYSICAL EXAM:   VS: BP 102/60 (BP Location: Left Arm, Patient Position: Sitting, Cuff Size: Large)   Pulse 70   Temp 97.9 F (36.6 C)  (Temporal)   Ht 5' (1.524 m)   Wt 166 lb 9.6 oz (75.6 kg)   LMP  (LMP Unknown)   SpO2 97%   BMI 32.54 kg/m   GEN: Well nourished, well developed, in no acute distress  Neck: no JVD or masses - no thyromegaly Cardiac: RRR; no murmurs, rubs, or gallops,no edema - no significant varicosities Respiratory:  normal respiratory rate and pattern with no distress - normal breath sounds with no rales, rhonchi, wheezes or rubs MS: no deformity or atrophy  Skin: red, cracked areas in corners of mouth Neuro:  Alert and Oriented x 3, Strength and sensation are intact - CN II-Xii grossly intact Psych: euthymic mood, appropriate affect and demeanor  EKG - normal    10/01/2022    8:45 AM 03/08/2022  8:12 AM 02/20/2022    1:46 PM 12/11/2021   10:59 AM 09/18/2021    9:58 AM  Depression screen PHQ 2/9  Decreased Interest 0 0 0 0 0  Down, Depressed, Hopeless 0 0 0 0 0  PHQ - 2 Score 0 0 0 0 0  Altered sleeping 1   0   Tired, decreased energy 0   0   Change in appetite 0   0   Feeling bad or failure about yourself  0   0   Trouble concentrating 0   0   Moving slowly or fidgety/restless 0   0   Suicidal thoughts 0   0   PHQ-9 Score 1   0   Difficult doing work/chores Not difficult at all   Not difficult at all     Lab Results  Component Value Date   WBC 6.6 03/27/2022   HGB 13.3 03/27/2022   HCT 40.0 03/27/2022   PLT 223 03/27/2022   GLUCOSE 100 (H) 03/27/2022   CHOL 149 03/27/2022   TRIG 101 03/27/2022   HDL 52 03/27/2022   LDLCALC 78 03/27/2022   ALT 19 03/27/2022   AST 18 03/27/2022   NA 144 03/27/2022   K 4.8 03/27/2022   CL 106 03/27/2022   CREATININE 0.64 03/27/2022   BUN 9 03/27/2022   CO2 26 03/27/2022   TSH 2.150 03/27/2022      Assessment & Plan:   Problem List Items Addressed This Visit       Cardiovascular and Mediastinum   NSVT (nonsustained ventricular tachycardia) (HCC) - Primary   Relevant Medications   atorvastatin (LIPITOR) 10 MG tablet   metoprolol  succinate (TOPROL-XL) 25 MG 24 hr tablet   Other Relevant Orders   EKG 12-Lead   Ambulatory referral to Cardiology   Primary hypertension   Relevant Medications   atorvastatin (LIPITOR) 10 MG tablet   metoprolol succinate (TOPROL-XL) 25 MG 24 hr tablet   Other Relevant Orders   CBC with Differential/Platelet   Comprehensive metabolic panel     Respiratory   Mild intermittent asthma without complication   Relevant Medications   Fluticasone-Umeclidin-Vilant (TRELEGY ELLIPTA) 100-62.5-25 MCG/ACT AEPB   montelukast (SINGULAIR) 10 MG tablet     Digestive   GERD (gastroesophageal reflux disease)   Relevant Medications   pantoprazole (PROTONIX) 40 MG tablet   Angular cheilitis   Relevant Medications   ketoconazole (NIZORAL) 2 % cream     Other   Major depressive disorder, single episode, moderate (HCC)   Relevant Orders   TSH Continue effexor XR as directed   Needs flu shot   Relevant Orders   Flu Vaccine QUAD High Dose(Fluad) (Completed)   Palpitations   Relevant Orders   CBC with Differential/Platelet   Comprehensive metabolic panel   TSH   EKG 12-Lead   Ambulatory referral to Cardiology   Vitamin D deficiency   Relevant Orders   VITAMIN D 25 Hydroxy (Vit-D Deficiency, Fractures)   Mixed hyperlipidemia   Relevant Medications   atorvastatin (LIPITOR) 10 MG tablet   metoprolol succinate (TOPROL-XL) 25 MG 24 hr tablet   Other Relevant Orders   Lipid panel     Acute laryngopharyngitis       Relevant Medications   montelukast (SINGULAIR) 10 MG tablet   Vitamin D insufficiency       Relevant Medications   Vitamin D, Ergocalciferol, (DRISDOL) 1.25 MG (50000 UNIT) CAPS capsule     .  Meds ordered this encounter  Medications   ketoconazole (NIZORAL) 2 % cream    Sig: Apply 1 Application topically 2 (two) times daily. To mouth rash    Dispense:  15 g    Refill:  0    Order Specific Question:   Supervising Provider    Answer:   Corey Harold    atorvastatin (LIPITOR) 10 MG tablet    Sig: Take 1 tablet (10 mg total) by mouth daily.    Dispense:  90 tablet    Refill:  1    Order Specific Question:   Supervising Provider    Answer:   Corey Harold   Fluticasone-Umeclidin-Vilant (TRELEGY ELLIPTA) 100-62.5-25 MCG/ACT AEPB    Sig: Inhale 1 puff into the lungs daily.    Dispense:  1 each    Refill:  0    Order Specific Question:   Supervising Provider    Answer:   Corey Harold   metoprolol succinate (TOPROL-XL) 25 MG 24 hr tablet    Sig: Take 1 tablet (25 mg total) by mouth daily.    Dispense:  90 tablet    Refill:  1    Order Specific Question:   Supervising Provider    Answer:   COX, KIRSTEN [[id number]]   montelukast (SINGULAIR) 10 MG tablet    Sig: Take 1 tablet (10 mg total) by mouth daily.    Dispense:  90 tablet    Refill:  1    Order Specific Question:   Supervising Provider    Answer:   Corey Harold   pantoprazole (PROTONIX) 40 MG tablet    Sig: Take 1 tablet (40 mg total) by mouth daily.    Dispense:  90 tablet    Refill:  1    Order Specific Question:   Supervising Provider    Answer:   COX, Aniceto Boss   Vitamin D, Ergocalciferol, (DRISDOL) 1.25 MG (50000 UNIT) CAPS capsule    Sig: TAKE 1 CAPSULE BY MOUTH EVERY SEVEN DAYS    Dispense:  12 capsule    Refill:  1    Order Specific Question:   Supervising Provider    AnswerCorey Harold    Orders Placed This Encounter  Procedures   Flu Vaccine QUAD High Dose(Fluad)   CBC with Differential/Platelet   Comprehensive metabolic panel   TSH   Lipid panel   VITAMIN D 25 Hydroxy (Vit-D Deficiency, Fractures)   Ambulatory referral to Cardiology   EKG 12-Lead     Follow-up: Return in about 6 months (around 04/02/2023) for chronic fasting follow up.  An After Visit Summary was printed and given to the patient.  Jettie Pagan [facility] [phone]

## 2022-10-02 DIAGNOSIS — I10 Essential (primary) hypertension: Secondary | ICD-10-CM | POA: Diagnosis not present

## 2022-10-02 DIAGNOSIS — R002 Palpitations: Secondary | ICD-10-CM | POA: Diagnosis not present

## 2022-10-02 DIAGNOSIS — E559 Vitamin D deficiency, unspecified: Secondary | ICD-10-CM | POA: Diagnosis not present

## 2022-10-02 DIAGNOSIS — E782 Mixed hyperlipidemia: Secondary | ICD-10-CM | POA: Diagnosis not present

## 2022-10-02 DIAGNOSIS — F321 Major depressive disorder, single episode, moderate: Secondary | ICD-10-CM | POA: Diagnosis not present

## 2022-10-03 LAB — COMPREHENSIVE METABOLIC PANEL
ALT: 18 IU/L (ref 0–32)
AST: 18 IU/L (ref 0–40)
Albumin/Globulin Ratio: 1.8 (ref 1.2–2.2)
Albumin: 4.2 g/dL (ref 3.9–4.9)
Alkaline Phosphatase: 90 IU/L (ref 44–121)
BUN/Creatinine Ratio: 21 (ref 12–28)
BUN: 14 mg/dL (ref 8–27)
Bilirubin Total: 0.4 mg/dL (ref 0.0–1.2)
CO2: 23 mmol/L (ref 20–29)
Calcium: 9.1 mg/dL (ref 8.7–10.3)
Chloride: 103 mmol/L (ref 96–106)
Creatinine, Ser: 0.67 mg/dL (ref 0.57–1.00)
Globulin, Total: 2.4 g/dL (ref 1.5–4.5)
Glucose: 100 mg/dL — ABNORMAL HIGH (ref 70–99)
Potassium: 4.4 mmol/L (ref 3.5–5.2)
Sodium: 143 mmol/L (ref 134–144)
Total Protein: 6.6 g/dL (ref 6.0–8.5)
eGFR: 96 mL/min/{1.73_m2} (ref >59)

## 2022-10-03 LAB — CBC WITH DIFFERENTIAL/PLATELET
Basophils Absolute: 0 10*3/uL (ref 0.0–0.2)
Basos: 1 %
EOS (ABSOLUTE): 0.1 10*3/uL (ref 0.0–0.4)
Eos: 2 %
Hematocrit: 40 % (ref 34.0–46.6)
Hemoglobin: 13.7 g/dL (ref 11.1–15.9)
Immature Grans (Abs): 0 10*3/uL (ref 0.0–0.1)
Immature Granulocytes: 0 %
Lymphocytes Absolute: 1.6 10*3/uL (ref 0.7–3.1)
Lymphs: 24 %
MCH: 31.6 pg (ref 26.6–33.0)
MCHC: 34.3 g/dL (ref 31.5–35.7)
MCV: 92 fL (ref 79–97)
Monocytes Absolute: 0.5 10*3/uL (ref 0.1–0.9)
Monocytes: 8 %
Neutrophils Absolute: 4.3 10*3/uL (ref 1.4–7.0)
Neutrophils: 65 %
Platelets: 182 10*3/uL (ref 150–450)
RBC: 4.33 x10E6/uL (ref 3.77–5.28)
RDW: 12.6 % (ref 11.7–15.4)
WBC: 6.6 10*3/uL (ref 3.4–10.8)

## 2022-10-03 LAB — LIPID PANEL
Chol/HDL Ratio: 2.4 ratio (ref 0.0–4.4)
Cholesterol, Total: 146 mg/dL (ref 100–199)
HDL: 61 mg/dL (ref >39)
LDL Chol Calc (NIH): 68 mg/dL (ref 0–99)
Triglycerides: 89 mg/dL (ref 0–149)
VLDL Cholesterol Cal: 17 mg/dL (ref 5–40)

## 2022-10-03 LAB — CARDIOVASCULAR RISK ASSESSMENT

## 2022-10-03 LAB — VITAMIN D 25 HYDROXY (VIT D DEFICIENCY, FRACTURES): Vit D, 25-Hydroxy: 54.6 ng/mL (ref 30.0–100.0)

## 2022-10-03 LAB — TSH: TSH: 1.52 u[IU]/mL (ref 0.450–4.500)

## 2022-11-07 ENCOUNTER — Encounter: Payer: Self-pay | Admitting: Cardiology

## 2022-11-07 ENCOUNTER — Ambulatory Visit (INDEPENDENT_AMBULATORY_CARE_PROVIDER_SITE_OTHER): Payer: Medicare HMO

## 2022-11-07 ENCOUNTER — Ambulatory Visit: Payer: Medicare HMO | Attending: Cardiology | Admitting: Cardiology

## 2022-11-07 VITALS — BP 124/64 | HR 66 | Ht 60.0 in | Wt 169.4 lb

## 2022-11-07 DIAGNOSIS — R0602 Shortness of breath: Secondary | ICD-10-CM

## 2022-11-07 DIAGNOSIS — E782 Mixed hyperlipidemia: Secondary | ICD-10-CM | POA: Diagnosis not present

## 2022-11-07 DIAGNOSIS — R002 Palpitations: Secondary | ICD-10-CM

## 2022-11-07 DIAGNOSIS — I4729 Other ventricular tachycardia: Secondary | ICD-10-CM | POA: Diagnosis not present

## 2022-11-07 DIAGNOSIS — I10 Essential (primary) hypertension: Secondary | ICD-10-CM | POA: Diagnosis not present

## 2022-11-07 DIAGNOSIS — I4719 Other supraventricular tachycardia: Secondary | ICD-10-CM

## 2022-11-07 NOTE — Patient Instructions (Signed)
Medication Instructions:  Your physician recommends that you continue on your current medications as directed. Please refer to the Current Medication list given to you today.  *If you need a refill on your cardiac medications before your next appointment, please call your pharmacy*   Lab Work: None   Testing/Procedures: Your physician has requested that you have an echocardiogram. Echocardiography is a painless test that uses sound waves to create images of your heart. It provides your doctor with information about the size and shape of your heart and how well your heart's chambers and valves are working. This procedure takes approximately one hour. There are no restrictions for this procedure. Please do NOT wear cologne, perfume, aftershave, or lotions (deodorant is allowed). Please arrive 15 minutes prior to your appointment time.  ZIO XT- Long Term Monitor Instructions  Your physician has requested you wear a ZIO patch monitor for 14 days.  This is a single patch monitor. [employer] supplies one patch monitor per enrollment. Additional stickers are not available. Please do not apply patch if you will be having a Nuclear Stress Test,  Echocardiogram, Cardiac CT, MRI, or Chest Xray during the period you would be wearing the  monitor. The patch cannot be worn during these tests. You cannot remove and re-apply the  ZIO XT patch monitor.  Your ZIO patch monitor will be mailed 3 day USPS to your address on file. It may take 3-5 days  to receive your monitor after you have been enrolled.  Once you have received your monitor, please review the enclosed instructions. Your monitor  has already been registered assigning a specific monitor serial # to you.  Billing and Patient Assistance Program Information  We have supplied [employer] with any of your insurance information on file for billing purposes. [employer] offers a sliding scale Patient Assistance Program for patients that do not have   insurance, or whose insurance does not completely cover the cost of the ZIO monitor.  You must apply for the Patient Assistance Program to qualify for this discounted rate.  To apply, please call [employer] at [phone], select option 4, select option 2, ask to apply for  Patient Assistance Program. Theodore Demark will ask your household income, and how many people  are in your household. They will quote your out-of-pocket cost based on that information.  [employer] will also be able to set up a 91-month interest-free payment plan if needed.  Applying the monitor   Shave hair from upper left chest.  Hold abrader disc by orange tab. Rub abrader in 40 strokes over the upper left chest as  indicated in your monitor instructions.  Clean area with 4 enclosed alcohol pads. Let dry.  Apply patch as indicated in monitor instructions. Patch will be placed under collarbone on left  side of chest with arrow pointing upward.  Rub patch adhesive wings for 2 minutes. Remove white label marked "1". Remove the white  label marked "2". Rub patch adhesive wings for 2 additional minutes.  While looking in a mirror, press and release button in center of patch. A small green light will  flash 3-4 times. This will be your only indicator that the monitor has been turned on.  Do not shower for the first 24 hours. You may shower after the first 24 hours.  Press the button if you feel a symptom. You will hear a small click. Record Date, Time and  Symptom in the Patient Logbook.  When you are ready to remove the patch, follow  instructions on the last 2 pages of Patient  Logbook. Stick patch monitor onto the last page of Patient Logbook.  Place Patient Logbook in the blue and white box. Use locking tab on box and tape box closed  securely. The blue and white box has prepaid postage on it. Please place it in the mailbox as  soon as possible. Your physician should have your test results approximately 7 days after the  monitor  has been mailed back to [facility].  Call [place] at [phone] if you have questions regarding  your ZIO XT patch monitor. Call them immediately if you see an orange light blinking on your  monitor.  If your monitor falls off in less than 4 days, contact our Monitor department at [phone].  If your monitor becomes loose or falls off after 4 days call [employer] at [phone] for  suggestions on securing your monitor    Follow-Up: At [facility], you and your health needs are our priority.  As part of our continuing mission to provide you with exceptional heart care, we have created designated Provider Care Teams.  These Care Teams include your primary Cardiologist (physician) and Advanced Practice Providers (APPs -  Physician Assistants and Nurse Practitioners) who all work together to provide you with the care you need, when you need it.  We recommend signing up for the patient portal called "MyChart".  Sign up information is provided on this After Visit Summary.  MyChart is used to connect with patients for Virtual Visits (Telemedicine).  Patients are able to view lab/test results, encounter notes, upcoming appointments, etc.  Non-urgent messages can be sent to your provider as well.   To learn more about what you can do with MyChart, go to [email or web address].    Your next appointment:   6 month(s)  Provider:   Berniece Salines, DO     Other Instructions

## 2022-11-07 NOTE — Progress Notes (Unsigned)
Enrolled for [employer] to mail a ZIO XT long term holter monitor to the patients address on file.  

## 2022-11-07 NOTE — Progress Notes (Signed)
Cardiology Office Note:    Date:  11/08/2022   ID:  Brittany Short, DOB 10-20-1956, MRN [id number]  PCP:  Marge Duncans, PA-C  Cardiologist:  Berniece Salines, DO  Electrophysiologist:  None   Referring MD: Marge Duncans, PA-C   " I am doing well"  History of Present Illness:    Brittany Short is a 67 y.o. female with a hx of major depressive disorder, GERD presents today.    I saw the patient on 10/02/20 at that time. During that visit I started the patient on low dose beta blocker due to Toprol 12.5mg  . She also reported chest pain therefore a CCTA was ordered.    I did see the patient on October 31, 2020 via virtual visit.  At that time she was experiencing improvement on the metoprolol with the palpitations.  She had no questions  She was seen on July 11, 2021 at that time her blood pressure was slightly increased she also then had evidence of paroxysmal atrial tachycardia 916 ventricular tachycardia her monitor.  At that time I increased her metoprolol to 25 mg daily.  She was seen on 01/08/2022 at that time she was doing well.  Recently she has been experiencing intermittent palpitations and worsening shortness of breath.    Past Medical History:  Diagnosis Date   GERD (gastroesophageal reflux disease)    Major depressive disorder, single episode, moderate (HCC)    Mild intermittent asthma with (acute) exacerbation    Nontoxic single thyroid nodule     Past Surgical History:  Procedure Laterality Date   CESAREAN SECTION  1982 & 1998   X2   THYROIDECTOMY Right     Current Medications: Current Meds  Medication Sig   albuterol (PROVENTIL) (2.5 MG/3ML) 0.083% nebulizer solution Take 3 mLs (2.5 mg total) by nebulization every 6 (six) hours as needed for wheezing or shortness of breath.   albuterol (VENTOLIN HFA) 108 (90 Base) MCG/ACT inhaler Inhale 2 puffs into the lungs every 4 (four) hours as needed for wheezing or shortness of breath.   atorvastatin (LIPITOR) 10 MG tablet  Take 1 tablet (10 mg total) by mouth daily.   celecoxib (CELEBREX) 200 MG capsule Take 1 capsule (200 mg total) by mouth daily.   fluticasone (FLONASE) 50 MCG/ACT nasal spray Place 2 sprays into both nostrils daily.   Fluticasone-Umeclidin-Vilant (TRELEGY ELLIPTA) 100-62.5-25 MCG/ACT AEPB Inhale 1 puff into the lungs daily.   ketoconazole (NIZORAL) 2 % cream Apply 1 Application topically 2 (two) times daily. To mouth rash   loratadine (CLARITIN) 10 MG tablet Take 1 tablet (10 mg total) by mouth daily.   metoprolol succinate (TOPROL-XL) 25 MG 24 hr tablet Take 1 tablet (25 mg total) by mouth daily.   montelukast (SINGULAIR) 10 MG tablet Take 1 tablet (10 mg total) by mouth daily.   pantoprazole (PROTONIX) 40 MG tablet Take 1 tablet (40 mg total) by mouth daily.   venlafaxine XR (EFFEXOR-XR) 37.5 MG 24 hr capsule TAKE 1 CAPSULE BY MOUTH EVERY DAY   Vitamin D, Ergocalciferol, (DRISDOL) 1.25 MG (50000 UNIT) CAPS capsule TAKE 1 CAPSULE BY MOUTH EVERY SEVEN DAYS     Allergies:   Patient has no known allergies.   Social History   Socioeconomic History   Marital status: Married    Spouse name: Not on file   Number of children: 2   Years of education: Not on file   Highest education level: Not on file  Occupational History   Not on file  Tobacco Use   Smoking status: Never   Smokeless tobacco: Never  Vaping Use   Vaping Use: Never used  Substance and Sexual Activity   Alcohol use: Not Currently   Drug use: Never   Sexual activity: Not on file  Other Topics Concern   Not on file  Social History Narrative   Not on file   Social Determinants of Health   Financial Resource Strain: Low Risk  (09/18/2021)   Overall Financial Resource Strain (CARDIA)    Difficulty of Paying Living Expenses: Not hard at all  Food Insecurity: No Food Insecurity (09/18/2021)   Hunger Vital Sign    Worried About Running Out of Food in the Last Year: Never true    Ran Out of Food in the Last Year: Never  true  Transportation Needs: No Transportation Needs (09/18/2021)   PRAPARE - Administrator, Civil Service (Medical): No    Lack of Transportation (Non-Medical): No  Physical Activity: Insufficiently Active (09/18/2021)   Exercise Vital Sign    Days of Exercise per Week: 3 days    Minutes of Exercise per Session: 30 min  Stress: No Stress Concern Present (09/18/2021)   [employer] of Occupational Health - Occupational Stress Questionnaire    Feeling of Stress : Not at all  Social Connections: Socially Integrated (09/18/2021)   Social Connection and Isolation Panel [NHANES]    Frequency of Communication with Friends and Family: More than three times a week    Frequency of Social Gatherings with Friends and Family: Twice a week    Attends Religious Services: More than 4 times per year    Active Member of [employer] or Organizations: Yes    Attends Engineer, structural: More than 4 times per year    Marital Status: Married     Family History: The patient's family history includes COPD in her brother and mother; Depression in her sister; Heart attack in her father; Lung cancer in her brother and mother; Suicidality in her sister. There is no history of Breast cancer.  ROS:   Review of Systems  Constitution: Negative for decreased appetite, fever and weight gain.  HENT: Negative for congestion, ear discharge, hoarse voice and sore throat.   Eyes: Negative for discharge, redness, vision loss in right eye and visual halos.  Cardiovascular: Negative for chest pain, dyspnea on exertion, leg swelling, orthopnea and palpitations.  Respiratory: Negative for cough, hemoptysis, shortness of breath and snoring.   Endocrine: Negative for heat intolerance and polyphagia.  Hematologic/Lymphatic: Negative for bleeding problem. Does not bruise/bleed easily.  Skin: Negative for flushing, nail changes, rash and suspicious lesions.  Musculoskeletal: Negative for arthritis, joint  pain, muscle cramps, myalgias, neck pain and stiffness.  Gastrointestinal: Negative for abdominal pain, bowel incontinence, diarrhea and excessive appetite.  Genitourinary: Negative for decreased libido, genital sores and incomplete emptying.  Neurological: Negative for brief paralysis, focal weakness, headaches and loss of balance.  Psychiatric/Behavioral: Negative for altered mental status, depression and suicidal ideas.  Allergic/Immunologic: Negative for HIV exposure and persistent infections.    EKGs/Labs/Other Studies Reviewed:    The following studies were reviewed today:   EKG:  The ekg ordered today demonstrates sinus rhythm, heart rate 67 bpm compared to prior EKG no significant change.  Transthoracic echocardiogram IMPRESSIONS   1. Left ventricular ejection fraction, by estimation, is 60 to 65%. The  left ventricle has normal function. The left ventricle has no regional  wall motion abnormalities. There is mild concentric left  ventricular  hypertrophy. Left ventricular diastolic  parameters were normal.   2. Right ventricular systolic function is normal. The right ventricular  size is normal. There is normal pulmonary artery systolic pressure.   3. The mitral valve is normal in structure. Mild mitral valve  regurgitation. No evidence of mitral stenosis.   4. The aortic valve is tricuspid. Aortic valve regurgitation is not  visualized. No aortic stenosis is present.   5. The inferior vena cava is normal in size with greater than 50%  respiratory variability, suggesting right atrial pressure of 3 mmHg.    Zio  minimum heart rate was 55 bpm, maximum heart rate was 171 bpm, and average heart rate was 78 bpm. Predominant underlying rhythm was Sinus Rhythm.   1 run of Ventricular Tachycardia of occurred lasting 7 beats with a maximum rate of 146 bpm (average 132 bpm).    8 Supraventricular Tachycardia runs occurred, the run with the fastest interval lasting 5 beats with a  maximum rate of 171 bpm, the longest lasting 11 beats with an averge rate of 141 bpm.   Premature atrial complexes were rare (<1.0%). Premature Ventricular complexes were rare (<1.0%).   No pauses, No AV block and no atrial fibrillation present. 21  patient triggered events: 10 associated ventricular ectopy, 1 associated with supraventricular tachycardia and the remaining associated with sinus rhythm.   Conclusion: This study is remarkable for the following:                                           1. Nonsustained ventricular tachycardia                                           2. Paroxysmal Supraventricular tachycardia which is likely atrial tachycardia with variable block.  Recent Labs: 10/02/2022: ALT 18; BUN 14; Creatinine, Ser 0.67; Hemoglobin 13.7; Platelets 182; Potassium 4.4; Sodium 143; TSH 1.520  Recent Lipid Panel    Component Value Date/Time   CHOL 146 10/02/2022 0854   TRIG 89 10/02/2022 0854   HDL 61 10/02/2022 0854   CHOLHDL 2.4 10/02/2022 0854   LDLCALC 68 10/02/2022 0854    Physical Exam:    VS:  BP 124/64   Pulse 66   Ht 5' (1.524 m)   Wt 76.8 kg   LMP  (LMP Unknown)   SpO2 96%   BMI 33.08 kg/m     Wt Readings from Last 3 Encounters:  11/07/22 76.8 kg  10/01/22 75.6 kg  07/15/22 75.7 kg     GEN: Well nourished, well developed in no acute distress HEENT: Normal NECK: No JVD; No carotid bruits LYMPHATICS: No lymphadenopathy CARDIAC: S1S2 noted,RRR, no murmurs, rubs, gallops RESPIRATORY:  Clear to auscultation without rales, wheezing or rhonchi  ABDOMEN: Soft, non-tender, non-distended, +bowel sounds, no guarding. EXTREMITIES: No edema, No cyanosis, no clubbing MUSCULOSKELETAL:  No deformity  SKIN: Warm and dry NEUROLOGIC:  Alert and oriented x 3, non-focal PSYCHIATRIC:  Normal affect, good insight  ASSESSMENT:    1. SOB (shortness of breath)   2. Palpitations   3. NSVT (nonsustained ventricular tachycardia) (HCC)   4. PAT (paroxysmal atrial  tachycardia)   5. Primary hypertension   6. Mixed hyperlipidemia     PLAN:    Will get an  echo to assess for any structural abnormalities.  She is experiencing presyncope with her palpitations will place a monitor to make sure that she is not going out of rhythm.  Recent CCTA with no evidence of coronary artery disease.  The patient is in agreement with the above plan. The patient left the office in stable condition.  The patient will follow up in 6 months.   Medication Adjustments/Labs and Tests Ordered: Current medicines are reviewed at length with the patient today.  Concerns regarding medicines are outlined above.  Orders Placed This Encounter  Procedures   LONG TERM MONITOR (3-14 DAYS)   ECHOCARDIOGRAM COMPLETE   No orders of the defined types were placed in this encounter.   Patient Instructions  Medication Instructions:  Your physician recommends that you continue on your current medications as directed. Please refer to the Current Medication list given to you today.  *If you need a refill on your cardiac medications before your next appointment, please call your pharmacy*   Lab Work: None   Testing/Procedures: Your physician has requested that you have an echocardiogram. Echocardiography is a painless test that uses sound waves to create images of your heart. It provides your doctor with information about the size and shape of your heart and how well your heart's chambers and valves are working. This procedure takes approximately one hour. There are no restrictions for this procedure. Please do NOT wear cologne, perfume, aftershave, or lotions (deodorant is allowed). Please arrive 15 minutes prior to your appointment time.  ZIO XT- Long Term Monitor Instructions  Your physician has requested you wear a ZIO patch monitor for 14 days.  This is a single patch monitor. [employer] supplies one patch monitor per enrollment. Additional stickers are not available. Please do  not apply patch if you will be having a Nuclear Stress Test,  Echocardiogram, Cardiac CT, MRI, or Chest Xray during the period you would be wearing the  monitor. The patch cannot be worn during these tests. You cannot remove and re-apply the  ZIO XT patch monitor.  Your ZIO patch monitor will be mailed 3 day USPS to your address on file. It may take 3-5 days  to receive your monitor after you have been enrolled.  Once you have received your monitor, please review the enclosed instructions. Your monitor  has already been registered assigning a specific monitor serial # to you.  Billing and Patient Assistance Program Information  We have supplied [employer] with any of your insurance information on file for billing purposes. [employer] offers a sliding scale Patient Assistance Program for patients that do not have  insurance, or whose insurance does not completely cover the cost of the ZIO monitor.  You must apply for the Patient Assistance Program to qualify for this discounted rate.  To apply, please call [employer] at [phone], select option 4, select option 2, ask to apply for  Patient Assistance Program. Meredeth Ide will ask your household income, and how many people  are in your household. They will quote your out-of-pocket cost based on that information.  [employer] will also be able to set up a 18-month, interest-free payment plan if needed.  Applying the monitor   Shave hair from upper left chest.  Hold abrader disc by orange tab. Rub abrader in 40 strokes over the upper left chest as  indicated in your monitor instructions.  Clean area with 4 enclosed alcohol pads. Let dry.  Apply patch as indicated in monitor instructions. Patch will be placed under  collarbone on left  side of chest with arrow pointing upward.  Rub patch adhesive wings for 2 minutes. Remove white label marked "1". Remove the white  label marked "2". Rub patch adhesive wings for 2 additional minutes.  While looking in a  mirror, press and release button in center of patch. A small green light will  flash 3-4 times. This will be your only indicator that the monitor has been turned on.  Do not shower for the first 24 hours. You may shower after the first 24 hours.  Press the button if you feel a symptom. You will hear a small click. Record Date, Time and  Symptom in the Patient Logbook.  When you are ready to remove the patch, follow instructions on the last 2 pages of Patient  Logbook. Stick patch monitor onto the last page of Patient Logbook.  Place Patient Logbook in the blue and white box. Use locking tab on box and tape box closed  securely. The blue and white box has prepaid postage on it. Please place it in the mailbox as  soon as possible. Your physician should have your test results approximately 7 days after the  monitor has been mailed back to [facility].  Call [place] at [phone] if you have questions regarding  your ZIO XT patch monitor. Call them immediately if you see an orange light blinking on your  monitor.  If your monitor falls off in less than 4 days, contact our Monitor department at [phone].  If your monitor becomes loose or falls off after 4 days call [employer] at [phone] for  suggestions on securing your monitor    Follow-Up: At [facility], you and your health needs are our priority.  As part of our continuing mission to provide you with exceptional heart care, we have created designated Provider Care Teams.  These Care Teams include your primary Cardiologist (physician) and Advanced Practice Providers (APPs -  Physician Assistants and Nurse Practitioners) who all work together to provide you with the care you need, when you need it.  We recommend signing up for the patient portal called "MyChart".  Sign up information is provided on this After Visit Summary.  MyChart is used to connect with patients for Virtual Visits (Telemedicine).   Patients are able to view lab/test results, encounter notes, upcoming appointments, etc.  Non-urgent messages can be sent to your provider as well.   To learn more about what you can do with MyChart, go to [email or web address].    Your next appointment:   6 month(s)  Provider:   Berniece Salines, DO     Other Instructions     Adopting a Healthy Lifestyle.  Know what a healthy weight is for you (roughly BMI <25) and aim to maintain this   Aim for 7+ servings of fruits and vegetables daily   65-80+ fluid ounces of water or unsweet tea for healthy kidneys   Limit to max 1 drink of alcohol per day; avoid smoking/tobacco   Limit animal fats in diet for cholesterol and heart health - choose grass fed whenever available   Avoid highly processed foods, and foods high in saturated/trans fats   Aim for low stress - take time to unwind and care for your mental health   Aim for 150 min of moderate intensity exercise weekly for heart health, and weights twice weekly for bone health   Aim for 7-9 hours of sleep daily   When it comes to diets,  agreement about the perfect plan isnt easy to find, even among the experts. Experts at the [employer] [facility] [employer] developed an idea known as the Healthy Eating Plate. Just imagine a plate divided into logical, healthy portions.   The emphasis is on diet quality:   Load up on vegetables and fruits - one-half of your plate: Aim for color and variety, and remember that potatoes dont count.   Go for whole grains - one-quarter of your plate: Whole wheat, barley, wheat berries, quinoa, oats, brown rice, and foods made with them. If you want pasta, go with whole wheat pasta.   Protein power - one-quarter of your plate: Fish, chicken, beans, and nuts are all healthy, versatile protein sources. Limit red meat.   The diet, however, does go beyond the plate, offering a few other suggestions.   Use healthy plant oils, such as olive, canola, soy,  corn, sunflower and peanut. Check the labels, and avoid partially hydrogenated oil, which have unhealthy trans fats.   If youre thirsty, drink water. Coffee and tea are good in moderation, but skip sugary drinks and limit milk and dairy products to one or two daily servings.   The type of carbohydrate in the diet is more important than the amount. Some sources of carbohydrates, such as vegetables, fruits, whole grains, and beans-are healthier than others.   Finally, stay active  Signed, Thomasene Ripple, DO  11/08/2022 9:40 PM    [facility]

## 2022-11-15 ENCOUNTER — Ambulatory Visit: Payer: Medicare HMO | Attending: Cardiology

## 2022-11-15 DIAGNOSIS — R002 Palpitations: Secondary | ICD-10-CM

## 2022-11-15 DIAGNOSIS — R0602 Shortness of breath: Secondary | ICD-10-CM

## 2022-11-15 LAB — ECHOCARDIOGRAM COMPLETE
Area-P 1/2: 4.15 cm2
S' Lateral: 2.9 cm

## 2022-11-18 ENCOUNTER — Telehealth: Payer: Self-pay | Admitting: Cardiology

## 2022-11-18 NOTE — Telephone Encounter (Signed)
Spoke with patient who wanted to know if she could use a muscle stimulator at the chiropractor while wearing Zio. Recommended that she not use the stimulator until she has completed her wear time on the Zio. She stated her chiropractor thought the same.

## 2022-11-18 NOTE — Telephone Encounter (Signed)
Patient calling  to speak with Dr. Terrial Rhodes nurse. She is wearing a heart monitor and went to the chiropractor today and they wanted to do electrical shocks but she was unsure if this would skew the heart monitor results so they did not do them. She wants to know if she goes back would this be something she could do. Pt hung up while on hold.

## 2022-12-06 DIAGNOSIS — R002 Palpitations: Secondary | ICD-10-CM | POA: Diagnosis not present

## 2022-12-15 DIAGNOSIS — U071 COVID-19: Secondary | ICD-10-CM | POA: Diagnosis not present

## 2022-12-15 DIAGNOSIS — R509 Fever, unspecified: Secondary | ICD-10-CM | POA: Diagnosis not present

## 2022-12-15 DIAGNOSIS — J069 Acute upper respiratory infection, unspecified: Secondary | ICD-10-CM | POA: Diagnosis not present

## 2022-12-16 ENCOUNTER — Telehealth: Payer: Self-pay

## 2022-12-16 NOTE — Telephone Encounter (Signed)
Patient daughter called and stated she started having a fever on late Saturday night and they took her to urgent care on Sunday and they got a called today that she was positive for Covid and was told by urgent care that she would need to see her PCP to get medication for Covid. Please advise

## 2022-12-16 NOTE — Telephone Encounter (Signed)
Records are in chart, care everywhere [facility]

## 2022-12-16 NOTE — Telephone Encounter (Signed)
Patient daughter call back and the doctor called them from urgent care and sent in rx, he stated he don't know why they was told that patient needed to call PCP, he sent her in something for Covid.

## 2022-12-16 NOTE — Telephone Encounter (Signed)
They called [facility] this morning from [facility] and stated that her Covid was positive and her flu and RSV was negative.

## 2022-12-17 ENCOUNTER — Encounter: Payer: Self-pay | Admitting: Physician Assistant

## 2022-12-18 ENCOUNTER — Other Ambulatory Visit: Payer: Self-pay | Admitting: Physician Assistant

## 2022-12-18 ENCOUNTER — Encounter: Payer: Self-pay | Admitting: Physician Assistant

## 2022-12-18 MED ORDER — VALACYCLOVIR HCL 1 G PO TABS: ORAL_TABLET | ORAL | 0 refills | Status: DC

## 2022-12-18 NOTE — Telephone Encounter (Signed)
Patient will sent pictures

## 2022-12-18 NOTE — Telephone Encounter (Signed)
I will send in valtrex - take as directed Recommend to keep lips moisturized also

## 2022-12-20 ENCOUNTER — Other Ambulatory Visit: Payer: Self-pay

## 2022-12-20 DIAGNOSIS — F321 Major depressive disorder, single episode, moderate: Secondary | ICD-10-CM

## 2022-12-20 MED ORDER — VENLAFAXINE HCL ER 37.5 MG PO CP24: ORAL_CAPSULE | ORAL | 1 refills | Status: DC

## 2023-01-01 ENCOUNTER — Telehealth: Payer: Self-pay | Admitting: Cardiology

## 2023-01-01 NOTE — Telephone Encounter (Signed)
Patient is calling for results to her monitor.

## 2023-01-01 NOTE — Telephone Encounter (Signed)
Returned call to patient and advised that monitor results not yet reviewed but that we will contact once they are. Patient verbalized understanding. Will forward to MD

## 2023-01-12 DIAGNOSIS — B379 Candidiasis, unspecified: Secondary | ICD-10-CM | POA: Diagnosis not present

## 2023-01-12 DIAGNOSIS — T3695XA Adverse effect of unspecified systemic antibiotic, initial encounter: Secondary | ICD-10-CM | POA: Diagnosis not present

## 2023-01-12 DIAGNOSIS — L03211 Cellulitis of face: Secondary | ICD-10-CM | POA: Diagnosis not present

## 2023-01-23 ENCOUNTER — Ambulatory Visit: Payer: Medicare HMO | Attending: Cardiology | Admitting: Cardiology

## 2023-01-23 VITALS — BP 132/80 | HR 83 | Ht 60.0 in | Wt 165.0 lb

## 2023-01-23 DIAGNOSIS — I10 Essential (primary) hypertension: Secondary | ICD-10-CM | POA: Diagnosis not present

## 2023-01-23 DIAGNOSIS — I4729 Other ventricular tachycardia: Secondary | ICD-10-CM | POA: Diagnosis not present

## 2023-01-23 DIAGNOSIS — I4719 Other supraventricular tachycardia: Secondary | ICD-10-CM | POA: Diagnosis not present

## 2023-01-23 DIAGNOSIS — E782 Mixed hyperlipidemia: Secondary | ICD-10-CM

## 2023-01-23 DIAGNOSIS — I471 Supraventricular tachycardia, unspecified: Secondary | ICD-10-CM | POA: Diagnosis not present

## 2023-01-23 MED ORDER — METOPROLOL SUCCINATE ER 25 MG PO TB24: 25.0000 mg | ORAL_TABLET | Freq: Two times a day (BID) | ORAL | 2 refills | Status: DC

## 2023-01-23 NOTE — Patient Instructions (Signed)
Medication Instructions:   Start Metoprolol 25 MG twice daily  *If you need a refill on your cardiac medications before your next appointment, please call your pharmacy*   Lab Work: None ordered   Testing/Procedures: None ordered   Follow-Up: At [facility], you and your health needs are our priority.  As part of our continuing mission to provide you with exceptional heart care, we have created designated Provider Care Teams.  These Care Teams include your primary Cardiologist (physician) and Advanced Practice Providers (APPs -  Physician Assistants and Nurse Practitioners) who all work together to provide you with the care you need, when you need it.  We recommend signing up for the patient portal called "MyChart".  Sign up information is provided on this After Visit Summary.  MyChart is used to connect with patients for Virtual Visits (Telemedicine).  Patients are able to view lab/test results, encounter notes, upcoming appointments, etc.  Non-urgent messages can be sent to your provider as well.   To learn more about what you can do with MyChart, go to [email or web address].    Your next appointment:   6 month(s)  Provider:   Berniece Salines, DO

## 2023-01-23 NOTE — Progress Notes (Unsigned)
Virtual Visit via Video Note   Because of Roderick Henard's co-morbid illnesses, she is at least at moderate risk for complications without adequate follow up.  This format is felt to be most appropriate for this patient at this time.  All issues noted in this document were discussed and addressed.  A limited physical exam was performed with this format.  Please refer to the patient's chart for her consent to telehealth for [facility].      Date:  01/28/2023   ID:  Brittany Short, DOB 10/16/1956, MRN [id number]  Patient Location: Home Provider Location: Office/Clinic  PCP:  Marge Duncans, PA-C  Cardiologist:  Berniece Salines, DO  Electrophysiologist:  None   Evaluation Performed:  Follow-Up Visit  Chief Complaint:    History of Present Illness:    Brittany Short is a 67 y.o. female with major depressive disorder, GERD presents today.    I saw the patient on 10/02/20 at that time. During that visit I started the patient on low dose beta blocker due to Toprol 12.5mg  . She also reported chest pain therefore a CCTA was ordered.    I did see the patient on October 31, 2020 via virtual visit.  At that time she was experiencing improvement on the metoprolol with the palpitations.  She had no questions   She was seen on July 11, 2021 at that time her blood pressure was slightly increased she also then had evidence of paroxysmal atrial tachycardia 916 ventricular tachycardia her monitor.  At that time I increased her metoprolol to 25 mg daily.  At her visit on November 07, 2022 she reported that she was experiencing worsening palpitations.  With presyncope episode I placed a monitor on the patient.  She is here today virtually to discuss this.  The patient does not have symptoms concerning for COVID-19 infection (fever, chills, cough, or new shortness of breath).    Past Medical History:  Diagnosis Date   GERD (gastroesophageal reflux disease)    Major depressive disorder,  single episode, moderate (HCC)    Mild intermittent asthma with (acute) exacerbation    Nontoxic single thyroid nodule    Past Surgical History:  Procedure Laterality Date   CESAREAN SECTION  1982 & 1998   X2   THYROIDECTOMY Right      Current Meds  Medication Sig   albuterol (PROVENTIL) (2.5 MG/3ML) 0.083% nebulizer solution Take 3 mLs (2.5 mg total) by nebulization every 6 (six) hours as needed for wheezing or shortness of breath.   albuterol (VENTOLIN HFA) 108 (90 Base) MCG/ACT inhaler Inhale 2 puffs into the lungs every 4 (four) hours as needed for wheezing or shortness of breath.   atorvastatin (LIPITOR) 10 MG tablet Take 1 tablet (10 mg total) by mouth daily.   celecoxib (CELEBREX) 200 MG capsule Take 1 capsule (200 mg total) by mouth daily.   fluticasone (FLONASE) 50 MCG/ACT nasal spray Place 2 sprays into both nostrils daily.   Fluticasone-Umeclidin-Vilant (TRELEGY ELLIPTA) 100-62.5-25 MCG/ACT AEPB Inhale 1 puff into the lungs daily.   loratadine (CLARITIN) 10 MG tablet Take 1 tablet (10 mg total) by mouth daily.   metoprolol succinate (TOPROL-XL) 25 MG 24 hr tablet Take 1 tablet (25 mg total) by mouth in the morning and at bedtime.   montelukast (SINGULAIR) 10 MG tablet Take 1 tablet (10 mg total) by mouth daily.   pantoprazole (PROTONIX) 40 MG tablet Take 1 tablet (40 mg total) by mouth daily.   venlafaxine XR (EFFEXOR-XR) 37.5  MG 24 hr capsule TAKE 1 CAPSULE BY MOUTH EVERY DAY   Vitamin D, Ergocalciferol, (DRISDOL) 1.25 MG (50000 UNIT) CAPS capsule TAKE 1 CAPSULE BY MOUTH EVERY SEVEN DAYS   [DISCONTINUED] metoprolol succinate (TOPROL-XL) 25 MG 24 hr tablet Take 1 tablet (25 mg total) by mouth daily.     Allergies:   Patient has no known allergies.   Social History   Tobacco Use   Smoking status: Never   Smokeless tobacco: Never  Vaping Use   Vaping Use: Never used  Substance Use Topics   Alcohol use: Not Currently   Drug use: Never     Family Hx: The patient's  family history includes COPD in her brother and mother; Depression in her sister; Heart attack in her father; Lung cancer in her brother and mother; Suicidality in her sister. There is no history of Breast cancer.  ROS:   Please see the history of present illness.    palpitations All other systems reviewed and are negative.   Prior CV studies:   The following studies were reviewed today:  [employer]io 11/2022 Patch Wear Time:  12 days and 5 hours (2024-01-19T14:39:14-0500 to 2024-01-31T20:31:31-0500)   Patient had a min HR of 51 bpm, max HR of 203 bpm, and avg HR of 72 bpm. Predominant underlying rhythm was Sinus Rhythm.    1 run of Ventricular Tachycardia occurred lasting 5 beats with a max rate of 203 bpm (avg 199 bpm). 3 Supraventricular Tachycardia runs occurred, the run with the fastest interval lasting 5 beats with a max rate of 167 bpm, the longest lasting 12.8 secs with an avg rate of 118 bpm. Some episodes of Supraventricular Tachycardia may be possible Atrial Tachycardia with variable block.  Supraventricular Tachycardia was detected within +/- 45 seconds of symptomatic patient event(s). Isolated SVEs were rare (<1.0%), SVE Couplets were rare (<1.0%), and SVE Triplets were rare (<1.0%). Isolated VEs were rare (<1.0%), and no VE Couplets or VE Triplets were present. Ventricular Bigeminy and Trigeminy were present.   Symptoms associated with supraventricular tachycardia.   Conclusion: This study is remarkable for the following:                       1.  Nonsustained ventricular tachycardia                       2.  Symptomatic paroxysmal supraventricular tachycardia.    TTE 10/2022 IMPRESSIONS    1. GS -13.1. Left ventricular ejection fraction, by estimation, is 60 to  65%. The left ventricle has normal function. The left ventricle has no  regional wall motion abnormalities. Left ventricular diastolic parameters  are consistent with Grade II  diastolic dysfunction (pseudonormalization).    2. Right ventricular systolic function is normal. The right ventricular  size is normal. There is normal pulmonary artery systolic pressure.   3. The mitral valve is normal in structure. Mild mitral valve  regurgitation. No evidence of mitral stenosis.   4. The aortic valve is normal in structure. Aortic valve regurgitation is  not visualized. No aortic stenosis is present.   5. The inferior vena cava is normal in size with greater than 50%  respiratory variability, suggesting right atrial pressure of 3 mmHg.   FINDINGS   Left Ventricle: GS -13.1. Left ventricular ejection fraction, by  estimation, is 60 to 65%. The left ventricle has normal function. The left  ventricle has no regional wall motion abnormalities. The left ventricular  internal cavity size was normal in  size. There is no left ventricular hypertrophy. Left ventricular diastolic  parameters are consistent with Grade II diastolic dysfunction  (pseudonormalization).   Right Ventricle: The right ventricular size is normal. No increase in  right ventricular wall thickness. Right ventricular systolic function is  normal. There is normal pulmonary artery systolic pressure. The tricuspid  regurgitant velocity is 2.11 m/s, and   with an assumed right atrial pressure of 3 mmHg, the estimated right  ventricular systolic pressure is [id number] mmHg.   Left Atrium: Left atrial size was normal in size.   Right Atrium: Right atrial size was normal in size.   Pericardium: There is no evidence of pericardial effusion.   Mitral Valve: The mitral valve is normal in structure. Mild mitral valve  regurgitation. No evidence of mitral valve stenosis.   Tricuspid Valve: The tricuspid valve is normal in structure. Tricuspid  valve regurgitation is trivial. No evidence of tricuspid stenosis.   Aortic Valve: The aortic valve is normal in structure. Aortic valve  regurgitation is not visualized. No aortic stenosis is present.   Pulmonic  Valve: The pulmonic valve was normal in structure. Pulmonic valve  regurgitation is not visualized. No evidence of pulmonic stenosis.   Aorta: The aortic root is normal in size and structure.   Venous: The inferior vena cava is normal in size with greater than 50%  respiratory variability, suggesting right atrial pressure of 3 mmHg.   IAS/Shunts: No atrial level shunt detected by color flow Doppler.   Labs/Other Tests and Data Reviewed:    EKG:  No ECG reviewed.  Recent Labs: 10/02/2022: ALT 18; BUN 14; Creatinine, Ser 0.67; Hemoglobin 13.7; Platelets 182; Potassium 4.4; Sodium 143; TSH 1.520   Recent Lipid Panel Lab Results  Component Value Date/Time   CHOL 146 10/02/2022 08:54 AM   TRIG 89 10/02/2022 08:54 AM   HDL 61 10/02/2022 08:54 AM   CHOLHDL 2.4 10/02/2022 08:54 AM   LDLCALC 68 10/02/2022 08:54 AM    Wt Readings from Last 3 Encounters:  01/23/23 165 lb (74.8 kg)  11/07/22 169 lb 6.4 oz (76.8 kg)  10/01/22 166 lb 9.6 oz (75.6 kg)     Objective:    Vital Signs:  BP 132/80   Pulse 83   Ht 5' (1.524 m)   Wt 165 lb (74.8 kg)   LMP  (LMP Unknown)   BMI 32.22 kg/m      ASSESSMENT & PLAN:    NSVT PSVT  She still has baseline palpitation I discussed with the patient her monitor results which show NSVT as well as PSVT beta-blocker.  Will increase her Toprol-XL to 25 mg twice a day.  No other changes at this time. Will see her in 6 months.  COVID-19 Education: The signs and symptoms of COVID-19 were discussed with the patient and how to seek care for testing (follow up with PCP or arrange E-visit).  The importance of social distancing was discussed today.  Time:   Today, I have spent 15 minutes with the patient with telehealth technology discussing the above problems.     Medication Adjustments/Labs and Tests Ordered: Current medicines are reviewed at length with the patient today.  Concerns regarding medicines are outlined above.   Tests Ordered: No  orders of the defined types were placed in this encounter.   Medication Changes: Meds ordered this encounter  Medications   metoprolol succinate (TOPROL-XL) 25 MG 24 hr tablet    Sig: Take 1  tablet (25 mg total) by mouth in the morning and at bedtime.    Dispense:  180 tablet    Refill:  2    Follow Up:  In Person in 9 month(s)  Signed, Berniece Salines, DO  01/28/2023 12:54 PM    [place]

## 2023-01-28 ENCOUNTER — Encounter: Payer: Self-pay | Admitting: Cardiology

## 2023-03-03 ENCOUNTER — Encounter: Payer: Self-pay | Admitting: Physician Assistant

## 2023-03-04 ENCOUNTER — Encounter: Payer: Self-pay | Admitting: Family Medicine

## 2023-03-04 ENCOUNTER — Ambulatory Visit (INDEPENDENT_AMBULATORY_CARE_PROVIDER_SITE_OTHER): Payer: Medicare HMO | Admitting: Family Medicine

## 2023-03-04 VITALS — BP 134/60 | HR 70 | Temp 97.2°F | Ht 60.0 in | Wt 172.0 lb

## 2023-03-04 DIAGNOSIS — R051 Acute cough: Secondary | ICD-10-CM | POA: Diagnosis not present

## 2023-03-04 DIAGNOSIS — J4521 Mild intermittent asthma with (acute) exacerbation: Secondary | ICD-10-CM | POA: Diagnosis not present

## 2023-03-04 LAB — POC COVID19 BINAXNOW: SARS Coronavirus 2 Ag: NEGATIVE

## 2023-03-04 MED ORDER — TRIAMCINOLONE ACETONIDE 40 MG/ML IJ SUSP
80.0000 mg | Freq: Once | INTRAMUSCULAR | Status: AC
  Administered 2023-03-04: 80 mg via INTRAMUSCULAR

## 2023-03-04 MED ORDER — PREDNISONE 10 MG PO TABS: ORAL_TABLET | ORAL | 0 refills | Status: AC

## 2023-03-04 MED ORDER — IPRATROPIUM-ALBUTEROL 0.5-2.5 (3) MG/3ML IN SOLN
3.0000 mL | Freq: Once | RESPIRATORY_TRACT | Status: AC
  Administered 2023-03-04: 3 mL via RESPIRATORY_TRACT

## 2023-03-04 NOTE — Progress Notes (Unsigned)
Acute Office Visit  Subjective:    [name], female    DOB: 29-Jan-1956, 67 y.o.   MRN: [id number]  Chief Complaint  Patient presents with   Cough    HPI: Patient is in today for cough x 1 week, states it is getting worse, has a nebulizer at home (last used it last night). Dry cough. Denies sore throat, fever or chills, facial pain or pressure. Robitussin does not help nor tessalon pearls.nausea COVID 19 NEGATIVE  Past Medical History:  Diagnosis Date   GERD (gastroesophageal reflux disease)    Major depressive disorder, single episode, moderate (HCC)    Mild intermittent asthma with (acute) exacerbation    Nontoxic single thyroid nodule     Past Surgical History:  Procedure Laterality Date   CESAREAN SECTION  1982 & 1998   X2   THYROIDECTOMY Right     Family History  Problem Relation Age of Onset   Lung cancer Mother    COPD Mother    Heart attack Father    Depression Sister    Suicidality Sister    Lung cancer Brother    COPD Brother    Breast cancer Neg Hx     Social History   Socioeconomic History   Marital status: Married    Spouse name: Not on file   Number of children: 2   Years of education: Not on file   Highest education level: Not on file  Occupational History   Not on file  Tobacco Use   Smoking status: Never   Smokeless tobacco: Never  Vaping Use   Vaping Use: Never used  Substance and Sexual Activity   Alcohol use: Not Currently   Drug use: Never   Sexual activity: Not on file  Other Topics Concern   Not on file  Social History Narrative   Not on file   Social Determinants of Health   Financial Resource Strain: Low Risk  (09/18/2021)   Overall Financial Resource Strain (CARDIA)    Difficulty of Paying Living Expenses: Not hard at all  Food Insecurity: No Food Insecurity (09/18/2021)   Hunger Vital Sign    Worried About Running Out of Food in the Last Year: Never true    Ran Out of Food in the Last Year: Never  true  Transportation Needs: No Transportation Needs (09/18/2021)   PRAPARE - Administrator, Civil Service (Medical): No    Lack of Transportation (Non-Medical): No  Physical Activity: Insufficiently Active (09/18/2021)   Exercise Vital Sign    Days of Exercise per Week: 3 days    Minutes of Exercise per Session: 30 min  Stress: No Stress Concern Present (09/18/2021)   [employer] of Occupational Health - Occupational Stress Questionnaire    Feeling of Stress : Not at all  Social Connections: Socially Integrated (09/18/2021)   Social Connection and Isolation Panel [NHANES]    Frequency of Communication with Friends and Family: More than three times a week    Frequency of Social Gatherings with Friends and Family: Twice a week    Attends Religious Services: More than 4 times per year    Active Member of [employer] or Organizations: Yes    Attends Banker Meetings: More than 4 times per year    Marital Status: Married  Catering manager Violence: Not At Risk (09/18/2021)   Humiliation, Afraid, Rape, and Kick questionnaire    Fear of Current or Ex-Partner: No    Emotionally Abused:  No    Physically Abused: No    Sexually Abused: No    Outpatient Medications Prior to Visit  Medication Sig Dispense Refill   cetirizine (ZYRTEC) 10 MG chewable tablet Chew 10 mg by mouth daily.     valACYclovir (VALTREX) 1000 MG tablet 2 po bid for one day (Patient not taking: Reported on 01/23/2023) 4 tablet 0   albuterol (PROVENTIL) (2.5 MG/3ML) 0.083% nebulizer solution Take 3 mLs (2.5 mg total) by nebulization every 6 (six) hours as needed for wheezing or shortness of breath. 75 mL 3   albuterol (VENTOLIN HFA) 108 (90 Base) MCG/ACT inhaler Inhale 2 puffs into the lungs every 4 (four) hours as needed for wheezing or shortness of breath. 18 g 2   atorvastatin (LIPITOR) 10 MG tablet Take 1 tablet (10 mg total) by mouth daily. 90 tablet 1   fluticasone (FLONASE) 50 MCG/ACT nasal  spray Place 2 sprays into both nostrils daily. 16 g 3   Fluticasone-Umeclidin-Vilant (TRELEGY ELLIPTA) 100-62.5-25 MCG/ACT AEPB Inhale 1 puff into the lungs daily. 1 each 0   metoprolol succinate (TOPROL-XL) 25 MG 24 hr tablet Take 1 tablet (25 mg total) by mouth in the morning and at bedtime. 180 tablet 2   montelukast (SINGULAIR) 10 MG tablet Take 1 tablet (10 mg total) by mouth daily. 90 tablet 1   pantoprazole (PROTONIX) 40 MG tablet Take 1 tablet (40 mg total) by mouth daily. 90 tablet 1   venlafaxine XR (EFFEXOR-XR) 37.5 MG 24 hr capsule TAKE 1 CAPSULE BY MOUTH EVERY DAY 90 capsule 1   Vitamin D, Ergocalciferol, (DRISDOL) 1.25 MG (50000 UNIT) CAPS capsule TAKE 1 CAPSULE BY MOUTH EVERY SEVEN DAYS 12 capsule 1   celecoxib (CELEBREX) 200 MG capsule Take 1 capsule (200 mg total) by mouth daily. 90 capsule 1   ketoconazole (NIZORAL) 2 % cream Apply 1 Application topically 2 (two) times daily. To mouth rash (Patient not taking: Reported on 01/23/2023) 15 g 0   loratadine (CLARITIN) 10 MG tablet Take 1 tablet (10 mg total) by mouth daily. 30 tablet 11   No facility-administered medications prior to visit.    No Known Allergies  Review of Systems  Constitutional:  Negative for chills, fatigue and fever.  HENT:  Negative for congestion, ear pain, postnasal drip, rhinorrhea, sinus pressure, sinus pain and sore throat.   Respiratory:  Negative for cough and shortness of breath.   Cardiovascular:  Negative for chest pain.  Gastrointestinal:  Negative for diarrhea and nausea.  Neurological:  Negative for dizziness and headaches.       Objective:        03/04/2023    8:51 AM 01/23/2023   10:14 AM 11/07/2022    9:53 AM  Vitals with BMI  Height 5\' 0"  5\' 0"  5\' 0"   Weight 172 lbs 165 lbs 169 lbs 6 oz  BMI 33.59 32.22 33.08  Systolic 134 132 161  Diastolic 60 80 64  Pulse 70 83 66    No data found.   Physical Exam  Health Maintenance Due  Topic Date Due   DTaP/Tdap/Td (1 - Tdap) Never  done   Zoster Vaccines- Shingrix (1 of 2) Never done   Medicare Annual Wellness (AWV)  03/09/2023    There are no preventive care reminders to display for this patient.   Lab Results  Component Value Date   TSH 1.520 10/02/2022   Lab Results  Component Value Date   WBC 6.6 10/02/2022   HGB 13.7 10/02/2022  HCT 40.0 10/02/2022   MCV 92 10/02/2022   PLT 182 10/02/2022   Lab Results  Component Value Date   NA 143 10/02/2022   K 4.4 10/02/2022   CO2 23 10/02/2022   GLUCOSE 100 (H) 10/02/2022   BUN 14 10/02/2022   CREATININE 0.67 10/02/2022   BILITOT 0.4 10/02/2022   ALKPHOS 90 10/02/2022   AST 18 10/02/2022   ALT 18 10/02/2022   PROT 6.6 10/02/2022   ALBUMIN 4.2 10/02/2022   CALCIUM 9.1 10/02/2022   EGFR 96 10/02/2022   Lab Results  Component Value Date   CHOL 146 10/02/2022   Lab Results  Component Value Date   HDL 61 10/02/2022   Lab Results  Component Value Date   LDLCALC 68 10/02/2022   Lab Results  Component Value Date   TRIG 89 10/02/2022   Lab Results  Component Value Date   CHOLHDL 2.4 10/02/2022   No results found for: "HGBA1C"     Assessment & Plan:  Mild intermittent asthma with acute exacerbation Assessment & Plan: Sent prednisone 10 mg as directed. Order nebulizer with duoneb. Kenalog 80 mg IM #1 was given at the office.  Orders: -     Ipratropium-Albuterol -     Triamcinolone Acetonide -     predniSONE; Take 6 tablets (60 mg total) by mouth daily with breakfast for 1 day, THEN 5 tablets (50 mg total) daily with breakfast for 1 day, THEN 4 tablets (40 mg total) daily with breakfast for 1 day, THEN 3 tablets (30 mg total) daily with breakfast for 1 day, THEN 2 tablets (20 mg total) daily with breakfast for 1 day, THEN 1 tablet (10 mg total) daily with breakfast for 1 day.  Dispense: 21 tablet; Refill: 0  Acute cough Assessment & Plan: Ordered rapid covid test.  Orders: -     POC COVID-19 BinaxNow     Meds ordered this encounter   Medications   ipratropium-albuterol (DUONEB) 0.5-2.5 (3) MG/3ML nebulizer solution 3 mL   triamcinolone acetonide (KENALOG-40) injection 80 mg   predniSONE (DELTASONE) 10 MG tablet    Sig: Take 6 tablets (60 mg total) by mouth daily with breakfast for 1 day, THEN 5 tablets (50 mg total) daily with breakfast for 1 day, THEN 4 tablets (40 mg total) daily with breakfast for 1 day, THEN 3 tablets (30 mg total) daily with breakfast for 1 day, THEN 2 tablets (20 mg total) daily with breakfast for 1 day, THEN 1 tablet (10 mg total) daily with breakfast for 1 day.    Dispense:  21 tablet    Refill:  0    Orders Placed This Encounter  Procedures   POC COVID-19 BinaxNow     Follow-up: Return if symptoms worsen or fail to improve.  An After Visit Summary was printed and given to the patient.   Clayborn Bigness I Leal-Borjas,acting as a scribe for Blane Ohara, MD.,have documented all relevant documentation on the behalf of Blane Ohara, MD,as directed by  Blane Ohara, MD while in the presence of Blane Ohara, MD.   Blane Ohara, MD [facility] [phone]

## 2023-03-07 ENCOUNTER — Other Ambulatory Visit: Payer: Self-pay | Admitting: Physician Assistant

## 2023-03-07 ENCOUNTER — Telehealth: Payer: Self-pay

## 2023-03-07 MED ORDER — AZITHROMYCIN 250 MG PO TABS: ORAL_TABLET | ORAL | 0 refills | Status: AC

## 2023-03-07 MED ORDER — BENZONATATE 100 MG PO CAPS: 100.0000 mg | ORAL_CAPSULE | Freq: Two times a day (BID) | ORAL | 0 refills | Status: DC | PRN

## 2023-03-07 NOTE — Telephone Encounter (Signed)
Patient Made Aware, Verbalized Understanding. 

## 2023-03-07 NOTE — Telephone Encounter (Signed)
Patient made aware, verbalized understanding. Prednisone was sent in for patient, patient stated she was not giving anything for cough. Patient stated she uses urgent health care pharmacy.

## 2023-03-07 NOTE — Telephone Encounter (Signed)
Will send in tessalon perles and antibiotics - follow up next week if symptoms persist

## 2023-03-07 NOTE — Telephone Encounter (Signed)
Patient called and state that she seen Dr. Sedalia Muta on 03/04/2023, and cough is not getting better and wanted to know if she could get something called in for cough and maybe an order for an x-ray. Please advise

## 2023-03-08 DIAGNOSIS — R051 Acute cough: Secondary | ICD-10-CM | POA: Insufficient documentation

## 2023-03-08 DIAGNOSIS — J4521 Mild intermittent asthma with (acute) exacerbation: Secondary | ICD-10-CM

## 2023-03-08 HISTORY — DX: Mild intermittent asthma with (acute) exacerbation: J45.21

## 2023-03-08 NOTE — Assessment & Plan Note (Addendum)
Sent prednisone 10 mg as directed. Order nebulizer with duoneb. Kenalog 80 mg IM #1 was given at the office.

## 2023-03-08 NOTE — Assessment & Plan Note (Signed)
Ordered rapid covid test 

## 2023-04-04 ENCOUNTER — Ambulatory Visit (INDEPENDENT_AMBULATORY_CARE_PROVIDER_SITE_OTHER): Payer: Medicare HMO | Admitting: Physician Assistant

## 2023-04-04 ENCOUNTER — Encounter: Payer: Self-pay | Admitting: Physician Assistant

## 2023-04-04 VITALS — BP 120/60 | HR 60 | Temp 98.2°F | Resp 14 | Ht 60.0 in | Wt 167.0 lb

## 2023-04-04 DIAGNOSIS — E782 Mixed hyperlipidemia: Secondary | ICD-10-CM

## 2023-04-04 DIAGNOSIS — K219 Gastro-esophageal reflux disease without esophagitis: Secondary | ICD-10-CM | POA: Diagnosis not present

## 2023-04-04 DIAGNOSIS — E559 Vitamin D deficiency, unspecified: Secondary | ICD-10-CM

## 2023-04-04 DIAGNOSIS — I4729 Other ventricular tachycardia: Secondary | ICD-10-CM | POA: Diagnosis not present

## 2023-04-04 DIAGNOSIS — I10 Essential (primary) hypertension: Secondary | ICD-10-CM | POA: Diagnosis not present

## 2023-04-04 DIAGNOSIS — J4520 Mild intermittent asthma, uncomplicated: Secondary | ICD-10-CM | POA: Diagnosis not present

## 2023-04-04 DIAGNOSIS — F321 Major depressive disorder, single episode, moderate: Secondary | ICD-10-CM | POA: Diagnosis not present

## 2023-04-04 DIAGNOSIS — J4521 Mild intermittent asthma with (acute) exacerbation: Secondary | ICD-10-CM

## 2023-04-04 DIAGNOSIS — J060 Acute laryngopharyngitis: Secondary | ICD-10-CM

## 2023-04-04 MED ORDER — VENLAFAXINE HCL ER 37.5 MG PO CP24
ORAL_CAPSULE | ORAL | 1 refills | Status: DC
Start: 2023-04-04 — End: 2023-10-07

## 2023-04-04 MED ORDER — ATORVASTATIN CALCIUM 10 MG PO TABS
10.0000 mg | ORAL_TABLET | Freq: Every day | ORAL | 1 refills | Status: DC
Start: 2023-04-04 — End: 2023-10-07

## 2023-04-04 MED ORDER — PANTOPRAZOLE SODIUM 40 MG PO TBEC
40.0000 mg | DELAYED_RELEASE_TABLET | Freq: Every day | ORAL | 1 refills | Status: DC
Start: 2023-04-04 — End: 2023-08-11

## 2023-04-04 MED ORDER — MONTELUKAST SODIUM 10 MG PO TABS
10.0000 mg | ORAL_TABLET | Freq: Every day | ORAL | 1 refills | Status: DC
Start: 2023-04-04 — End: 2023-10-07

## 2023-04-04 MED ORDER — TRELEGY ELLIPTA 100-62.5-25 MCG/ACT IN AEPB
1.0000 | INHALATION_SPRAY | Freq: Every day | RESPIRATORY_TRACT | 0 refills | Status: DC
Start: 2023-04-04 — End: 2023-10-07

## 2023-04-04 NOTE — Progress Notes (Signed)
Established Patient Office Visit  Subjective:  [name], female    DOB: 05-03-1956  Age: 67 y.o. MRN: [id number]  CC:  Chief Complaint  Patient presents with   Hypertension   Hyperlipidemia    HPI Carolyna Snoddy presents for chronic follow up  Pt is now seeing cardiology and diagnosed with PAT - currently on toprol XL 25mg  bid and that is controlling symptoms well - she has appt with Dr Dulce Sellar on 6/28  Pt with history of anxiety - symptoms are stable on effexor xr 37.5mg  and voices no concerns or problems- requests refill of medication   Pt with history of GERD - symptoms stable on protonix 40mg  qd -   Pt with history of vit D def - is taking weekly supplement - due to repeat labwork  Pt with history of asthma/copd - currently taking trelegy and albuterol - voices no concerns or problems today  Past Medical History:  Diagnosis Date   GERD (gastroesophageal reflux disease)    Major depressive disorder, single episode, moderate (HCC)    Mild intermittent asthma with (acute) exacerbation    Nontoxic single thyroid nodule     Past Surgical History:  Procedure Laterality Date   CESAREAN SECTION  1982 & 1998   X2   THYROIDECTOMY Right     Family History  Problem Relation Age of Onset   Lung cancer Mother    COPD Mother    Heart attack Father    Depression Sister    Suicidality Sister    Lung cancer Brother    COPD Brother    Breast cancer Neg Hx     Social History   Socioeconomic History   Marital status: Married    Spouse name: Not on file   Number of children: 2   Years of education: Not on file   Highest education level: Not on file  Occupational History   Not on file  Tobacco Use   Smoking status: Never   Smokeless tobacco: Never  Vaping Use   Vaping Use: Never used  Substance and Sexual Activity   Alcohol use: Not Currently   Drug use: Never   Sexual activity: Yes    Partners: Male    Birth control/protection: None  Other  Topics Concern   Not on file  Social History Narrative   Not on file   Social Determinants of Health   Financial Resource Strain: Low Risk  (09/18/2021)   Overall Financial Resource Strain (CARDIA)    Difficulty of Paying Living Expenses: Not hard at all  Food Insecurity: No Food Insecurity (09/18/2021)   Hunger Vital Sign    Worried About Running Out of Food in the Last Year: Never true    Ran Out of Food in the Last Year: Never true  Transportation Needs: No Transportation Needs (09/18/2021)   PRAPARE - Administrator, Civil Service (Medical): No    Lack of Transportation (Non-Medical): No  Physical Activity: Insufficiently Active (09/18/2021)   Exercise Vital Sign    Days of Exercise per Week: 3 days    Minutes of Exercise per Session: 30 min  Stress: No Stress Concern Present (09/18/2021)   [employer] of Occupational Health - Occupational Stress Questionnaire    Feeling of Stress : Not at all  Social Connections: Socially Integrated (09/18/2021)   Social Connection and Isolation Panel [NHANES]    Frequency of Communication with Friends and Family: More than three times a week  Frequency of Social Gatherings with Friends and Family: Twice a week    Attends Religious Services: More than 4 times per year    Active Member of [employer] or Organizations: Yes    Attends Engineer, structural: More than 4 times per year    Marital Status: Married  Catering manager Violence: Not At Risk (09/18/2021)   Humiliation, Afraid, Rape, and Kick questionnaire    Fear of Current or Ex-Partner: No    Emotionally Abused: No    Physically Abused: No    Sexually Abused: No     Current Outpatient Medications:    albuterol (PROVENTIL) (2.5 MG/3ML) 0.083% nebulizer solution, Take 3 mLs (2.5 mg total) by nebulization every 6 (six) hours as needed for wheezing or shortness of breath., Disp: 75 mL, Rfl: 3   albuterol (VENTOLIN HFA) 108 (90 Base) MCG/ACT inhaler, Inhale 2  puffs into the lungs every 4 (four) hours as needed for wheezing or shortness of breath., Disp: 18 g, Rfl: 2   cetirizine (ZYRTEC) 10 MG chewable tablet, Chew 10 mg by mouth daily., Disp: , Rfl:    fluticasone (FLONASE) 50 MCG/ACT nasal spray, Place 2 sprays into both nostrils daily., Disp: 16 g, Rfl: 3   metoprolol succinate (TOPROL-XL) 25 MG 24 hr tablet, Take 1 tablet (25 mg total) by mouth in the morning and at bedtime., Disp: 180 tablet, Rfl: 2   Vitamin D, Ergocalciferol, (DRISDOL) 1.25 MG (50000 UNIT) CAPS capsule, TAKE 1 CAPSULE BY MOUTH EVERY SEVEN DAYS, Disp: 12 capsule, Rfl: 1   atorvastatin (LIPITOR) 10 MG tablet, Take 1 tablet (10 mg total) by mouth daily., Disp: 90 tablet, Rfl: 1   Fluticasone-Umeclidin-Vilant (TRELEGY ELLIPTA) 100-62.5-25 MCG/ACT AEPB, Inhale 1 puff into the lungs daily., Disp: 1 each, Rfl: 0   montelukast (SINGULAIR) 10 MG tablet, Take 1 tablet (10 mg total) by mouth daily., Disp: 90 tablet, Rfl: 1   pantoprazole (PROTONIX) 40 MG tablet, Take 1 tablet (40 mg total) by mouth daily., Disp: 90 tablet, Rfl: 1   venlafaxine XR (EFFEXOR-XR) 37.5 MG 24 hr capsule, TAKE 1 CAPSULE BY MOUTH EVERY DAY, Disp: 90 capsule, Rfl: 1   No Known Allergies CONSTITUTIONAL: Negative for chills, fatigue, fever, unintentional weight gain and unintentional weight loss.  E/N/T: Negative for ear pain, nasal congestion and sore throat.  CARDIOVASCULAR: Negative for chest pain, dizziness, palpitations and pedal edema.  RESPIRATORY: Negative for recent cough and dyspnea.  GASTROINTESTINAL: Negative for abdominal pain, acid reflux symptoms, constipation, diarrhea, nausea and vomiting.  MSK: Negative for arthralgias and myalgias.  INTEGUMENTARY: Negative for rash.  NEUROLOGICAL: Negative for dizziness and headaches.  PSYCHIATRIC: Negative for sleep disturbance and to question depression screen.  Negative for depression, negative for anhedonia.        Objective:  PHYSICAL EXAM:   VS: BP  120/60   Pulse 60   Temp 98.2 F (36.8 C)   Resp 14   Ht 5' (1.524 m)   Wt 167 lb (75.8 kg)   LMP  (LMP Unknown)   SpO2 97%   BMI 32.61 kg/m   GEN: Well nourished, well developed, in no acute distress  Cardiac: RRR; no murmurs, rubs, or gallops,no edema - Respiratory:  normal respiratory rate and pattern with no distress - normal breath sounds with no rales, rhonchi, wheezes or rubs MS: no deformity or atrophy  Skin: warm and dry, no rash   Psych: euthymic mood, appropriate affect and demeanor    Health Maintenance Due  Topic Date  Due   DTaP/Tdap/Td (1 - Tdap) Never done   Zoster Vaccines- Shingrix (1 of 2) Never done   Medicare Annual Wellness (AWV)  03/09/2023    There are no preventive care reminders to display for this patient.  Lab Results  Component Value Date   TSH 1.520 10/02/2022   Lab Results  Component Value Date   WBC 6.6 10/02/2022   HGB 13.7 10/02/2022   HCT 40.0 10/02/2022   MCV 92 10/02/2022   PLT 182 10/02/2022   Lab Results  Component Value Date   NA 143 10/02/2022   K 4.4 10/02/2022   CO2 23 10/02/2022   GLUCOSE 100 (H) 10/02/2022   BUN 14 10/02/2022   CREATININE 0.67 10/02/2022   BILITOT 0.4 10/02/2022   ALKPHOS 90 10/02/2022   AST 18 10/02/2022   ALT 18 10/02/2022   PROT 6.6 10/02/2022   ALBUMIN 4.2 10/02/2022   CALCIUM 9.1 10/02/2022   EGFR 96 10/02/2022   Lab Results  Component Value Date   CHOL 146 10/02/2022   Lab Results  Component Value Date   HDL 61 10/02/2022   Lab Results  Component Value Date   LDLCALC 68 10/02/2022   Lab Results  Component Value Date   TRIG 89 10/02/2022   Lab Results  Component Value Date   CHOLHDL 2.4 10/02/2022   No results found for: "HGBA1C"    Assessment & Plan:   Problem List Items Addressed This Visit       Cardiovascular and Mediastinum   NSVT (nonsustained ventricular tachycardia) (HCC) - Primary   Relevant Orders   CBC with Differential/Platelet   Comprehensive  metabolic panel   Lipid panel TSH Continue current med and follow up with cardiology as directed Continue meds     Digestive   GERD (gastroesophageal reflux disease)   Relevant Medications   pantoprazole (PROTONIX) 40 MG tablet        Asthma Continue current meds           Other   Major depressive disorder, single episode, moderate (HCC)   Relevant Medications   venlafaxine XR (EFFEXOR-XR) 37.5 MG 24 hr capsule             Vitamin D insufficiency       Relevant Orders   VITAMIN D 25 Hydroxy (Vit-D Deficiency, Fractures)       Meds ordered this encounter  Medications   atorvastatin (LIPITOR) 10 MG tablet    Sig: Take 1 tablet (10 mg total) by mouth daily.    Dispense:  90 tablet    Refill:  1    Order Specific Question:   Supervising Provider    Answer:   COX, KIRSTEN [[id number]]   montelukast (SINGULAIR) 10 MG tablet    Sig: Take 1 tablet (10 mg total) by mouth daily.    Dispense:  90 tablet    Refill:  1    Order Specific Question:   Supervising Provider    Answer:   Corey Harold   pantoprazole (PROTONIX) 40 MG tablet    Sig: Take 1 tablet (40 mg total) by mouth daily.    Dispense:  90 tablet    Refill:  1    Order Specific Question:   Supervising Provider    Answer:   COX, Aniceto Boss   venlafaxine XR (EFFEXOR-XR) 37.5 MG 24 hr capsule    Sig: TAKE 1 CAPSULE BY MOUTH EVERY DAY    Dispense:  90 capsule    Refill:  1    Order Specific Question:   Supervising Provider    Answer:   Corey Harold   Fluticasone-Umeclidin-Vilant (TRELEGY ELLIPTA) 100-62.5-25 MCG/ACT AEPB    Sig: Inhale 1 puff into the lungs daily.    Dispense:  1 each    Refill:  0    Order Specific Question:   Supervising Provider    AnswerCorey Harold   Follow-up: Return in about 6 months (around 10/04/2023) for chronic fasting follow-up - also schedule MCR well with Selena Batten on phone.    SARA R Tykia Mellone, PA-C

## 2023-04-05 LAB — COMPREHENSIVE METABOLIC PANEL
ALT: 18 IU/L (ref 0–32)
AST: 16 IU/L (ref 0–40)
Albumin/Globulin Ratio: 1.9 (ref 1.2–2.2)
Albumin: 4.4 g/dL (ref 3.9–4.9)
Alkaline Phosphatase: 91 IU/L (ref 44–121)
BUN/Creatinine Ratio: 21 (ref 12–28)
BUN: 13 mg/dL (ref 8–27)
Bilirubin Total: 0.4 mg/dL (ref 0.0–1.2)
CO2: 25 mmol/L (ref 20–29)
Calcium: 9.2 mg/dL (ref 8.7–10.3)
Chloride: 104 mmol/L (ref 96–106)
Creatinine, Ser: 0.62 mg/dL (ref 0.57–1.00)
Globulin, Total: 2.3 g/dL (ref 1.5–4.5)
Glucose: 94 mg/dL (ref 70–99)
Potassium: 4.9 mmol/L (ref 3.5–5.2)
Sodium: 142 mmol/L (ref 134–144)
Total Protein: 6.7 g/dL (ref 6.0–8.5)
eGFR: 97 mL/min/{1.73_m2} (ref >59)

## 2023-04-05 LAB — CBC WITH DIFFERENTIAL/PLATELET
Basophils Absolute: 0 10*3/uL (ref 0.0–0.2)
Basos: 0 %
EOS (ABSOLUTE): 0.1 10*3/uL (ref 0.0–0.4)
Eos: 1 %
Hematocrit: 42.5 % (ref 34.0–46.6)
Hemoglobin: 13.9 g/dL (ref 11.1–15.9)
Immature Grans (Abs): 0 10*3/uL (ref 0.0–0.1)
Immature Granulocytes: 0 %
Lymphocytes Absolute: 3.1 10*3/uL (ref 0.7–3.1)
Lymphs: 38 %
MCH: 30.3 pg (ref 26.6–33.0)
MCHC: 32.7 g/dL (ref 31.5–35.7)
MCV: 93 fL (ref 79–97)
Monocytes Absolute: 0.5 10*3/uL (ref 0.1–0.9)
Monocytes: 6 %
Neutrophils Absolute: 4.3 10*3/uL (ref 1.4–7.0)
Neutrophils: 55 %
Platelets: 247 10*3/uL (ref 150–450)
RBC: 4.58 x10E6/uL (ref 3.77–5.28)
RDW: 12.8 % (ref 11.7–15.4)
WBC: 8 10*3/uL (ref 3.4–10.8)

## 2023-04-05 LAB — LIPID PANEL
Chol/HDL Ratio: 2.6 ratio (ref 0.0–4.4)
Cholesterol, Total: 156 mg/dL (ref 100–199)
HDL: 60 mg/dL (ref >39)
LDL Chol Calc (NIH): 80 mg/dL (ref 0–99)
Triglycerides: 85 mg/dL (ref 0–149)
VLDL Cholesterol Cal: 16 mg/dL (ref 5–40)

## 2023-04-05 LAB — TSH: TSH: 2.83 u[IU]/mL (ref 0.450–4.500)

## 2023-04-05 LAB — VITAMIN D 25 HYDROXY (VIT D DEFICIENCY, FRACTURES): Vit D, 25-Hydroxy: 61.7 ng/mL (ref 30.0–100.0)

## 2023-04-24 ENCOUNTER — Ambulatory Visit (INDEPENDENT_AMBULATORY_CARE_PROVIDER_SITE_OTHER): Payer: Medicare HMO

## 2023-04-24 VITALS — BP 136/72 | HR 60 | Resp 16 | Ht 60.0 in | Wt 167.2 lb

## 2023-04-24 DIAGNOSIS — Z0000 Encounter for general adult medical examination without abnormal findings: Secondary | ICD-10-CM | POA: Diagnosis not present

## 2023-04-24 DIAGNOSIS — Z1231 Encounter for screening mammogram for malignant neoplasm of breast: Secondary | ICD-10-CM

## 2023-04-24 NOTE — Progress Notes (Signed)
Cardiology Office Note:    Date:  04/25/2023   ID:  [name], DOB [birth year], MRN [id number]  PCP:  Marianne Sofia, PA-C  Cardiologist:  Norman Herrlich, MD    Referring MD: Marianne Sofia, PA-C    ASSESSMENT:    1. Paroxysmal SVT (supraventricular tachycardia)   2. PVC (premature ventricular contraction)   3. Primary hypertension   4. Mixed hyperlipidemia    PLAN:    In order of problems listed above:  Doing better with her rapid heart rhythm however she is having symptoms of her beta-blocker I suspect this bit little bit of breathlessness when she walks is blunted heart rate response fatigue and will reduce Toprol to once daily use a small amount of calcium channel blocker to mitigate symptoms Hypertension is well-controlled current medications no indication of heart failure Lipids at target continue her statin Avoid over-the-counter drugs   Next appointment: 6 months   Medication Adjustments/Labs and Tests Ordered: Current medicines are reviewed at length with the patient today.  Concerns regarding medicines are outlined above.  Orders Placed This Encounter  Procedures   EKG 12-Lead   No orders of the defined types were placed in this encounter.    History of Present Illness:    Brittany Short is a 67 y.o. female with a hx of hypertension hyperlipidemia palpitation SVT and PVCs last seen 01/23/2023. Compliance with diet, lifestyle and medications: Yes  Terms of heart rhythm she is quite improved with higher dose of beta-blocker no longer has rapid heartbeat I asked her to consider getting a smart watch either the apple or Fitbit to monitor her rhythm at home with alarm upper rate of 130 bpm She uses over-the-counter Benadryl I reinforced that it is proarrhythmic and she will avoid over-the-counter proarrhythmic drugs She notices for starts walking especially in hot humid weather she finds herself breathless but she walks.  She is not having edema orthopnea chest pain  palpitation or syncope EKG shows nonspecific abnormal R wave progression otherwise normal Recent labs 04/04/2023 cholesterol 156 LDL 80 hemoglobin 13.3 creatinine 0.62 potassium 4.9 Past Medical History:  Diagnosis Date   GERD (gastroesophageal reflux disease)    Major depressive disorder, single episode, moderate (HCC)    Mild intermittent asthma with (acute) exacerbation    Nontoxic single thyroid nodule     Past Surgical History:  Procedure Laterality Date   CESAREAN SECTION  1982 & 1998   X2   THYROIDECTOMY Right     Current Medications: Current Meds  Medication Sig   albuterol (PROVENTIL) (2.5 MG/3ML) 0.083% nebulizer solution Take 3 mLs (2.5 mg total) by nebulization every 6 (six) hours as needed for wheezing or shortness of breath.   albuterol (VENTOLIN HFA) 108 (90 Base) MCG/ACT inhaler Inhale 2 puffs into the lungs every 4 (four) hours as needed for wheezing or shortness of breath.   atorvastatin (LIPITOR) 10 MG tablet Take 1 tablet (10 mg total) by mouth daily.   cetirizine (ZYRTEC) 10 MG chewable tablet Chew 10 mg by mouth daily.   fluticasone (FLONASE) 50 MCG/ACT nasal spray Place 2 sprays into both nostrils daily.   Fluticasone-Umeclidin-Vilant (TRELEGY ELLIPTA) 100-62.5-25 MCG/ACT AEPB Inhale 1 puff into the lungs daily.   metoprolol succinate (TOPROL-XL) 25 MG 24 hr tablet Take 1 tablet (25 mg total) by mouth in the morning and at bedtime.   montelukast (SINGULAIR) 10 MG tablet Take 1 tablet (10 mg total) by mouth daily.   pantoprazole (PROTONIX) 40 MG tablet Take 1 tablet (40  mg total) by mouth daily.   venlafaxine XR (EFFEXOR-XR) 37.5 MG 24 hr capsule TAKE 1 CAPSULE BY MOUTH EVERY DAY (Patient taking differently: Take 37.5 mg by mouth daily with breakfast. TAKE 1 CAPSULE BY MOUTH EVERY DAY)   Vitamin D, Ergocalciferol, (DRISDOL) 1.25 MG (50000 UNIT) CAPS capsule TAKE 1 CAPSULE BY MOUTH EVERY SEVEN DAYS (Patient taking differently: Take 50,000 Units by mouth every 7  (seven) days. TAKE 1 CAPSULE BY MOUTH EVERY SEVEN DAYS)     Allergies:   Patient has no known allergies.   EKGs/Labs/Other Studies Reviewed:    The following studies were reviewed today:  Cardiac Studies & Procedures       ECHOCARDIOGRAM  ECHOCARDIOGRAM COMPLETE 11/15/2022  Narrative ECHOCARDIOGRAM REPORT    Patient Name:   Brittany Short Date of Exam: 11/15/2022 Medical Rec #:  [id number]      Height:       60.0 in Accession #:    [id number]     Weight:       169.4 lb Date of Birth:  Mar 02, 1956       BSA:          1.739 m Patient Age:    67 years       BP:           124/64 mmHg Patient Gender: F              HR:           68 bpm. Exam Location:  [facility]  Procedure: 2D Echo, Cardiac Doppler, Color Doppler and Strain Analysis  Indications:    SOB (shortness of breath) [R06.02 (ICD-10-CM)]  History:        Patient has prior history of Echocardiogram examinations, most recent 09/10/2020. Arrythmias:NSVT (nonsustained ventricular tachycardia), Signs/Symptoms:Chest Pain; Risk Factors:Dyslipidemia and Hypertension.  Sonographer:    Louie Boston RDCS Referring Phys: [id number] KARDIE TOBB  IMPRESSIONS   1. GS -13.1. Left ventricular ejection fraction, by estimation, is 60 to 65%. The left ventricle has normal function. The left ventricle has no regional wall motion abnormalities. Left ventricular diastolic parameters are consistent with Grade II diastolic dysfunction (pseudonormalization). 2. Right ventricular systolic function is normal. The right ventricular size is normal. There is normal pulmonary artery systolic pressure. 3. The mitral valve is normal in structure. Mild mitral valve regurgitation. No evidence of mitral stenosis. 4. The aortic valve is normal in structure. Aortic valve regurgitation is not visualized. No aortic stenosis is present. 5. The inferior vena cava is normal in size with greater than 50% respiratory variability, suggesting right atrial pressure of 3  mmHg.  FINDINGS Left Ventricle: GS -13.1. Left ventricular ejection fraction, by estimation, is 60 to 65%. The left ventricle has normal function. The left ventricle has no regional wall motion abnormalities. The left ventricular internal cavity size was normal in size. There is no left ventricular hypertrophy. Left ventricular diastolic parameters are consistent with Grade II diastolic dysfunction (pseudonormalization).  Right Ventricle: The right ventricular size is normal. No increase in right ventricular wall thickness. Right ventricular systolic function is normal. There is normal pulmonary artery systolic pressure. The tricuspid regurgitant velocity is 2.11 m/s, and with an assumed right atrial pressure of 3 mmHg, the estimated right ventricular systolic pressure is 20.8 mmHg.  Left Atrium: Left atrial size was normal in size.  Right Atrium: Right atrial size was normal in size.  Pericardium: There is no evidence of pericardial effusion.  Mitral Valve: The mitral valve is normal in structure.  Mild mitral valve regurgitation. No evidence of mitral valve stenosis.  Tricuspid Valve: The tricuspid valve is normal in structure. Tricuspid valve regurgitation is trivial. No evidence of tricuspid stenosis.  Aortic Valve: The aortic valve is normal in structure. Aortic valve regurgitation is not visualized. No aortic stenosis is present.  Pulmonic Valve: The pulmonic valve was normal in structure. Pulmonic valve regurgitation is not visualized. No evidence of pulmonic stenosis.  Aorta: The aortic root is normal in size and structure.  Venous: The inferior vena cava is normal in size with greater than 50% respiratory variability, suggesting right atrial pressure of 3 mmHg.  IAS/Shunts: No atrial level shunt detected by color flow Doppler.   LEFT VENTRICLE PLAX 2D LVIDd:         4.10 cm   Diastology LVIDs:         2.90 cm   LV e' medial:    6.31 cm/s LV PW:         1.05 cm   LV E/e'  medial:  15.4 LV IVS:        1.10 cm   LV e' lateral:   6.74 cm/s LVOT diam:     2.00 cm   LV E/e' lateral: 14.4 LV SV:         75 LV SV Index:   43 LVOT Area:     3.14 cm   RIGHT VENTRICLE             IVC RV S prime:     14.10 cm/s  IVC diam: 1.50 cm TAPSE (M-mode): 2.8 cm  LEFT ATRIUM             Index        RIGHT ATRIUM           Index LA diam:        4.10 cm 2.36 cm/m   RA Area:     10.60 cm LA Vol (A2C):   53.6 ml 30.81 ml/m  RA Volume:   19.50 ml  11.21 ml/m LA Vol (A4C):   46.3 ml 26.62 ml/m LA Biplane Vol: 50.7 ml 29.15 ml/m AORTIC VALVE LVOT Vmax:   106.00 cm/s LVOT Vmean:  72.900 cm/s LVOT VTI:    0.238 m  AORTA Ao Root diam: 2.80 cm Ao Asc diam:  3.20 cm Ao Desc diam: 2.10 cm  MITRAL VALVE               TRICUSPID VALVE MV Area (PHT): 4.15 cm    TR Peak grad:   17.8 mmHg MV Decel Time: 183 msec    TR Vmax:        211.00 cm/s MV E velocity: 97.30 cm/s MV A velocity: 63.80 cm/s  SHUNTS MV E/A ratio:  1.53        Systemic VTI:  0.24 m Systemic Diam: 2.00 cm  Gypsy Balsam MD Electronically signed by Gypsy Balsam MD Signature Date/Time: 11/15/2022/3:11:14 PM    Final    MONITORS  LONG TERM MONITOR (3-14 DAYS) 12/09/2022  Narrative Patch Wear Time:  12 days and 5 hours (2024-01-19T14:39:14-0500 to 2024-01-31T20:31:31-0500)  Patient had a min HR of 51 bpm, max HR of 203 bpm, and avg HR of 72 bpm. Predominant underlying rhythm was Sinus Rhythm.  1 run of Ventricular Tachycardia occurred lasting 5 beats with a max rate of 203 bpm (avg 199 bpm). 3 Supraventricular Tachycardia runs occurred, the run with the fastest interval lasting 5 beats with a max rate of 167 bpm,  the longest lasting 12.8 secs with an avg rate of 118 bpm. Some episodes of Supraventricular Tachycardia may be possible Atrial Tachycardia with variable block.  Supraventricular Tachycardia was detected within +/- 45 seconds of symptomatic patient event(s). Isolated SVEs were rare  (<1.0%), SVE Couplets were rare (<1.0%), and SVE Triplets were rare (<1.0%). Isolated VEs were rare (<1.0%), and no VE Couplets or VE Triplets were present. Ventricular Bigeminy and Trigeminy were present.  Symptoms associated with supraventricular tachycardia.  Conclusion: This study is remarkable for the following: 1.  Nonsustained ventricular tachycardia 2.  Symptomatic paroxysmal supraventricular tachycardia.   CT SCANS  CT CORONARY MORPH W/CTA COR W/SCORE 10/26/2020  Addendum 10/26/2020  8:03 AM ADDENDUM REPORT: 10/26/2020 08:01  CLINICAL DATA:  67 Year old White Female  EXAM: Cardiac/Coronary  CTA  TECHNIQUE: The patient was scanned on a [employer].  FINDINGS: A 100 kV prospective scan was triggered in the descending thoracic aorta at 111 HU's. Axial non-contrast 3 mm slices were carried out through the heart. The data set was analyzed on a dedicated work station and scored using the Agatson method. Gantry rotation speed was 250 msecs and collimation was .6 mm. No beta blockade and 0.8 mg of sl NTG was given. The 3D data set was reconstructed in 5% intervals of the 67-82 % of the R-R cycle. Diastolic phases were analyzed on a dedicated work station using MPR, MIP and VRT modes. The patient received 80 cc of contrast.  Aorta:  Normal size.  Aortic atherosclerosis noted.  No dissection.  Aortic Valve:  Tri-leaflet.  No calcifications.  Coronary Arteries:  Normal coronary origin.  Right dominance.  Coronary calcium score of 0. This was 1st percentile for age, sex, and race matched control.  RCA is a large dominant artery that gives rise to PDA and PLA. There is no plaque.  Left main is a large artery that gives rise to LAD and LCX arteries.  LAD is a large vessel that gives rise to one large D1 branch.  LCX is a non-dominant artery that gives rise to one large OM1 branch. There is no plaque.  Other findings:  Normal pulmonary vein drainage into  the left atrium.  Incidental left atrial diverticulum noted.  Small calcification noted between aorta and left atrium.  Normal left atrial appendage without a thrombus.  Normal size of the pulmonary artery.  Extra-cardiac findings: See attached radiology report for non-cardiac structures.  IMPRESSION: 1. Coronary calcium score of 0. This was 1st percentile for age, sex, and race matched control.  2. Normal coronary origin.  Right dominance.  3. There is a disconnect between the mid and distal LAD; this may represent step-wise artifact, but sending for CT-FFR to confirm.  4. CAD-RADS 0.  No evidence of CAD.   Electronically Signed By: Riley Lam MD On: 10/26/2020 08:01  Narrative EXAM: OVER-READ INTERPRETATION  CT CHEST  The following report is an over-read performed by radiologist Dr. Trudie Reed of [facility] on 10/25/2020. This over-read does not include interpretation of cardiac or coronary anatomy or pathology. The coronary calcium score/coronary CTA interpretation by the cardiologist is attached.  COMPARISON:  None.  FINDINGS: Aortic atherosclerosis. Within the visualized portions of the thorax there are no suspicious appearing pulmonary nodules or masses, there is no acute consolidative airspace disease, no pleural effusions, no pneumothorax and no lymphadenopathy. Visualized portions of the upper abdomen are unremarkable. There are no aggressive appearing lytic or blastic lesions noted in the  visualized portions of the skeleton.  IMPRESSION: 1.  Aortic Atherosclerosis (ICD10-I70.0).  Electronically Signed: By: Trudie Reed M.D. On: 10/25/2020 09:55          EKG Interpretation Date/Time:  Friday April 25 2023 08:06:43 EDT Ventricular Rate:  57 PR Interval:  180 QRS Duration:  78 QT Interval:  384 QTC Calculation: 373 R Axis:   -27  Text Interpretation: Sinus bradycardia Abnormal R wave progressiom No previous  ECGs available Confirmed by Norman Herrlich ([id number]) on 04/25/2023 8:24:38 AM   EKG Interpretation Date/Time:  Friday April 25 2023 08:06:43 EDT Ventricular Rate:  57 PR Interval:  180 QRS Duration:  78 QT Interval:  384 QTC Calculation: 373 R Axis:   -27  Text Interpretation: Sinus bradycardia Abnormal R wave progressiom No previous ECGs available Confirmed by Norman Herrlich ([id number]) on 04/25/2023 8:24:38 AM   Recent Labs: 04/04/2023: ALT 18; BUN 13; Creatinine, Ser 0.62; Hemoglobin 13.9; Platelets 247; Potassium 4.9; Sodium 142; TSH 2.830  Recent Lipid Panel    Component Value Date/Time   CHOL 156 04/04/2023 0940   TRIG 85 04/04/2023 0940   HDL 60 04/04/2023 0940   CHOLHDL 2.6 04/04/2023 0940   LDLCALC 80 04/04/2023 0940    Physical Exam:    VS:  BP 122/60 (BP Location: Right Arm, Patient Position: Sitting)   Pulse (!) 59   Ht 5' (1.524 m)   Wt 167 lb (75.8 kg)   LMP  (LMP Unknown)   SpO2 99%   BMI 32.61 kg/m     Wt Readings from Last 3 Encounters:  04/25/23 167 lb (75.8 kg)  04/24/23 167 lb 3.2 oz (75.8 kg)  04/04/23 167 lb (75.8 kg)     GEN:  Well nourished, well developed in no acute distress HEENT: Normal NECK: No JVD; No carotid bruits LYMPHATICS: No lymphadenopathy CARDIAC: RRR, no murmurs, rubs, gallops RESPIRATORY:  Clear to auscultation without rales, wheezing or rhonchi  ABDOMEN: Soft, non-tender, non-distended MUSCULOSKELETAL:  No edema; No deformity  SKIN: Warm and dry NEUROLOGIC:  Alert and oriented x 3 PSYCHIATRIC:  Normal affect    Signed, Norman Herrlich, MD  04/25/2023 8:25 AM    [facility]

## 2023-04-24 NOTE — Patient Instructions (Signed)
Brittany Short , Thank you for taking time to come for your Medicare Wellness Visit. I appreciate your ongoing commitment to your health goals. Please review the following plan we discussed and let me know if I can assist you in the future.    This is a list of the screening recommended for you and due dates:  Health Maintenance  Topic Date Due   DTaP/Tdap/Td vaccine (1 - Tdap) Never done   Zoster (Shingles) Vaccine (1 of 2) Never done   Mammogram  05/28/2023   Flu Shot  05/29/2023   Cologuard (Stool DNA test)  12/26/2023   Medicare Annual Wellness Visit  04/23/2024   Pneumonia Vaccine  Completed   DEXA scan (bone density measurement)  Completed   HPV Vaccine  Aged Out   COVID-19 Vaccine  Discontinued   Hepatitis C Screening  Discontinued   You can get your Tetanus and Shingrix vaccines at the pharmacy.   Preventive Care 64 Years and Older, Female Preventive care refers to lifestyle choices and visits with your health care provider that can promote health and wellness. What does preventive care include? A yearly physical exam. This is also called an annual well check. Dental exams once or twice a year. Routine eye exams. Ask your health care provider how often you should have your eyes checked. Personal lifestyle choices, including: Daily care of your teeth and gums. Regular physical activity. Eating a healthy diet. Avoiding tobacco and drug use. Limiting alcohol use. Practicing safe sex. Taking low-dose aspirin every day. Taking vitamin and mineral supplements as recommended by your health care provider. What happens during an annual well check? The services and screenings done by your health care provider during your annual well check will depend on your age, overall health, lifestyle risk factors, and family history of disease. Counseling  Your health care provider may ask you questions about your: Alcohol use. Tobacco use. Drug use. Emotional well-being. Home and  relationship well-being. Sexual activity. Eating habits. History of falls. Memory and ability to understand (cognition). Work and work Astronomer. Reproductive health. Screening  You may have the following tests or measurements: Height, weight, and BMI. Blood pressure. Lipid and cholesterol levels. These may be checked every 5 years, or more frequently if you are over 63 years old. Skin check. Lung cancer screening. You may have this screening every year starting at age 61 if you have a 30-pack-year history of smoking and currently smoke or have quit within the past 15 years. Fecal occult blood test (FOBT) of the stool. You may have this test every year starting at age 29. Flexible sigmoidoscopy or colonoscopy. You may have a sigmoidoscopy every 5 years or a colonoscopy every 10 years starting at age 4. Hepatitis C blood test. Hepatitis B blood test. Sexually transmitted disease (STD) testing. Diabetes screening. This is done by checking your blood sugar (glucose) after you have not eaten for a while (fasting). You may have this done every 1-3 years. Bone density scan. This is done to screen for osteoporosis. You may have this done starting at age 82. Mammogram. This may be done every 1-2 years. Talk to your health care provider about how often you should have regular mammograms. Talk with your health care provider about your test results, treatment options, and if necessary, the need for more tests. Vaccines  Your health care provider may recommend certain vaccines, such as: Influenza vaccine. This is recommended every year. Tetanus, diphtheria, and acellular pertussis (Tdap, Td) vaccine. You may need  a Td booster every 10 years. Zoster vaccine. You may need this after age 47. Pneumococcal 13-valent conjugate (PCV13) vaccine. One dose is recommended after age 67. Pneumococcal polysaccharide (PPSV23) vaccine. One dose is recommended after age 48. Talk to your health care provider  about which screenings and vaccines you need and how often you need them. This information is not intended to replace advice given to you by your health care provider. Make sure you discuss any questions you have with your health care provider. Document Released: 11/10/2015 Document Revised: 07/03/2016 Document Reviewed: 08/15/2015 Elsevier Interactive Patient Education  2017 [employer].  Fall Prevention in the Home Falls can cause injuries. They can happen to people of all ages. There are many things you can do to make your home safe and to help prevent falls. What can I do on the outside of my home? Regularly fix the edges of walkways and driveways and fix any cracks. Remove anything that might make you trip as you walk through a door, such as a raised step or threshold. Trim any bushes or trees on the path to your home. Use bright outdoor lighting. Clear any walking paths of anything that might make someone trip, such as rocks or tools. Regularly check to see if handrails are loose or broken. Make sure that both sides of any steps have handrails. Any raised decks and porches should have guardrails on the edges. Have any leaves, snow, or ice cleared regularly. Use sand or salt on walking paths during winter. Clean up any spills in your garage right away. This includes oil or grease spills. What can I do in the bathroom? Use night lights. Install grab bars by the toilet and in the tub and shower. Do not use towel bars as grab bars. Use non-skid mats or decals in the tub or shower. If you need to sit down in the shower, use a plastic, non-slip stool. Keep the floor dry. Clean up any water that spills on the floor as soon as it happens. Remove soap buildup in the tub or shower regularly. Attach bath mats securely with double-sided non-slip rug tape. Do not have throw rugs and other things on the floor that can make you trip. What can I do in the bedroom? Use night lights. Make sure  that you have a light by your bed that is easy to reach. Do not use any sheets or blankets that are too big for your bed. They should not hang down onto the floor. Have a firm chair that has side arms. You can use this for support while you get dressed. Do not have throw rugs and other things on the floor that can make you trip. What can I do in the kitchen? Clean up any spills right away. Avoid walking on wet floors. Keep items that you use a lot in easy-to-reach places. If you need to reach something above you, use a strong step stool that has a grab bar. Keep electrical cords out of the way. Do not use floor polish or wax that makes floors slippery. If you must use wax, use non-skid floor wax. Do not have throw rugs and other things on the floor that can make you trip. What can I do with my stairs? Do not leave any items on the stairs. Make sure that there are handrails on both sides of the stairs and use them. Fix handrails that are broken or loose. Make sure that handrails are as long as the stairways. Check  any carpeting to make sure that it is firmly attached to the stairs. Fix any carpet that is loose or worn. Avoid having throw rugs at the top or bottom of the stairs. If you do have throw rugs, attach them to the floor with carpet tape. Make sure that you have a light switch at the top of the stairs and the bottom of the stairs. If you do not have them, ask someone to add them for you. What else can I do to help prevent falls? Wear shoes that: Do not have high heels. Have rubber bottoms. Are comfortable and fit you well. Are closed at the toe. Do not wear sandals. If you use a stepladder: Make sure that it is fully opened. Do not climb a closed stepladder. Make sure that both sides of the stepladder are locked into place. Ask someone to hold it for you, if possible. Clearly mark and make sure that you can see: Any grab bars or handrails. First and last steps. Where the edge of  each step is. Use tools that help you move around (mobility aids) if they are needed. These include: Canes. Walkers. Scooters. Crutches. Turn on the lights when you go into a dark area. Replace any light bulbs as soon as they burn out. Set up your furniture so you have a clear path. Avoid moving your furniture around. If any of your floors are uneven, fix them. If there are any pets around you, be aware of where they are. Review your medicines with your doctor. Some medicines can make you feel dizzy. This can increase your chance of falling. Ask your doctor what other things that you can do to help prevent falls. This information is not intended to replace advice given to you by your health care provider. Make sure you discuss any questions you have with your health care provider. Document Released: 08/10/2009 Document Revised: 03/21/2016 Document Reviewed: 11/18/2014 Elsevier Interactive Patient Education  2017 [employer].

## 2023-04-24 NOTE — Progress Notes (Signed)
Subjective:   Brittany Short is a 67 y.o. female who presents for Medicare Annual (Subsequent) preventive examination.  Visit Complete: In person This wellness visit is conducted by a nurse.  The patient's medications were reviewed and reconciled since the patient's last visit.  History details were provided by the patient.  The history appears to be reliable.    Medical History: Patient history and Family history was reviewed  Medications, Allergies, and preventative health maintenance was reviewed and updated.   Cardiac Risk Factors include: advanced age (>36men, >10 women);dyslipidemia;hypertension;obesity (BMI >30kg/m2)     Objective:    Today's Vitals   04/24/23 0901  BP: 136/72  Pulse: 60  Resp: 16  SpO2: 93%  Weight: 167 lb 3.2 oz (75.8 kg)  Height: 5' (1.524 m)  PainSc: 0-No pain   Body mass index is 32.65 kg/m.     04/24/2023    9:05 AM 03/08/2022    8:11 AM  Advanced Directives  Does Patient Have a Medical Advance Directive? No No  Would patient like information on creating a medical advance directive?  Yes (MAU/Ambulatory/Procedural Areas - Information given)    Current Medications (verified) Outpatient Encounter Medications as of 04/24/2023  Medication Sig   albuterol (PROVENTIL) (2.5 MG/3ML) 0.083% nebulizer solution Take 3 mLs (2.5 mg total) by nebulization every 6 (six) hours as needed for wheezing or shortness of breath.   albuterol (VENTOLIN HFA) 108 (90 Base) MCG/ACT inhaler Inhale 2 puffs into the lungs every 4 (four) hours as needed for wheezing or shortness of breath.   atorvastatin (LIPITOR) 10 MG tablet Take 1 tablet (10 mg total) by mouth daily.   cetirizine (ZYRTEC) 10 MG chewable tablet Chew 10 mg by mouth daily.   fluticasone (FLONASE) 50 MCG/ACT nasal spray Place 2 sprays into both nostrils daily.   Fluticasone-Umeclidin-Vilant (TRELEGY ELLIPTA) 100-62.5-25 MCG/ACT AEPB Inhale 1 puff into the lungs daily.   metoprolol succinate (TOPROL-XL) 25  MG 24 hr tablet Take 1 tablet (25 mg total) by mouth in the morning and at bedtime.   montelukast (SINGULAIR) 10 MG tablet Take 1 tablet (10 mg total) by mouth daily.   pantoprazole (PROTONIX) 40 MG tablet Take 1 tablet (40 mg total) by mouth daily.   venlafaxine XR (EFFEXOR-XR) 37.5 MG 24 hr capsule TAKE 1 CAPSULE BY MOUTH EVERY DAY   Vitamin D, Ergocalciferol, (DRISDOL) 1.25 MG (50000 UNIT) CAPS capsule TAKE 1 CAPSULE BY MOUTH EVERY SEVEN DAYS   No facility-administered encounter medications on file as of 04/24/2023.    Allergies (verified) Patient has no known allergies.   History: Past Medical History:  Diagnosis Date   GERD (gastroesophageal reflux disease)    Major depressive disorder, single episode, moderate (HCC)    Mild intermittent asthma with (acute) exacerbation    Nontoxic single thyroid nodule    Past Surgical History:  Procedure Laterality Date   CESAREAN SECTION  1982 & 1998   X2   THYROIDECTOMY Right    Family History  Problem Relation   Lung cancer Mother   COPD Mother   Heart attack Father   Depression Sister   Suicidality Sister   Lung cancer Brother   COPD Brother   Breast cancer Neg Hx   Social History   Socioeconomic History   Marital status: Married    Spouse name: Raiford Noble   Number of children: 2   Years of education: Not on file   Highest education level: Not on file  Occupational History   Not on  file  Tobacco Use   Smoking status: Never   Smokeless tobacco: Never  Vaping Use   Vaping Use: Never used  Substance and Sexual Activity   Alcohol use: Not Currently   Drug use: Never   Sexual activity: Yes    Partners: Male    Birth control/protection: None  Other Topics Concern   Not on file  Social History Narrative   Not on file   Social Determinants of Health   Financial Resource Strain: Low Risk  (04/24/2023)   Overall Financial Resource Strain (CARDIA)    Difficulty of Paying Living Expenses: Not hard at all  Food Insecurity: No  Food Insecurity (04/24/2023)   Hunger Vital Sign    Worried About Running Out of Food in the Last Year: Never true    Ran Out of Food in the Last Year: Never true  Transportation Needs: No Transportation Needs (04/24/2023)   PRAPARE - Administrator, Civil Service (Medical): No    Lack of Transportation (Non-Medical): No  Physical Activity: Insufficiently Active (04/24/2023)   Exercise Vital Sign    Days of Exercise per Week: 3 days    Minutes of Exercise per Session: 30 min  Stress: No Stress Concern Present (04/24/2023)   [employer] of Occupational Health - Occupational Stress Questionnaire    Feeling of Stress : Not at all  Social Connections: Socially Integrated (04/24/2023)   Social Connection and Isolation Panel [NHANES]    Frequency of Communication with Friends and Family: More than three times a week    Frequency of Social Gatherings with Friends and Family: Twice a week    Attends Religious Services: More than 4 times per year    Active Member of [employer] or Organizations: Yes    Attends Engineer, structural: More than 4 times per year    Marital Status: Married    Tobacco Counseling Counseling given: Not Answered   Clinical Intake:  Pre-visit preparation completed: Yes Pain : No/denies pain Pain Score: 0-No pain   BMI - recorded: 32.65 Nutritional Risks: None Diabetes: No How often do you need to have someone help you when you read instructions, pamphlets, or other written materials from your doctor or pharmacy?: 1 - Never Interpreter Needed?: No      Activities of Daily Living    04/24/2023    9:05 AM 03/04/2023    8:56 AM  In your present state of health, do you have any difficulty performing the following activities:  Hearing? 0 0  Vision? 0 0  Difficulty concentrating or making decisions? 0 0  Walking or climbing stairs? 0 0  Dressing or bathing? 0 0  Doing errands, shopping? 0 0  Preparing Food and eating ? N   Using the Toilet?  N   In the past six months, have you accidently leaked urine? N   Do you have problems with loss of bowel control? N   Managing your Medications? N   Managing your Finances? N   Housekeeping or managing your Housekeeping? N     Patient Care Team: Marianne Sofia, PA-C as PCP - General (Physician Assistant) Thomasene Ripple, DO as PCP - Cardiology ([facility], Iline Oven, MD as Consulting Physician (Cardiology)     Assessment:   This is a routine wellness examination for Dewana.  Hearing/Vision screen No results found.  Depression Screen    04/24/2023    9:03 AM 04/04/2023    9:11 AM 03/04/2023  8:56 AM 10/01/2022    8:45 AM 03/08/2022    8:12 AM 02/20/2022    1:46 PM 12/11/2021   10:59 AM  PHQ 2/9 Scores  PHQ - 2 Score 0 0 0 0 0 0 0  PHQ- 9 Score 0 0 3 1   0    Fall Risk    04/24/2023    8:57 AM 04/04/2023    9:11 AM 03/04/2023    8:54 AM 03/08/2022    7:40 AM 02/20/2022    1:46 PM  Fall Risk   Falls in the past year? 0 0 0 1 0  Number falls in past yr: 0 0 0 0 0  Injury with Fall? 0 0 0 1 0  Risk for fall due to : No Fall Risks No Fall Risks     Follow up Falls evaluation completed;Education provided Falls prevention discussed Falls evaluation completed Education provided     MEDICARE RISK AT HOME:  Medicare Risk at Home - 04/24/23 0857     Any stairs in or around the home? No    If so, are there any without handrails? No    Home free of loose throw rugs in walkways, pet beds, electrical cords, etc? Yes    Adequate lighting in your home to reduce risk of falls? Yes    Life alert? No    Use of a cane, walker or w/c? No    Grab bars in the bathroom? No    Shower chair or bench in shower? No    Elevated toilet seat or a handicapped toilet? No             TIMED UP AND GO:  Was the test performed?  Yes  Length of time to ambulate 10 feet: 5 sec Gait steady and fast without use of assistive device    Cognitive Function:        04/24/2023     9:06 AM 03/08/2022    8:13 AM 09/18/2021   10:01 AM  6CIT Screen  What Year? 0 points 0 points 0 points  What month? 0 points 0 points 0 points  What time? 0 points 0 points 0 points  Count back from 20 0 points 0 points 0 points  Months in reverse 0 points 0 points 0 points  Repeat phrase 0 points 0 points 0 points  Total Score 0 points 0 points 0 points    Immunizations Immunization History  Administered Date(s) Administered   Fluad Quad(high Dose 65+) 08/02/2020, 08/17/2021, 10/01/2022   Influenza-Unspecified 07/28/2018, 08/31/2019   PNEUMOCOCCAL CONJUGATE-20 08/17/2021   Pneumococcal Conjugate-13 08/02/2020    TDAP status: Due, Education has been provided regarding the importance of this vaccine. Advised may receive this vaccine at local pharmacy or Health Dept. Aware to provide a copy of the vaccination record if obtained from local pharmacy or Health Dept. Verbalized acceptance and understanding.  Flu Vaccine status: Up to date  Pneumococcal vaccine status: Up to date  Covid-19 vaccine status: Declined, Education has been provided regarding the importance of this vaccine but patient still declined. Advised may receive this vaccine at local pharmacy or Health Dept.or vaccine clinic. Aware to provide a copy of the vaccination record if obtained from local pharmacy or Health Dept. Verbalized acceptance and understanding.  Qualifies for Shingles Vaccine? Yes   Zostavax completed No   Shingrix Completed?: No.    Education has been provided regarding the importance of this vaccine. Patient has been advised to call insurance company  to determine out of pocket expense if they have not yet received this vaccine. Advised may also receive vaccine at local pharmacy or Health Dept. Verbalized acceptance and understanding.  Screening Tests Health Maintenance  Topic Date Due   DTaP/Tdap/Td (1 - Tdap) Never done   Zoster Vaccines- Shingrix (1 of 2) Never done   Medicare Annual  Wellness (AWV)  03/09/2023   MAMMOGRAM  05/28/2023   INFLUENZA VACCINE  05/29/2023   Fecal DNA (Cologuard)  12/26/2023   Pneumonia Vaccine 41+ Years old  Completed   DEXA SCAN  Completed   HPV VACCINES  Aged Out   COVID-19 Vaccine  Discontinued   Hepatitis C Screening  Discontinued    Health Maintenance  Health Maintenance Due  Topic Date Due   DTaP/Tdap/Td (1 - Tdap) Never done   Zoster Vaccines- Shingrix (1 of 2) Never done   Medicare Annual Wellness (AWV)  03/09/2023    Colorectal cancer screening: Type of screening: Cologuard. Completed 11/2020. Repeat every 3 years  Mammogram status: Completed 04/2022. Repeat every year  Bone Density status: Completed 04/2022. Results reflect: Bone density results: OSTEOPENIA. Repeat every 2 years.  Lung Cancer Screening: (Low Dose CT Chest recommended if Age 51-80 years, 20 pack-year currently smoking OR have quit w/in 15years.) does not qualify.   Lung Cancer Screening Referral: N/A  Additional Screening:  Vision Screening: Recommended annual ophthalmology exams for early detection of glaucoma and other disorders of the eye. Is the patient up to date with their annual eye exam?  No  Who is the provider or what is the name of the office in which the patient attends annual eye exams? [facility]  Dental Screening: Recommended annual dental exams for proper oral hygiene  Community Resource Referral / Chronic Care Management: CRR required this visit?  No   CCM required this visit?  No     Plan:    1- Mammogram ordered - due after 05/28/23 2- Counseling was provided today regarding the following topics: healthy eating habits, home safety, vitamin and mineral supplementation (calcium and Vit D), regular exercise, breast self-exam, tobacco avoidance, limitation of alcohol intake, use of seat belts, firearm safety, and fall prevention. 3- Tetanus and Shingles vaccine discussed - patient will get at the pharmacy  Annual recommendations  include: influenza vaccine, dental cleanings, and eye exams.   I have personally reviewed and noted the following in the patient's chart:   Medical and social history Use of alcohol, tobacco or illicit drugs  Current medications and supplements including opioid prescriptions. Patient is not currently taking opioid prescriptions. Functional ability and status Nutritional status Physical activity Advanced directives List of other physicians Hospitalizations, surgeries, and ER visits in previous 12 months Vitals Screenings to include cognitive, depression, and falls Referrals and appointments  In addition, I have reviewed and discussed with patient certain preventive protocols, quality metrics, and best practice recommendations. A written personalized care plan for preventive services as well as general preventive health recommendations were provided to patient.     Jacklynn Bue, LPN   01/02/6577

## 2023-04-25 ENCOUNTER — Encounter: Payer: Self-pay | Admitting: Cardiology

## 2023-04-25 ENCOUNTER — Ambulatory Visit: Payer: Medicare HMO | Attending: Cardiology | Admitting: Cardiology

## 2023-04-25 VITALS — BP 122/60 | HR 59 | Ht 60.0 in | Wt 167.0 lb

## 2023-04-25 DIAGNOSIS — I471 Supraventricular tachycardia, unspecified: Secondary | ICD-10-CM

## 2023-04-25 DIAGNOSIS — I10 Essential (primary) hypertension: Secondary | ICD-10-CM

## 2023-04-25 DIAGNOSIS — I493 Ventricular premature depolarization: Secondary | ICD-10-CM | POA: Diagnosis not present

## 2023-04-25 DIAGNOSIS — E782 Mixed hyperlipidemia: Secondary | ICD-10-CM | POA: Diagnosis not present

## 2023-04-25 MED ORDER — METOPROLOL SUCCINATE ER 25 MG PO TB24: 25.0000 mg | ORAL_TABLET | Freq: Every day | ORAL | 3 refills | Status: DC

## 2023-04-25 MED ORDER — DILTIAZEM HCL ER COATED BEADS 120 MG PO CP24: 120.0000 mg | ORAL_CAPSULE | Freq: Every day | ORAL | 3 refills | Status: DC

## 2023-04-25 NOTE — Patient Instructions (Addendum)
Medication Instructions:  Your physician has recommended you make the following change in your medication:   START: Toprol XL 25 mg daily START: Cardizem CD 120 mg daily  *If you need a refill on your cardiac medications before your next appointment, please call your pharmacy*   Lab Work: None If you have labs (blood work) drawn today and your tests are completely normal, you will receive your results only by: MyChart Message (if you have MyChart) OR A paper copy in the mail If you have any lab test that is abnormal or we need to change your treatment, we will call you to review the results.   Testing/Procedures: None   Follow-Up: At [facility], you and your health needs are our priority.  As part of our continuing mission to provide you with exceptional heart care, we have created designated Provider Care Teams.  These Care Teams include your primary Cardiologist (physician) and Advanced Practice Providers (APPs -  Physician Assistants and Nurse Practitioners) who all work together to provide you with the care you need, when you need it.  We recommend signing up for the patient portal called "MyChart".  Sign up information is provided on this After Visit Summary.  MyChart is used to connect with patients for Virtual Visits (Telemedicine).  Patients are able to view lab/test results, encounter notes, upcoming appointments, etc.  Non-urgent messages can be sent to your provider as well.   To learn more about what you can do with MyChart, go to [email or web address].    Your next appointment:   6 month(s)  Provider:   Norman Herrlich, MD    Other Instructions 4-7-8 breathing pattern to sleep  1. Avoid all over-the-counter antihistamines except Claritin/Loratadine and Zyrtec/Cetrizine. 2. Avoid all combination including cold sinus allergies flu decongestant and sleep medications 3. You can use Robitussin DM Mucinex and Mucinex DM for cough. 4. can use Tylenol aspirin  ibuprofen and naproxen but no combinations such as sleep or sinus.

## 2023-04-25 NOTE — Addendum Note (Signed)
Addended by: Roxanne Mins I on: 04/25/2023 08:39 AM   Modules accepted: Orders

## 2023-04-30 ENCOUNTER — Telehealth: Payer: Self-pay | Admitting: Physician Assistant

## 2023-04-30 NOTE — Telephone Encounter (Signed)
   Brittany Short has been scheduled for the following appointment:  WHAT: MAMMOGRAM WHERE: [facility] OUTPATIENT DATE: 05/29/23 TIME: 9:30 AM CHECK-IN  Patient has been made aware.

## 2023-05-29 DIAGNOSIS — Z1231 Encounter for screening mammogram for malignant neoplasm of breast: Secondary | ICD-10-CM | POA: Diagnosis not present

## 2023-06-02 ENCOUNTER — Encounter: Payer: Self-pay | Admitting: Physician Assistant

## 2023-06-03 ENCOUNTER — Other Ambulatory Visit: Payer: Self-pay | Admitting: Physician Assistant

## 2023-06-03 DIAGNOSIS — E559 Vitamin D deficiency, unspecified: Secondary | ICD-10-CM

## 2023-08-11 ENCOUNTER — Ambulatory Visit (INDEPENDENT_AMBULATORY_CARE_PROVIDER_SITE_OTHER): Payer: Medicare HMO | Admitting: Physician Assistant

## 2023-08-11 ENCOUNTER — Encounter: Payer: Self-pay | Admitting: Physician Assistant

## 2023-08-11 VITALS — BP 130/64 | HR 61 | Temp 97.8°F | Ht 60.0 in | Wt 172.6 lb

## 2023-08-11 DIAGNOSIS — M25562 Pain in left knee: Secondary | ICD-10-CM | POA: Diagnosis not present

## 2023-08-11 DIAGNOSIS — Z23 Encounter for immunization: Secondary | ICD-10-CM | POA: Diagnosis not present

## 2023-08-11 DIAGNOSIS — K219 Gastro-esophageal reflux disease without esophagitis: Secondary | ICD-10-CM | POA: Diagnosis not present

## 2023-08-11 MED ORDER — CELECOXIB 200 MG PO CAPS
200.0000 mg | ORAL_CAPSULE | Freq: Every day | ORAL | 3 refills | Status: DC
Start: 2023-08-11 — End: 2024-01-14

## 2023-08-11 MED ORDER — PANTOPRAZOLE SODIUM 40 MG PO TBEC
40.0000 mg | DELAYED_RELEASE_TABLET | Freq: Every day | ORAL | 3 refills | Status: DC
Start: 2023-08-11 — End: 2024-01-14

## 2023-08-11 NOTE — Progress Notes (Signed)
Acute Office Visit  Subjective:    [name], female    DOB: 1956-10-17, 67 y.o.   MRN: [id number]  Chief Complaint  Patient presents with   Knee Pain    Left    HPI: Patient is in today for complaints of left knee pain - she states she cannot recall any injury or trauma.  States the pain started about 2 weeks ago.  She did run out of celebrex and requests refill Says knee more tender with bending and prolonged walking  Pt would like flu shot today   Current Outpatient Medications:    albuterol (PROVENTIL) (2.5 MG/3ML) 0.083% nebulizer solution, Take 3 mLs (2.5 mg total) by nebulization every 6 (six) hours as needed for wheezing or shortness of breath., Disp: 75 mL, Rfl: 3   albuterol (VENTOLIN HFA) 108 (90 Base) MCG/ACT inhaler, Inhale 2 puffs into the lungs every 4 (four) hours as needed for wheezing or shortness of breath., Disp: 18 g, Rfl: 2   atorvastatin (LIPITOR) 10 MG tablet, Take 1 tablet (10 mg total) by mouth daily., Disp: 90 tablet, Rfl: 1   cetirizine (ZYRTEC) 10 MG chewable tablet, Chew 10 mg by mouth daily., Disp: , Rfl:    diltiazem (CARDIZEM CD) 120 MG 24 hr capsule, Take 1 capsule (120 mg total) by mouth daily., Disp: 90 capsule, Rfl: 3   fluticasone (FLONASE) 50 MCG/ACT nasal spray, Place 2 sprays into both nostrils daily., Disp: 16 g, Rfl: 3   Fluticasone-Umeclidin-Vilant (TRELEGY ELLIPTA) 100-62.5-25 MCG/ACT AEPB, Inhale 1 puff into the lungs daily., Disp: 1 each, Rfl: 0   metoprolol succinate (TOPROL XL) 25 MG 24 hr tablet, Take 1 tablet (25 mg total) by mouth daily., Disp: 90 tablet, Rfl: 3   montelukast (SINGULAIR) 10 MG tablet, Take 1 tablet (10 mg total) by mouth daily., Disp: 90 tablet, Rfl: 1   venlafaxine XR (EFFEXOR-XR) 37.5 MG 24 hr capsule, TAKE 1 CAPSULE BY MOUTH EVERY DAY (Patient taking differently: Take 37.5 mg by mouth daily with breakfast. TAKE 1 CAPSULE BY MOUTH EVERY DAY), Disp: 90 capsule, Rfl: 1   Vitamin D, Ergocalciferol,  50000 units CAPS, TAKE 1 CAPSULE BY MOUTH EVERY SEVEN DAYS., Disp: 12 capsule, Rfl: 1   celecoxib (CELEBREX) 200 MG capsule, Take 1 capsule (200 mg total) by mouth daily., Disp: 90 capsule, Rfl: 3   pantoprazole (PROTONIX) 40 MG tablet, Take 1 tablet (40 mg total) by mouth daily., Disp: 90 tablet, Rfl: 3  No Known Allergies  ROS CONSTITUTIONAL: Negative for chills, fatigue, fever,   CARDIOVASCULAR: Negative for chest pain, dizziness, palpitations and pedal edema.  RESPIRATORY: Negative for recent cough and dyspnea.   MSK: see HPI INTEGUMENTARY: Negative for rash.       Objective:    PHYSICAL EXAM:   BP 130/64 (BP Location: Left Arm, Patient Position: Sitting, Cuff Size: Large)   Pulse 61   Temp 97.8 F (36.6 C) (Temporal)   Ht 5' (1.524 m)   Wt 172 lb 9.6 oz (78.3 kg)   LMP  (LMP Unknown)   SpO2 96%   BMI 33.71 kg/m    GEN: Well nourished, well developed, in no acute distress  Cardiac: RRR; no murmurs, rubs, or gallops,no edema - n Respiratory:  normal respiratory rate and pattern with no distress - normal breath sounds with no rales, rhonchi, wheezes or rubs  MS: no deformity or atrophy - mildly tender to palpation Skin: warm and dry, no rash  Neuro:  Alert and  Oriented x 3,       Assessment & Plan:     Acute pain of left knee -     Celecoxib; Take 1 capsule (200 mg total) by mouth daily.  Dispense: 90 capsule; Refill: 3  Needs flu shot -     Flu Vaccine Trivalent High Dose (Fluad)  Gastroesophageal reflux disease without esophagitis -     Pantoprazole Sodium; Take 1 tablet (40 mg total) by mouth daily.  Dispense: 90 tablet; Refill: 3     Follow-up: Return if symptoms worsen or fail to improve.  An After Visit Summary was printed and given to the patient.  Jettie Pagan [facility] [phone]

## 2023-09-12 ENCOUNTER — Other Ambulatory Visit: Payer: Self-pay | Admitting: Physician Assistant

## 2023-09-17 ENCOUNTER — Ambulatory Visit: Payer: Medicare HMO | Admitting: Physician Assistant

## 2023-09-17 ENCOUNTER — Encounter: Payer: Self-pay | Admitting: Physician Assistant

## 2023-09-17 ENCOUNTER — Telehealth: Payer: Self-pay

## 2023-09-17 VITALS — BP 120/68 | HR 72 | Temp 97.8°F | Ht 60.0 in | Wt 170.8 lb

## 2023-09-17 DIAGNOSIS — J40 Bronchitis, not specified as acute or chronic: Secondary | ICD-10-CM

## 2023-09-17 HISTORY — DX: Bronchitis, not specified as acute or chronic: J40

## 2023-09-17 MED ORDER — AMOXICILLIN 875 MG PO TABS: 875.0000 mg | ORAL_TABLET | Freq: Two times a day (BID) | ORAL | 0 refills | Status: AC

## 2023-09-17 MED ORDER — PREDNISONE 20 MG PO TABS: ORAL_TABLET | ORAL | 0 refills | Status: DC

## 2023-09-17 MED ORDER — HYDROCODONE BIT-HOMATROP MBR 5-1.5 MG/5ML PO SOLN: 5.0000 mL | Freq: Four times a day (QID) | ORAL | 0 refills | Status: DC | PRN

## 2023-09-17 NOTE — Progress Notes (Signed)
Acute Office Visit  Subjective:    [name], female    DOB: 08-30-1956, 68 y.o.   MRN: [id number]  Chief Complaint  Patient presents with   Hoarse from Coughing    HPI: Patient is in today for complaints of cough, cold and congestion for the past several days.  Has had sore throat, drainage and no fever.  Has been using her inhalers and nebulizer    Current Outpatient Medications:    albuterol (PROVENTIL) (2.5 MG/3ML) 0.083% nebulizer solution, INHALE ONE VIAL VIA NEBULIZER EVERY 6 HOURS AS NEEDED FOR WHEEZING AND SHORTNESS OF BREATH, Disp: 75 mL, Rfl: 3   albuterol (VENTOLIN HFA) 108 (90 Base) MCG/ACT inhaler, Inhale 2 puffs into the lungs every 4 (four) hours as needed for wheezing or shortness of breath., Disp: 18 g, Rfl: 2   amoxicillin (AMOXIL) 875 MG tablet, Take 1 tablet (875 mg total) by mouth 2 (two) times daily for 10 days., Disp: 20 tablet, Rfl: 0   atorvastatin (LIPITOR) 10 MG tablet, Take 1 tablet (10 mg total) by mouth daily., Disp: 90 tablet, Rfl: 1   celecoxib (CELEBREX) 200 MG capsule, Take 1 capsule (200 mg total) by mouth daily., Disp: 90 capsule, Rfl: 3   cetirizine (ZYRTEC) 10 MG chewable tablet, Chew 10 mg by mouth daily., Disp: , Rfl:    diltiazem (CARDIZEM CD) 120 MG 24 hr capsule, Take 1 capsule (120 mg total) by mouth daily., Disp: 90 capsule, Rfl: 3   fluticasone (FLONASE) 50 MCG/ACT nasal spray, Place 2 sprays into both nostrils daily., Disp: 16 g, Rfl: 3   Fluticasone-Umeclidin-Vilant (TRELEGY ELLIPTA) 100-62.5-25 MCG/ACT AEPB, Inhale 1 puff into the lungs daily., Disp: 1 each, Rfl: 0   HYDROcodone bit-homatropine (HYDROMET) 5-1.5 MG/5ML syrup, Take 5 mLs by mouth every 6 (six) hours as needed., Disp: 120 mL, Rfl: 0   metoprolol succinate (TOPROL XL) 25 MG 24 hr tablet, Take 1 tablet (25 mg total) by mouth daily., Disp: 90 tablet, Rfl: 3   montelukast (SINGULAIR) 10 MG tablet, Take 1 tablet (10 mg total) by mouth daily., Disp: 90 tablet,  Rfl: 1   pantoprazole (PROTONIX) 40 MG tablet, Take 1 tablet (40 mg total) by mouth daily., Disp: 90 tablet, Rfl: 3   predniSONE (DELTASONE) 20 MG tablet, 1 po tid for 3 days then 1 po bid for 3 days then 1 po qd for 3 days, Disp: 18 tablet, Rfl: 0   venlafaxine XR (EFFEXOR-XR) 37.5 MG 24 hr capsule, TAKE 1 CAPSULE BY MOUTH EVERY DAY (Patient taking differently: Take 37.5 mg by mouth daily with breakfast. TAKE 1 CAPSULE BY MOUTH EVERY DAY), Disp: 90 capsule, Rfl: 1   Vitamin D, Ergocalciferol, 50000 units CAPS, TAKE 1 CAPSULE BY MOUTH EVERY SEVEN DAYS., Disp: 12 capsule, Rfl: 1  No Known Allergies  ROS CONSTITUTIONAL: Negative for chills, fatigue, fever,  E/N/T: see HPI CARDIOVASCULAR: Negative for chest pain, dizziness, palpitations and pedal edema.  RESPIRATORY: see HPI GASTROINTESTINAL: Negative for abdominal pain, acid reflux symptoms, constipation, diarrhea, nausea and vomiting.      Objective:    PHYSICAL EXAM:   BP 120/68 (BP Location: Left Arm, Patient Position: Sitting)   Pulse 72   Temp 97.8 F (36.6 C) (Temporal)   Ht 5' (1.524 m)   Wt 170 lb 12.8 oz (77.5 kg)   LMP  (LMP Unknown)   SpO2 93%   BMI 33.36 kg/m    GEN: Well nourished, well developed, in no acute distress  HEENT: normal external ears and nose - normal external auditory canals and TMS -  - Lips, Teeth and Gums - normal  Oropharynx -erythema/pnd  Cardiac: RRR; no murmurs, rubs, or gallops,no edema - Respiratory:  scattered wheezes noted  Psych: euthymic mood, appropriate affect and demeanor     Assessment & Plan:    Bronchitis -     Amoxicillin; Take 1 tablet (875 mg total) by mouth 2 (two) times daily for 10 days.  Dispense: 20 tablet; Refill: 0 -     HYDROcodone Bit-Homatrop MBr; Take 5 mLs by mouth every 6 (six) hours as needed.  Dispense: 120 mL; Refill: 0 -     predniSONE; 1 po tid for 3 days then 1 po bid for 3 days then 1 po qd for 3 days  Dispense: 18 tablet; Refill: 0     Follow-up:  Return if symptoms worsen or fail to improve.  An After Visit Summary was printed and given to the patient.  Jettie Pagan [facility] [phone]

## 2023-09-17 NOTE — Telephone Encounter (Signed)
Left message for patient to call and let us know which pharmacy she wants the rx sent.

## 2023-09-17 NOTE — Telephone Encounter (Signed)
Copied from CRM [id number]. Topic: Clinical - Prescription Issue >> Sep 17, 2023  3:29 PM Alcus Dad H wrote: Reason for CRM: Pt stated the pharmacy did not have the medication HYDROcodone bit-homatropine (HYDROMET) 5-1.5 MG/5ML syrup and she needs a new prescription to get it from different pharmacy

## 2023-09-18 ENCOUNTER — Other Ambulatory Visit: Payer: Self-pay | Admitting: Physician Assistant

## 2023-09-18 ENCOUNTER — Encounter: Payer: Self-pay | Admitting: Physician Assistant

## 2023-09-18 DIAGNOSIS — J40 Bronchitis, not specified as acute or chronic: Secondary | ICD-10-CM

## 2023-09-18 MED ORDER — HYDROCODONE BIT-HOMATROP MBR 5-1.5 MG/5ML PO SOLN: 5.0000 mL | Freq: Four times a day (QID) | ORAL | 0 refills | Status: DC | PRN

## 2023-10-07 ENCOUNTER — Encounter: Payer: Self-pay | Admitting: Physician Assistant

## 2023-10-07 ENCOUNTER — Ambulatory Visit (INDEPENDENT_AMBULATORY_CARE_PROVIDER_SITE_OTHER): Payer: Medicare HMO | Admitting: Physician Assistant

## 2023-10-07 VITALS — BP 116/62 | HR 62 | Temp 97.8°F | Ht 60.0 in | Wt 171.0 lb

## 2023-10-07 DIAGNOSIS — K219 Gastro-esophageal reflux disease without esophagitis: Secondary | ICD-10-CM

## 2023-10-07 DIAGNOSIS — I10 Essential (primary) hypertension: Secondary | ICD-10-CM | POA: Diagnosis not present

## 2023-10-07 DIAGNOSIS — E782 Mixed hyperlipidemia: Secondary | ICD-10-CM | POA: Diagnosis not present

## 2023-10-07 DIAGNOSIS — I4729 Other ventricular tachycardia: Secondary | ICD-10-CM | POA: Diagnosis not present

## 2023-10-07 DIAGNOSIS — F321 Major depressive disorder, single episode, moderate: Secondary | ICD-10-CM | POA: Diagnosis not present

## 2023-10-07 DIAGNOSIS — E559 Vitamin D deficiency, unspecified: Secondary | ICD-10-CM | POA: Diagnosis not present

## 2023-10-07 DIAGNOSIS — J060 Acute laryngopharyngitis: Secondary | ICD-10-CM

## 2023-10-07 DIAGNOSIS — J4520 Mild intermittent asthma, uncomplicated: Secondary | ICD-10-CM

## 2023-10-07 MED ORDER — VENLAFAXINE HCL ER 37.5 MG PO CP24: ORAL_CAPSULE | ORAL | 1 refills | Status: DC

## 2023-10-07 MED ORDER — METOPROLOL SUCCINATE ER 25 MG PO TB24: 25.0000 mg | ORAL_TABLET | Freq: Every day | ORAL | 1 refills | Status: DC

## 2023-10-07 MED ORDER — MONTELUKAST SODIUM 10 MG PO TABS: 10.0000 mg | ORAL_TABLET | Freq: Every day | ORAL | 1 refills | Status: DC

## 2023-10-07 MED ORDER — TRELEGY ELLIPTA 100-62.5-25 MCG/ACT IN AEPB: 1.0000 | INHALATION_SPRAY | Freq: Every day | RESPIRATORY_TRACT | 3 refills | Status: DC

## 2023-10-07 MED ORDER — ATORVASTATIN CALCIUM 10 MG PO TABS: 10.0000 mg | ORAL_TABLET | Freq: Every day | ORAL | 1 refills | Status: DC

## 2023-10-07 MED ORDER — VITAMIN D (ERGOCALCIFEROL) 50000 UNITS PO CAPS: ORAL_CAPSULE | ORAL | 1 refills | Status: DC

## 2023-10-07 NOTE — Progress Notes (Signed)
Established Patient Office Visit  Subjective:  [name], female    DOB: 14-Apr-1956  Age: 67 y.o. MRN: [id number]  CC:  Chief Complaint  Patient presents with   Medical Management of Chronic Issues    HPI Brittany Short presents for chronic follow up  Pt is now seeing cardiology and diagnosed with PAT - currently on toprol XL 25mg  bid and was also started on Cardizem CD 120mg  qd -  and that is controlling symptoms well - she follows with Dr Dulce Sellar regularly  Pt with history of anxiety - symptoms are stable on effexor xr 37.5mg  and voices no concerns or problems- requests refill of medication   Pt with history of GERD - symptoms stable on protonix 40mg  qd -   Pt with history of vit D def - is taking weekly supplement - due to repeat labwork  Pt with history of asthma/copd - currently taking trelegy and albuterol - voices no concerns or problems today  Past Medical History:  Diagnosis Date   GERD (gastroesophageal reflux disease)    Major depressive disorder, single episode, moderate (HCC)    Mild intermittent asthma with (acute) exacerbation    Nontoxic single thyroid nodule     Past Surgical History:  Procedure Laterality Date   CESAREAN SECTION  1982 & 1998   X2   THYROIDECTOMY Right     Family History  Problem Relation Age of Onset   Lung cancer Mother    COPD Mother    Heart attack Father    Depression Sister    Suicidality Sister    Lung cancer Brother    COPD Brother    Breast cancer Neg Hx     Social History   Socioeconomic History   Marital status: Married    Spouse name: Brittany Short   Number of children: 2   Years of education: Not on file   Highest education level: Not on file  Occupational History   Not on file  Tobacco Use   Smoking status: Never   Smokeless tobacco: Never  Vaping Use   Vaping status: Never Used  Substance and Sexual Activity   Alcohol use: Not Currently   Drug use: Never   Sexual activity: Yes    Partners:  Male    Birth control/protection: None  Other Topics Concern   Not on file  Social History Narrative   Not on file   Social Determinants of Health   Financial Resource Strain: Low Risk  (04/24/2023)   Overall Financial Resource Strain (CARDIA)    Difficulty of Paying Living Expenses: Not hard at all  Food Insecurity: No Food Insecurity (04/24/2023)   Hunger Vital Sign    Worried About Running Out of Food in the Last Year: Never true    Ran Out of Food in the Last Year: Never true  Transportation Needs: No Transportation Needs (04/24/2023)   PRAPARE - Administrator, Civil Service (Medical): No    Lack of Transportation (Non-Medical): No  Physical Activity: Insufficiently Active (04/24/2023)   Exercise Vital Sign    Days of Exercise per Week: 3 days    Minutes of Exercise per Session: 30 min  Stress: No Stress Concern Present (04/24/2023)   [employer] of Occupational Health - Occupational Stress Questionnaire    Feeling of Stress : Not at all  Social Connections: Socially Integrated (04/24/2023)   Social Connection and Isolation Panel [NHANES]    Frequency of Communication with Friends and Family: More  than three times a week    Frequency of Social Gatherings with Friends and Family: Twice a week    Attends Religious Services: More than 4 times per year    Active Member of [employer] or Organizations: Yes    Attends Engineer, structural: More than 4 times per year    Marital Status: Married  Catering manager Violence: Not At Risk (04/24/2023)   Humiliation, Afraid, Rape, and Kick questionnaire    Fear of Current or Ex-Partner: No    Emotionally Abused: No    Physically Abused: No    Sexually Abused: No     Current Outpatient Medications:    albuterol (PROVENTIL) (2.5 MG/3ML) 0.083% nebulizer solution, INHALE ONE VIAL VIA NEBULIZER EVERY 6 HOURS AS NEEDED FOR WHEEZING AND SHORTNESS OF BREATH, Disp: 75 mL, Rfl: 3   albuterol (VENTOLIN HFA) 108 (90 Base)  MCG/ACT inhaler, Inhale 2 puffs into the lungs every 4 (four) hours as needed for wheezing or shortness of breath., Disp: 18 g, Rfl: 2   celecoxib (CELEBREX) 200 MG capsule, Take 1 capsule (200 mg total) by mouth daily., Disp: 90 capsule, Rfl: 3   cetirizine (ZYRTEC) 10 MG chewable tablet, Chew 10 mg by mouth daily., Disp: , Rfl:    diltiazem (CARDIZEM CD) 120 MG 24 hr capsule, Take 1 capsule (120 mg total) by mouth daily., Disp: 90 capsule, Rfl: 3   fluticasone (FLONASE) 50 MCG/ACT nasal spray, Place 2 sprays into both nostrils daily., Disp: 16 g, Rfl: 3   pantoprazole (PROTONIX) 40 MG tablet, Take 1 tablet (40 mg total) by mouth daily., Disp: 90 tablet, Rfl: 3   atorvastatin (LIPITOR) 10 MG tablet, Take 1 tablet (10 mg total) by mouth daily., Disp: 90 tablet, Rfl: 1   Fluticasone-Umeclidin-Vilant (TRELEGY ELLIPTA) 100-62.5-25 MCG/ACT AEPB, Inhale 1 puff into the lungs daily., Disp: 1 each, Rfl: 3   metoprolol succinate (TOPROL XL) 25 MG 24 hr tablet, Take 1 tablet (25 mg total) by mouth daily., Disp: 90 tablet, Rfl: 1   montelukast (SINGULAIR) 10 MG tablet, Take 1 tablet (10 mg total) by mouth daily., Disp: 90 tablet, Rfl: 1   venlafaxine XR (EFFEXOR-XR) 37.5 MG 24 hr capsule, TAKE 1 CAPSULE BY MOUTH EVERY DAY, Disp: 90 capsule, Rfl: 1   Vitamin D, Ergocalciferol, 50000 units CAPS, TAKE 1 CAPSULE BY MOUTH EVERY SEVEN DAYS., Disp: 12 capsule, Rfl: 1   No Known Allergies CONSTITUTIONAL: Negative for chills, fatigue, fever, unintentional weight gain and unintentional weight loss.  E/N/T: Negative for ear pain, nasal congestion and sore throat.  CARDIOVASCULAR: Negative for chest pain, dizziness, palpitations and pedal edema.  RESPIRATORY: Negative for recent cough and dyspnea.  GASTROINTESTINAL: Negative for abdominal pain, acid reflux symptoms, constipation, diarrhea, nausea and vomiting.  MSK: Negative for arthralgias and myalgias.  INTEGUMENTARY: Negative for rash.  NEUROLOGICAL: Negative for  dizziness and headaches.  PSYCHIATRIC: Negative for sleep disturbance and to question depression screen.  Negative for depression, negative for anhedonia.        Objective:  PHYSICAL EXAM:   VS: BP 116/62 (BP Location: Left Arm, Patient Position: Sitting)   Pulse 62   Temp 97.8 F (36.6 C) (Temporal)   Ht 5' (1.524 m)   Wt 171 lb (77.6 kg)   LMP  (LMP Unknown)   SpO2 93%   BMI 33.40 kg/m   GEN: Well nourished, well developed, in no acute distress  Cardiac: RRR; no murmurs, rubs, or gallops,no edema -  Respiratory:  normal respiratory rate  and pattern with no distress - normal breath sounds with no rales, rhonchi, wheezes or rubs  MS: no deformity or atrophy  Skin: warm and dry, no rash  Neuro:  Alert and Oriented x 3,- CN II-Xii grossly intact Psych: euthymic mood, appropriate affect and demeanor   Health Maintenance Due  Topic Date Due   DTaP/Tdap/Td (1 - Tdap) Never done   Zoster Vaccines- Shingrix (1 of 2) Never done    There are no preventive care reminders to display for this patient.  Lab Results  Component Value Date   TSH 2.830 04/04/2023   Lab Results  Component Value Date   WBC 8.0 04/04/2023   HGB 13.9 04/04/2023   HCT 42.5 04/04/2023   MCV 93 04/04/2023   PLT 247 04/04/2023   Lab Results  Component Value Date   NA 142 04/04/2023   K 4.9 04/04/2023   CO2 25 04/04/2023   GLUCOSE 94 04/04/2023   BUN 13 04/04/2023   CREATININE 0.62 04/04/2023   BILITOT 0.4 04/04/2023   ALKPHOS 91 04/04/2023   AST 16 04/04/2023   ALT 18 04/04/2023   PROT 6.7 04/04/2023   ALBUMIN 4.4 04/04/2023   CALCIUM 9.2 04/04/2023   EGFR 97 04/04/2023   Lab Results  Component Value Date   CHOL 156 04/04/2023   Lab Results  Component Value Date   HDL 60 04/04/2023   Lab Results  Component Value Date   LDLCALC 80 04/04/2023   Lab Results  Component Value Date   TRIG 85 04/04/2023   Lab Results  Component Value Date   CHOLHDL 2.6 04/04/2023   No results  found for: "HGBA1C"    Assessment & Plan:   Problem List Items Addressed This Visit       Cardiovascular and Mediastinum   NSVT (nonsustained ventricular tachycardia) (HCC) - Primary   Relevant Orders   CBC with Differential/Platelet   Comprehensive metabolic panel   Lipid panel TSH Continue current meds and follow up with cardiology as directed      Digestive   GERD (gastroesophageal reflux disease)   Relevant Medications   pantoprazole (PROTONIX) 40 MG tablet        Asthma Continue current meds   Sample given of trelegy        Other   Major depressive disorder, single episode, moderate (HCC)   Relevant Medications   venlafaxine XR (EFFEXOR-XR) 37.5 MG 24 hr capsule             Vitamin D insufficiency       Relevant Orders   VITAMIN D 25 Hydroxy (Vit-D Deficiency, Fractures)       Meds ordered this encounter  Medications   Fluticasone-Umeclidin-Vilant (TRELEGY ELLIPTA) 100-62.5-25 MCG/ACT AEPB    Sig: Inhale 1 puff into the lungs daily.    Dispense:  1 each    Refill:  3    Order Specific Question:   Supervising Provider    Answer:   Corey Harold   atorvastatin (LIPITOR) 10 MG tablet    Sig: Take 1 tablet (10 mg total) by mouth daily.    Dispense:  90 tablet    Refill:  1    Order Specific Question:   Supervising Provider    Answer:   Corey Harold   metoprolol succinate (TOPROL XL) 25 MG 24 hr tablet    Sig: Take 1 tablet (25 mg total) by mouth daily.    Dispense:  90 tablet    Refill:  1    Order Specific Question:   Supervising Provider    Answer:   COX, KIRSTEN [[id number]]   montelukast (SINGULAIR) 10 MG tablet    Sig: Take 1 tablet (10 mg total) by mouth daily.    Dispense:  90 tablet    Refill:  1    Order Specific Question:   Supervising Provider    Answer:   Corey Harold   venlafaxine XR (EFFEXOR-XR) 37.5 MG 24 hr capsule    Sig: TAKE 1 CAPSULE BY MOUTH EVERY DAY    Dispense:  90 capsule    Refill:  1    Order  Specific Question:   Supervising Provider    Answer:   Corey Harold   Vitamin D, Ergocalciferol, 50000 units CAPS    Sig: TAKE 1 CAPSULE BY MOUTH EVERY SEVEN DAYS.    Dispense:  12 capsule    Refill:  1    Order Specific Question:   Supervising Provider    Answer:   Corey Harold   Follow-up: Return in about 6 months (around 04/06/2024) for chronic fasting follow-up.    SARA R Danetra Glock, PA-C

## 2023-10-08 LAB — TSH: TSH: 2.16 u[IU]/mL (ref 0.450–4.500)

## 2023-10-08 LAB — COMPREHENSIVE METABOLIC PANEL
ALT: 18 [IU]/L (ref 0–32)
AST: 16 [IU]/L (ref 0–40)
Albumin: 4.3 g/dL (ref 3.9–4.9)
Alkaline Phosphatase: 93 [IU]/L (ref 44–121)
BUN/Creatinine Ratio: 28 (ref 12–28)
BUN: 18 mg/dL (ref 8–27)
Bilirubin Total: 0.4 mg/dL (ref 0.0–1.2)
CO2: 25 mmol/L (ref 20–29)
Calcium: 9.4 mg/dL (ref 8.7–10.3)
Chloride: 105 mmol/L (ref 96–106)
Creatinine, Ser: 0.64 mg/dL (ref 0.57–1.00)
Globulin, Total: 2.5 g/dL (ref 1.5–4.5)
Glucose: 104 mg/dL — ABNORMAL HIGH (ref 70–99)
Potassium: 4.6 mmol/L (ref 3.5–5.2)
Sodium: 144 mmol/L (ref 134–144)
Total Protein: 6.8 g/dL (ref 6.0–8.5)
eGFR: 96 mL/min/{1.73_m2} (ref >59)

## 2023-10-08 LAB — CBC WITH DIFFERENTIAL/PLATELET
Basophils Absolute: 0 10*3/uL (ref 0.0–0.2)
Basos: 1 %
EOS (ABSOLUTE): 0.2 10*3/uL (ref 0.0–0.4)
Eos: 4 %
Hematocrit: 43.3 % (ref 34.0–46.6)
Hemoglobin: 14.1 g/dL (ref 11.1–15.9)
Immature Grans (Abs): 0 10*3/uL (ref 0.0–0.1)
Immature Granulocytes: 0 %
Lymphocytes Absolute: 2.6 10*3/uL (ref 0.7–3.1)
Lymphs: 42 %
MCH: 30.3 pg (ref 26.6–33.0)
MCHC: 32.6 g/dL (ref 31.5–35.7)
MCV: 93 fL (ref 79–97)
Monocytes Absolute: 0.4 10*3/uL (ref 0.1–0.9)
Monocytes: 7 %
Neutrophils Absolute: 3 10*3/uL (ref 1.4–7.0)
Neutrophils: 46 %
Platelets: 189 10*3/uL (ref 150–450)
RBC: 4.65 x10E6/uL (ref 3.77–5.28)
RDW: 11.8 % (ref 11.7–15.4)
WBC: 6.3 10*3/uL (ref 3.4–10.8)

## 2023-10-08 LAB — LIPID PANEL
Chol/HDL Ratio: 3.2 {ratio} (ref 0.0–4.4)
Cholesterol, Total: 188 mg/dL (ref 100–199)
HDL: 58 mg/dL (ref >39)
LDL Chol Calc (NIH): 111 mg/dL — ABNORMAL HIGH (ref 0–99)
Triglycerides: 106 mg/dL (ref 0–149)
VLDL Cholesterol Cal: 19 mg/dL (ref 5–40)

## 2023-10-08 LAB — VITAMIN D 25 HYDROXY (VIT D DEFICIENCY, FRACTURES): Vit D, 25-Hydroxy: 56.3 ng/mL (ref 30.0–100.0)

## 2023-10-16 ENCOUNTER — Encounter: Payer: Self-pay | Admitting: Physician Assistant

## 2023-10-20 ENCOUNTER — Encounter: Payer: Self-pay | Admitting: Physician Assistant

## 2023-10-24 ENCOUNTER — Encounter: Payer: Self-pay | Admitting: Physician Assistant

## 2023-10-27 ENCOUNTER — Encounter: Payer: Self-pay | Admitting: Physician Assistant

## 2023-10-27 ENCOUNTER — Ambulatory Visit (INDEPENDENT_AMBULATORY_CARE_PROVIDER_SITE_OTHER): Payer: Medicare HMO | Admitting: Physician Assistant

## 2023-10-27 VITALS — BP 138/62 | HR 66 | Temp 97.8°F | Resp 16 | Ht 60.0 in | Wt 171.4 lb

## 2023-10-27 DIAGNOSIS — R0789 Other chest pain: Secondary | ICD-10-CM

## 2023-10-27 DIAGNOSIS — R0602 Shortness of breath: Secondary | ICD-10-CM | POA: Diagnosis not present

## 2023-10-27 DIAGNOSIS — J060 Acute laryngopharyngitis: Secondary | ICD-10-CM

## 2023-10-27 DIAGNOSIS — R053 Chronic cough: Secondary | ICD-10-CM

## 2023-10-27 DIAGNOSIS — R062 Wheezing: Secondary | ICD-10-CM | POA: Diagnosis not present

## 2023-10-27 DIAGNOSIS — J4521 Mild intermittent asthma with (acute) exacerbation: Secondary | ICD-10-CM | POA: Diagnosis not present

## 2023-10-27 HISTORY — DX: Other chest pain: R07.89

## 2023-10-27 HISTORY — DX: Acute laryngopharyngitis: J06.0

## 2023-10-27 HISTORY — DX: Shortness of breath: R06.02

## 2023-10-27 HISTORY — DX: Chronic cough: R05.3

## 2023-10-27 MED ORDER — PREDNISONE 20 MG PO TABS: ORAL_TABLET | ORAL | 0 refills | Status: DC

## 2023-10-27 MED ORDER — BENZONATATE 100 MG PO CAPS: 100.0000 mg | ORAL_CAPSULE | Freq: Two times a day (BID) | ORAL | 0 refills | Status: DC | PRN

## 2023-10-27 MED ORDER — AZITHROMYCIN 250 MG PO TABS: ORAL_TABLET | ORAL | 0 refills | Status: AC

## 2023-10-27 NOTE — Progress Notes (Signed)
Acute Office Visit  Subjective:    [name], female    DOB: [birth year], 67 y.o.   MRN: [id number]  Chief Complaint  Patient presents with   Cough    HPI: Patient is in today for complaints of intermittent cough over the past few weeks that has worsened along with episodes of shortness of breath Pt states that she had bronchtis over a month ago and cleared well from that however in the past 2 weeks she has had chronic nonproductive cough.  Says she feels like she can't get a good deep breath at times and that a lot of times triggers her cough.  She has had intermittent chest tightness associated with the cough.  She says this can happen at rest or with activity She denies any swelling of extremities She denies fever but has had some mild congestion -- she does use singulair, trelegy and albuterol regularly   Current Outpatient Medications:    albuterol (PROVENTIL) (2.5 MG/3ML) 0.083% nebulizer solution, INHALE ONE VIAL VIA NEBULIZER EVERY 6 HOURS AS NEEDED FOR WHEEZING AND SHORTNESS OF BREATH, Disp: 75 mL, Rfl: 3   albuterol (VENTOLIN HFA) 108 (90 Base) MCG/ACT inhaler, Inhale 2 puffs into the lungs every 4 (four) hours as needed for wheezing or shortness of breath., Disp: 18 g, Rfl: 2   atorvastatin (LIPITOR) 10 MG tablet, Take 1 tablet (10 mg total) by mouth daily., Disp: 90 tablet, Rfl: 1   azithromycin (ZITHROMAX) 250 MG tablet, Take 2 tablets on day 1, then 1 tablet daily on days 2 through 5, Disp: 6 tablet, Rfl: 0   benzonatate (TESSALON) 100 MG capsule, Take 1 capsule (100 mg total) by mouth 2 (two) times daily as needed for cough., Disp: 20 capsule, Rfl: 0   celecoxib (CELEBREX) 200 MG capsule, Take 1 capsule (200 mg total) by mouth daily., Disp: 90 capsule, Rfl: 3   cetirizine (ZYRTEC) 10 MG chewable tablet, Chew 10 mg by mouth daily., Disp: , Rfl:    diltiazem (CARDIZEM CD) 120 MG 24 hr capsule, Take 1 capsule (120 mg total) by mouth daily., Disp: 90 capsule,  Rfl: 3   fluticasone (FLONASE) 50 MCG/ACT nasal spray, Place 2 sprays into both nostrils daily., Disp: 16 g, Rfl: 3   Fluticasone-Umeclidin-Vilant (TRELEGY ELLIPTA) 100-62.5-25 MCG/ACT AEPB, Inhale 1 puff into the lungs daily., Disp: 1 each, Rfl: 3   metoprolol succinate (TOPROL XL) 25 MG 24 hr tablet, Take 1 tablet (25 mg total) by mouth daily., Disp: 90 tablet, Rfl: 1   montelukast (SINGULAIR) 10 MG tablet, Take 1 tablet (10 mg total) by mouth daily., Disp: 90 tablet, Rfl: 1   pantoprazole (PROTONIX) 40 MG tablet, Take 1 tablet (40 mg total) by mouth daily., Disp: 90 tablet, Rfl: 3   predniSONE (DELTASONE) 20 MG tablet, 1 po tid for 3 days then 1 po bid for 3 days then 1 po qd for 3 days, Disp: 18 tablet, Rfl: 0   venlafaxine XR (EFFEXOR-XR) 37.5 MG 24 hr capsule, TAKE 1 CAPSULE BY MOUTH EVERY DAY, Disp: 90 capsule, Rfl: 1   Vitamin D, Ergocalciferol, 50000 units CAPS, TAKE 1 CAPSULE BY MOUTH EVERY SEVEN DAYS., Disp: 12 capsule, Rfl: 1  No Known Allergies  ROS CONSTITUTIONAL: Negative for chills, fatigue, fever,  E/N/T: has had some mild congestion CARDIOVASCULAR: see HPI RESPIRATORY: see HPI GASTROINTESTINAL: Negative for abdominal pain, acid reflux symptoms, constipation, diarrhea, nausea and vomiting.  NEUROLOGICAL: Negative for dizziness and headaches.  PSYCHIATRIC: Negative for sleep  disturbance and to question depression screen.  Negative for depression, negative for anhedonia.      Objective:    PHYSICAL EXAM:   BP 138/62 (BP Location: Right Arm, Patient Position: Sitting, Cuff Size: Normal)   Pulse 66   Temp 97.8 F (36.6 C) (Temporal)   Resp 16   Ht 5' (1.524 m)   Wt 171 lb 6.4 oz (77.7 kg)   LMP  (LMP Unknown)   SpO2 96%   BMI 33.47 kg/m    GEN: Well nourished, well developed, in no acute distress  HEENT: normal external ears and nose - normal external auditory canals and TMS -  - Lips, Teeth and Gums - normal  Oropharynx - normal mucosa, palate, and posterior  pharynx Cardiac: RRR; no murmurs, rubs, or gallops,no edema -  Respiratory:  normal respiratory rate and pattern with no distress - normal breath sounds with no rales, rhonchi, wheezes or rubs GI: normal bowel sounds, no masses or tenderness Psych: euthymic mood, appropriate affect and demeanor  EKG - normal    Assessment & Plan:    Atypical chest pain -     EKG 12-Lead -     DG Chest 2 View; Future -     CBC with Differential/Platelet -     Comprehensive metabolic panel -     Troponin I -  Chronic cough -     DG Chest 2 View; Future  Mild intermittent asthma with acute exacerbation Continue inhalers Shortness of breath -     CBC with Differential/Platelet -     Comprehensive metabolic panel -     D-dimer, quantitative -     Pro b natriuretic peptide (BNP)  Acute laryngopharyngitis -     predniSONE; 1 po tid for 3 days then 1 po bid for 3 days then 1 po qd for 3 days  Dispense: 18 tablet; Refill: 0 -     Benzonatate; Take 1 capsule (100 mg total) by mouth 2 (two) times daily as needed for cough.  Dispense: 20 capsule; Refill: 0 -     Azithromycin; Take 2 tablets on day 1, then 1 tablet daily on days 2 through 5  Dispense: 6 tablet; Refill: 0   If labwork abnormal will send through ED for further evaluation  Follow-up: Return in about 2 weeks (around 11/10/2023), or if symptoms worsen or fail to improve, for follow-up.  An After Visit Summary was printed and given to the patient.  Jettie Pagan [facility] [phone]

## 2023-10-28 ENCOUNTER — Encounter: Payer: Self-pay | Admitting: Physician Assistant

## 2023-10-28 LAB — COMPREHENSIVE METABOLIC PANEL: EGFR: 94

## 2023-10-30 ENCOUNTER — Encounter: Payer: Self-pay | Admitting: Physician Assistant

## 2023-11-06 ENCOUNTER — Encounter: Payer: Self-pay | Admitting: Physician Assistant

## 2023-11-06 ENCOUNTER — Ambulatory Visit (INDEPENDENT_AMBULATORY_CARE_PROVIDER_SITE_OTHER): Payer: HMO | Admitting: Physician Assistant

## 2023-11-06 VITALS — BP 140/60 | HR 87 | Temp 97.4°F | Resp 14 | Ht 60.0 in | Wt 172.0 lb

## 2023-11-06 DIAGNOSIS — I10 Essential (primary) hypertension: Secondary | ICD-10-CM | POA: Diagnosis not present

## 2023-11-06 DIAGNOSIS — R053 Chronic cough: Secondary | ICD-10-CM | POA: Diagnosis not present

## 2023-11-06 DIAGNOSIS — J4521 Mild intermittent asthma with (acute) exacerbation: Secondary | ICD-10-CM

## 2023-11-06 MED ORDER — HYDROCODONE BIT-HOMATROP MBR 5-1.5 MG/5ML PO SOLN: 5.0000 mL | Freq: Four times a day (QID) | ORAL | 0 refills | Status: DC | PRN

## 2023-11-06 NOTE — Assessment & Plan Note (Addendum)
 Elevated blood pressure noted during visit. -Recheck blood pressure during visit. -Continue current antihypertensive regimen.  -Mild elevation possibly due to albuterol  and steroids, Continue to monitor BP Readings from Last 3 Encounters:  11/06/23 (!) 140/60  10/27/23 138/62  10/07/23 116/62

## 2023-11-06 NOTE — Assessment & Plan Note (Signed)
 Persistent cough since Thanksgiving, exacerbated by talking, laughing, and walking. Cough is worse at night and occasionally induces vomiting. No other associated symptoms. Chest X-ray was negative. Patient has a history of asthma and is currently using inhalers and has been on multiple courses of antibiotics and steroids with temporary relief. -Order Chest CT scan for further evaluation. -Perform respiratory viral panel to rule out viral etiology. -Prescribe cough suppressant with codeine  for symptomatic relief, mainly at night. -Follow-up after results of CT scan and viral panel are available.

## 2023-11-06 NOTE — Assessment & Plan Note (Signed)
 Patient is currently using inhalers as needed, which provide temporary relief. Mild wheezing noted on examination. -Continue current inhaler regimen. -Consider adjustment of asthma management plan based on results of Chest CT and respiratory viral panel.

## 2023-11-06 NOTE — Patient Instructions (Signed)
 VISIT SUMMARY:  During today's visit, we discussed your ongoing chronic cough, which has been troubling you since Thanksgiving. We also reviewed your asthma management and noted an elevated blood pressure reading.  YOUR PLAN:  -CHRONIC COUGH: A chronic cough is a cough that lasts for an extended period of time. We will conduct a Chest CT scan and a respiratory viral panel to further investigate the cause. In the meantime, I have prescribed a cough suppressant with codeine  to help you manage the symptoms, especially at night.  -ASTHMA: Asthma is a condition where your airways narrow and swell, producing extra mucus. You should continue using your inhalers as needed. We may adjust your asthma management plan based on the results of the Chest CT and respiratory viral panel.  -HYPERTENSION: Hypertension is high blood pressure. We noted an elevated blood pressure reading today. Please continue with your current antihypertensive medication, and we will recheck your blood pressure during your next visit.  INSTRUCTIONS:  Please follow up after the results of your Chest CT scan and respiratory viral panel are available.

## 2023-11-06 NOTE — Progress Notes (Signed)
 Subjective:  [name], female    DOB: 11-04-1955  Age: 68 y.o. MRN: [id number]  Chief Complaint  Patient presents with   Cough    HPI   Patient is here dry cough since 2 months ago. She took antibiotics, prednisone , and inhaler but she still has dry cough when she talks or sometimes in the middle of the day. Discussed the use of AI scribe software for clinical note transcription with the patient, who gave verbal consent to proceed.  History of Present Illness   A 68 year old patient with a history of asthma presents with a chronic cough that has been ongoing since Thanksgiving. The cough is described as persistent and is aggravated by talking, laughing, and physical activity. The patient reports temporary relief with the use of inhalers and courses of antibiotics and steroids. However, the cough returns once the effect of the medication wears off. The patient denies any smoking history but has been exposed to secondhand smoke. The patient also has a history of COVID-19 infection. The cough is worse at night and often disrupts sleep. The patient denies any other new symptoms. Despite multiple treatments, the cough persists, causing significant distress and concern.          10/07/2023    9:00 AM 04/24/2023    9:03 AM 04/04/2023    9:11 AM 03/04/2023    8:56 AM 10/01/2022    8:45 AM  Depression screen PHQ 2/9  Decreased Interest 0 0 0 0 0  Down, Depressed, Hopeless 0 0 0 0 0  PHQ - 2 Score 0 0 0 0 0  Altered sleeping 0 0 0 3 1  Tired, decreased energy 0 0 0 0 0  Change in appetite 0 0 0 0 0  Feeling bad or failure about yourself  0 0 0 0 0  Trouble concentrating 0 0 0 0 0  Moving slowly or fidgety/restless 0 0 0 0 0  Suicidal thoughts 0 0 0 0 0  PHQ-9 Score 0 0 0 3 1  Difficult doing work/chores Not difficult at all Not difficult at all Not difficult at all Not difficult at all Not difficult at all        10/07/2023    9:00 AM  Fall Risk   Falls in the past year?  0  Number falls in past yr: 0  Injury with Fall? 0  Risk for fall due to : No Fall Risks  Follow up Falls evaluation completed    Patient Care Team: Nicholaus Credit, PA-C as PCP - General (Physician Assistant) Sheena Pugh, DO as PCP - Cardiology [facility], Redell PARAS, MD as Consulting Physician (Cardiology)   Review of Systems  Constitutional:  Negative for chills, fatigue and fever.  HENT:  Negative for congestion, ear pain and sore throat.   Respiratory:  Positive for cough and shortness of breath. Negative for wheezing.   Cardiovascular:  Negative for chest pain and palpitations.  Gastrointestinal:  Negative for abdominal pain, constipation, diarrhea, nausea and vomiting.  Endocrine: Negative for polydipsia, polyphagia and polyuria.  Genitourinary:  Negative for difficulty urinating and dysuria.  Musculoskeletal:  Negative for arthralgias, back pain and myalgias.  Skin:  Negative for rash.  Neurological:  Negative for headaches.  Psychiatric/Behavioral:  Negative for dysphoric mood. The patient is not nervous/anxious.     Current Outpatient Medications on File Prior to Visit  Medication Sig Dispense Refill   albuterol  (PROVENTIL ) (2.5 MG/3ML) 0.083% nebulizer solution INHALE  ONE VIAL VIA NEBULIZER EVERY 6 HOURS AS NEEDED FOR WHEEZING AND SHORTNESS OF BREATH 75 mL 3   albuterol  (VENTOLIN  HFA) 108 (90 Base) MCG/ACT inhaler Inhale 2 puffs into the lungs every 4 (four) hours as needed for wheezing or shortness of breath. 18 g 2   atorvastatin  (LIPITOR) 10 MG tablet Take 1 tablet (10 mg total) by mouth daily. 90 tablet 1   benzonatate  (TESSALON ) 100 MG capsule Take 1 capsule (100 mg total) by mouth 2 (two) times daily as needed for cough. 20 capsule 0   celecoxib  (CELEBREX ) 200 MG capsule Take 1 capsule (200 mg total) by mouth daily. 90 capsule 3   cetirizine (ZYRTEC) 10 MG chewable tablet Chew 10 mg by mouth daily.     diltiazem  (CARDIZEM  CD) 120 MG 24 hr  capsule Take 1 capsule (120 mg total) by mouth daily. 90 capsule 3   fluticasone  (FLONASE ) 50 MCG/ACT nasal spray Place 2 sprays into both nostrils daily. 16 g 3   Fluticasone -Umeclidin-Vilant (TRELEGY ELLIPTA ) 100-62.5-25 MCG/ACT AEPB Inhale 1 puff into the lungs daily. 1 each 3   metoprolol  succinate (TOPROL  XL) 25 MG 24 hr tablet Take 1 tablet (25 mg total) by mouth daily. 90 tablet 1   montelukast  (SINGULAIR ) 10 MG tablet Take 1 tablet (10 mg total) by mouth daily. 90 tablet 1   pantoprazole  (PROTONIX ) 40 MG tablet Take 1 tablet (40 mg total) by mouth daily. 90 tablet 3   venlafaxine  XR (EFFEXOR -XR) 37.5 MG 24 hr capsule TAKE 1 CAPSULE BY MOUTH EVERY DAY 90 capsule 1   Vitamin D , Ergocalciferol , 50000 units CAPS TAKE 1 CAPSULE BY MOUTH EVERY SEVEN DAYS. 12 capsule 1   No current facility-administered medications on file prior to visit.   Past Medical History:  Diagnosis Date   GERD (gastroesophageal reflux disease)    Major depressive disorder, single episode, moderate (HCC)    Mild intermittent asthma with (acute) exacerbation    Nontoxic single thyroid  nodule    Past Surgical History:  Procedure Laterality Date   CESAREAN SECTION  1982 & 1998   X2   THYROIDECTOMY Right     Family History  Problem Relation Age of Onset   Lung cancer Mother    COPD Mother    Heart attack Father    Depression Sister    Suicidality Sister    Lung cancer Brother    COPD Brother    Breast cancer Neg Hx    Social History   Socioeconomic History   Marital status: Married    Spouse name: Dick   Number of children: 2   Years of education: Not on file   Highest education level: Not on file  Occupational History   Not on file  Tobacco Use   Smoking status: Never   Smokeless tobacco: Never  Vaping Use   Vaping status: Never Used  Substance and Sexual Activity   Alcohol use: Not Currently   Drug use: Never   Sexual activity: Yes    Partners: Male    Birth control/protection: None   Other Topics Concern   Not on file  Social History Narrative   Not on file   Social Drivers of Health   Financial Resource Strain: Low Risk  (04/24/2023)   Overall Financial Resource Strain (CARDIA)    Difficulty of Paying Living Expenses: Not hard at all  Food Insecurity: No Food Insecurity (04/24/2023)   Hunger Vital Sign    Worried About Running Out of Food in the  Last Year: Never true    Ran Out of Food in the Last Year: Never true  Transportation Needs: No Transportation Needs (04/24/2023)   PRAPARE - Administrator, Civil Service (Medical): No    Lack of Transportation (Non-Medical): No  Physical Activity: Insufficiently Active (04/24/2023)   Exercise Vital Sign    Days of Exercise per Week: 3 days    Minutes of Exercise per Session: 30 min  Stress: No Stress Concern Present (04/24/2023)   [employer] of Occupational Health - Occupational Stress Questionnaire    Feeling of Stress : Not at all  Social Connections: Socially Integrated (04/24/2023)   Social Connection and Isolation Panel [NHANES]    Frequency of Communication with Friends and Family: More than three times a week    Frequency of Social Gatherings with Friends and Family: Twice a week    Attends Religious Services: More than 4 times per year    Active Member of [employer] or Organizations: Yes    Attends Engineer, Structural: More than 4 times per year    Marital Status: Married    Objective:  BP (!) 140/60   Pulse 87   Temp (!) 97.4 F (36.3 C)   Resp 14   Ht 5' (1.524 m)   Wt 172 lb (78 kg)   LMP  (LMP Unknown)   SpO2 97%   BMI 33.59 kg/m      11/06/2023    2:28 PM 10/27/2023    3:39 PM 10/07/2023    8:59 AM  BP/Weight  Systolic BP 140 138 116  Diastolic BP 60 62 62  Wt. (Lbs) 172 171.4 171  BMI 33.59 kg/m2 33.47 kg/m2 33.4 kg/m2    Physical Exam Vitals reviewed.  Constitutional:      Appearance: Normal appearance.  Neck:     Vascular: No carotid bruit.   Cardiovascular:     Rate and Rhythm: Normal rate and regular rhythm.     Heart sounds: Normal heart sounds.  Pulmonary:     Effort: Pulmonary effort is normal.     Breath sounds: Examination of the left-middle field reveals wheezing. Wheezing present.  Abdominal:     General: Bowel sounds are normal.     Palpations: Abdomen is soft.     Tenderness: There is no abdominal tenderness.  Neurological:     Mental Status: She is alert and oriented to person, place, and time.  Psychiatric:        Mood and Affect: Mood normal.        Behavior: Behavior normal.     Diabetic Foot Exam - Simple   No data filed      Lab Results  Component Value Date   WBC 6.3 10/07/2023   HGB 14.1 10/07/2023   HCT 43.3 10/07/2023   PLT 189 10/07/2023   GLUCOSE 104 (H) 10/07/2023   CHOL 188 10/07/2023   TRIG 106 10/07/2023   HDL 58 10/07/2023   LDLCALC 111 (H) 10/07/2023   ALT 18 10/07/2023   AST 16 10/07/2023   NA 144 10/07/2023   K 4.6 10/07/2023   CL 105 10/07/2023   CREATININE 0.64 10/07/2023   BUN 18 10/07/2023   CO2 25 10/07/2023   TSH 2.160 10/07/2023      Assessment & Plan:    Chronic cough Assessment & Plan: Persistent cough since Thanksgiving, exacerbated by talking, laughing, and walking. Cough is worse at night and occasionally induces vomiting. No other associated symptoms. Chest X-ray was  negative. Patient has a history of asthma and is currently using inhalers and has been on multiple courses of antibiotics and steroids with temporary relief. -Order Chest CT scan for further evaluation. -Perform respiratory viral panel to rule out viral etiology. -Prescribe cough suppressant with codeine  for symptomatic relief, mainly at night. -Follow-up after results of CT scan and viral panel are available.  Orders: -     Respiratory Panel w/ SARS-CoV2 -     CT CHEST WO CONTRAST; Future -     HYDROcodone  Bit-Homatrop MBr; Take 5 mLs by mouth every 6 (six) hours as needed for cough.   Dispense: 473 mL; Refill: 0  Mild intermittent asthma with acute exacerbation Assessment & Plan: Patient is currently using inhalers as needed, which provide temporary relief. Mild wheezing noted on examination. -Continue current inhaler regimen. -Consider adjustment of asthma management plan based on results of Chest CT and respiratory viral panel.   Primary hypertension Assessment & Plan: Elevated blood pressure noted during visit. -Recheck blood pressure during visit. -Continue current antihypertensive regimen.  -Mild elevation possibly due to albuterol  and steroids, Continue to monitor BP Readings from Last 3 Encounters:  11/06/23 (!) 140/60  10/27/23 138/62  10/07/23 116/62         Meds ordered this encounter  Medications   DISCONTD: HYDROcodone  bit-homatropine (HYDROMET) 5-1.5 MG/5ML syrup    Sig: Take 5 mLs by mouth every 6 (six) hours as needed for cough.    Dispense:  473 mL    Refill:  0   HYDROcodone  bit-homatropine (HYDROMET) 5-1.5 MG/5ML syrup    Sig: Take 5 mLs by mouth every 6 (six) hours as needed for cough.    Dispense:  473 mL    Refill:  0    Orders Placed This Encounter  Procedures   CT Chest Wo Contrast   Respiratory Panel w/ SARS-CoV2         Follow-up: Return if symptoms worsen or fail to improve.   I,Marla I Leal-Borjas,acting as a scribe for [employer], PA.,have documented all relevant documentation on the behalf of Nola Angles, PA,as directed by  Nola Angles, PA while in the presence of Nola Angles, [place].   An After Visit Summary was printed and given to the patient.  Nola Angles, [place] [facility] [phone]

## 2023-11-08 LAB — RESPIRATORY PANEL W/ SARS-COV2

## 2023-11-10 DIAGNOSIS — R053 Chronic cough: Secondary | ICD-10-CM | POA: Diagnosis not present

## 2023-11-13 ENCOUNTER — Other Ambulatory Visit: Payer: Self-pay

## 2023-11-13 ENCOUNTER — Encounter: Payer: Self-pay | Admitting: Physician Assistant

## 2023-11-13 DIAGNOSIS — R053 Chronic cough: Secondary | ICD-10-CM

## 2023-11-17 ENCOUNTER — Other Ambulatory Visit: Payer: Self-pay | Admitting: Physician Assistant

## 2023-11-17 ENCOUNTER — Encounter: Payer: Self-pay | Admitting: Physician Assistant

## 2023-11-17 DIAGNOSIS — R053 Chronic cough: Secondary | ICD-10-CM

## 2023-11-17 DIAGNOSIS — R0602 Shortness of breath: Secondary | ICD-10-CM

## 2023-11-17 DIAGNOSIS — J4521 Mild intermittent asthma with (acute) exacerbation: Secondary | ICD-10-CM

## 2023-11-17 NOTE — Telephone Encounter (Signed)
Patient viewed comment via mychart.

## 2023-11-18 ENCOUNTER — Encounter: Payer: Self-pay | Admitting: Cardiology

## 2023-11-19 ENCOUNTER — Ambulatory Visit: Payer: HMO | Attending: Cardiology | Admitting: Cardiology

## 2023-11-19 ENCOUNTER — Encounter: Payer: Self-pay | Admitting: Cardiology

## 2023-11-19 VITALS — BP 142/64 | HR 84 | Ht 60.0 in | Wt 173.0 lb

## 2023-11-19 DIAGNOSIS — E782 Mixed hyperlipidemia: Secondary | ICD-10-CM

## 2023-11-19 DIAGNOSIS — R002 Palpitations: Secondary | ICD-10-CM | POA: Diagnosis not present

## 2023-11-19 DIAGNOSIS — I10 Essential (primary) hypertension: Secondary | ICD-10-CM | POA: Diagnosis not present

## 2023-11-19 DIAGNOSIS — I493 Ventricular premature depolarization: Secondary | ICD-10-CM | POA: Diagnosis not present

## 2023-11-19 DIAGNOSIS — R072 Precordial pain: Secondary | ICD-10-CM

## 2023-11-19 DIAGNOSIS — I471 Supraventricular tachycardia, unspecified: Secondary | ICD-10-CM

## 2023-11-19 MED ORDER — METOPROLOL TARTRATE 100 MG PO TABS: 100.0000 mg | ORAL_TABLET | Freq: Once | ORAL | 0 refills | Status: DC

## 2023-11-19 MED ORDER — ACEBUTOLOL HCL 200 MG PO CAPS: 200.0000 mg | ORAL_CAPSULE | Freq: Two times a day (BID) | ORAL | 3 refills | Status: DC

## 2023-11-19 NOTE — Patient Instructions (Addendum)
Medication Instructions:  Your physician has recommended you make the following change in your medication:   START: Sectral 200 mg two times daily STOP: Metoprolol succinate  *If you need a refill on your cardiac medications before your next appointment, please call your pharmacy*   Lab Work: Your physician recommends that you return for lab work in:   Labs today: BMP  If you have labs (blood work) drawn today and your tests are completely normal, you will receive your results only by: MyChart Message (if you have MyChart) OR A paper copy in the mail If you have any lab test that is abnormal or we need to change your treatment, we will call you to review the results.   Testing/Procedures:   Your cardiac CT will be scheduled at one of the below locations:   [facility] [address] [place], [place] [address] [phone]  OR  [facility] [address] [place], [place] [address] [phone]  OR   [facility] [address] [place], [place] [address] [phone]  OR   [facility] [address] [place], [place] [address] [phone]  If scheduled at [facility], please arrive at the [facility] and Children's Entrance (Entrance C2) of [facility] 30 minutes prior to test start time. You can use the FREE valet parking offered at entrance C (encouraged to control the heart rate for the test)  Proceed to the [facility] Radiology Department (first floor) to check-in and test prep.  All radiology patients and guests should use entrance C2 at [facility], accessed from [facility], even though the hospital's physical address listed is [address]    If scheduled at [facility] or [facility], please arrive 15 mins early for check-in and test prep.  There is spacious parking  and easy access to the radiology department from the [facility] Heart and Vascular entrance. Please enter here and check-in with the desk attendant.   Please follow these instructions carefully (unless otherwise directed):  An IV will be required for this test and Nitroglycerin will be given.  Hold all erectile dysfunction medications at least 3 days (72 hrs) prior to test. (Ie viagra, cialis, sildenafil, tadalafil, etc)   On the Night Before the Test: Be sure to Drink plenty of water. Do not consume any caffeinated/decaffeinated beverages or chocolate 12 hours prior to your test. Do not take any antihistamines 12 hours prior to your test.  On the Day of the Test: Drink plenty of water until 1 hour prior to the test. Do not eat any food 1 hour prior to test. You may take your regular medications prior to the test.  Take metoprolol (Lopressor) two hours prior to test. Patients who wear a continuous glucose monitor MUST remove the device prior to scanning. FEMALES- please wear underwire-free bra if available, avoid dresses & tight clothing      After the Test: Drink plenty of water. After receiving IV contrast, you may experience a mild flushed feeling. This is normal. On occasion, you may experience a mild rash up to 24 hours after the test. This is not dangerous. If this occurs, you can take Benadryl 25 mg and increase your fluid intake. If you experience trouble breathing, this can be serious. If it is severe call 911 IMMEDIATELY. If it is mild, please call our office.  We will call to  schedule your test 2-4 weeks out understanding that some insurance companies will need an authorization prior to the service being performed.   For more information and frequently asked questions, please visit our website : [email or web address]  For non-scheduling related questions, please contact the cardiac imaging nurse navigator should you have any questions/concerns: Cardiac Imaging Nurse  Navigators Direct Office Dial: [phone]   For scheduling needs, including cancellations and rescheduling, please call [place], [phone].    Follow-Up: At [facility], you and your health needs are our priority.  As part of our continuing mission to provide you with exceptional heart care, we have created designated Provider Care Teams.  These Care Teams include your primary Cardiologist (physician) and Advanced Practice Providers (APPs -  Physician Assistants and Nurse Practitioners) who all work together to provide you with the care you need, when you need it.  We recommend signing up for the patient portal called "MyChart".  Sign up information is provided on this After Visit Summary.  MyChart is used to connect with patients for Virtual Visits (Telemedicine).  Patients are able to view lab/test results, encounter notes, upcoming appointments, etc.  Non-urgent messages can be sent to your provider as well.   To learn more about what you can do with MyChart, go to [email or web address].    Your next appointment:   6 month(s)  Provider:   Norman Herrlich, MD    Other Instructions Check blood pressure daily for 2 weeks, then after 2 weeks send a list of blood pressures to the office.

## 2023-11-19 NOTE — Progress Notes (Signed)
Cardiology Office Note:    Date:  11/19/2023   ID:  Brittany Short, DOB 11-Oct-1956, MRN [id number]  PCP:  Brittany Sofia, PA-C  Cardiologist:  Brittany Herrlich, MD    Referring MD: Brittany Sofia, PA-C    ASSESSMENT:    1. Precordial pain   2. Palpitations   3. PVC (premature ventricular contraction)   4. Paroxysmal SVT (supraventricular tachycardia) (HCC)   5. Primary hypertension   6. Mixed hyperlipidemia    PLAN:    In order of problems listed above:  Somewhat of a complicated visit multiple problems including chest pain further evaluation cardiac CTA this will give [place] the presence absence severity of CAD and guide medical revascularization therapy. Worsened palpitation more consistent with PVCs I am hesitant to stop the beta-blocker but will transition to a more cardioselective antiarrhythmic beta-blocker I have asked her to trend her home blood pressures for 2 weeks good technique and give me a list I would like to see her antihypertensive therapy optimized Continue her high intensity statin unless her coronary calcium score is 0 and that it becomes optimal Consult patient's request   Next appointment: 6 months   Medication Adjustments/Labs and Tests Ordered: Current medicines are reviewed at length with the patient today.  Concerns regarding medicines are outlined above.  Orders Placed This Encounter  Procedures   EKG 12-Lead   No orders of the defined types were placed in this encounter.    History of Present Illness:    Brittany Short is a 68 y.o. female with a hx of SVT PVCs hypertension and hyper lipidemia last seen since 04/25/2023.  Last visit the dose of her beta-blocker was decreased due to side effects.  Compliance with diet, lifestyle and medications: Yes   she has not done well since her respiratory infection in November she had been treated with steroids and Zithromax and has a persistent cough and had wheezing she is a bronchodilator with some improvement She  relates a recent CT of the chest unremarkable she was referred to a pulmonary in [place] and asked me to put a consult to Dr. Lucie Short that she knows for asthma care She is having more palpitation consistent with single PVC since we decreased her beta-blocker She does not feel well and she is having nonexertional aching through her shoulders across her back and into the chest she had an episode this morning last 10 or 15 minutes  I am hesitant to stop her beta-blocker but I am to switch her to acebutolol very specific cardioselective and effective antiarrhythmic beta-blocker Her other complaint of chest pain needs evaluation cardiac CTA ordered not do any EKG prior to leaving my office I suspect once her respiratory problem is improved her arrhythmia will improve Past Medical History:  Diagnosis Date   Acute laryngopharyngitis 10/27/2023   Angular cheilitis 10/01/2022   Aortic valve stenosis 01/08/2022   Atypical chest pain 10/27/2023   Bronchitis 09/17/2023   Chest pain of uncertain etiology 10/02/2020   Chronic cough 10/27/2023   Chronic frontal sinusitis 01/04/2020   Colon cancer screening 08/02/2020   Encounter for screening mammogram for breast cancer 02/15/2021   GERD (gastroesophageal reflux disease)    Major depressive disorder, single episode, moderate (HCC)    Mild intermittent asthma with (acute) exacerbation    Mild intermittent asthma with acute exacerbation 03/08/2023   Mild intermittent asthma without complication 10/01/2022   Mixed hyperlipidemia 10/01/2022   Morbid obesity (HCC) 10/02/2020   Need for prophylactic vaccination against Streptococcus pneumoniae (  pneumococcus) 08/02/2020   Needs flu shot 08/02/2020   Nontoxic single thyroid nodule    NSVT (nonsustained ventricular tachycardia) (HCC) 10/02/2020   Palpitations 08/02/2020   PAT (paroxysmal atrial tachycardia) (HCC) 10/02/2020   Primary hypertension 01/08/2022   Shortness of breath 10/27/2023   Tremor  02/15/2021   Vitamin D deficiency 10/01/2022    Current Medications: Current Meds  Medication Sig   albuterol (PROVENTIL) (2.5 MG/3ML) 0.083% nebulizer solution INHALE ONE VIAL VIA NEBULIZER EVERY 6 HOURS AS NEEDED FOR WHEEZING AND SHORTNESS OF BREATH   albuterol (VENTOLIN HFA) 108 (90 Base) MCG/ACT inhaler Inhale 2 puffs into the lungs every 4 (four) hours as needed for wheezing or shortness of breath.   atorvastatin (LIPITOR) 10 MG tablet Take 1 tablet (10 mg total) by mouth daily.   benzonatate (TESSALON) 100 MG capsule Take 1 capsule (100 mg total) by mouth 2 (two) times daily as needed for cough.   celecoxib (CELEBREX) 200 MG capsule Take 1 capsule (200 mg total) by mouth daily.   cetirizine (ZYRTEC) 10 MG chewable tablet Chew 10 mg by mouth daily.   diltiazem (CARDIZEM CD) 120 MG 24 hr capsule Take 1 capsule (120 mg total) by mouth daily.   fluticasone (FLONASE) 50 MCG/ACT nasal spray Place 2 sprays into both nostrils daily.   Fluticasone-Umeclidin-Vilant (TRELEGY ELLIPTA) 100-62.5-25 MCG/ACT AEPB Inhale 1 puff into the lungs daily.   HYDROcodone bit-homatropine (HYDROMET) 5-1.5 MG/5ML syrup Take 5 mLs by mouth every 6 (six) hours as needed for cough.   montelukast (SINGULAIR) 10 MG tablet Take 1 tablet (10 mg total) by mouth daily.   pantoprazole (PROTONIX) 40 MG tablet Take 1 tablet (40 mg total) by mouth daily.   venlafaxine XR (EFFEXOR-XR) 37.5 MG 24 hr capsule TAKE 1 CAPSULE BY MOUTH EVERY DAY   Vitamin D, Ergocalciferol, 50000 units CAPS TAKE 1 CAPSULE BY MOUTH EVERY SEVEN DAYS.   [DISCONTINUED] metoprolol succinate (TOPROL XL) 25 MG 24 hr tablet Take 1 tablet (25 mg total) by mouth daily.      EKGs/Labs/Other Studies Reviewed:    The following studies were reviewed today:  Cardiac Studies & Procedures      ECHOCARDIOGRAM  ECHOCARDIOGRAM COMPLETE 11/15/2022  Narrative ECHOCARDIOGRAM REPORT    Patient Name:   Brittany Short Date of Exam: 11/15/2022 Medical Rec #:   [id number]      Height:       60.0 in Accession #:    [id number]     Weight:       169.4 lb Date of Birth:  [birth year]       BSA:          1.739 m Patient Age:    67 years       BP:           124/64 mmHg Patient Gender: F              HR:           68 bpm. Exam Location:  [place]  Procedure: 2D Echo, Cardiac Doppler, Color Doppler and Strain Analysis  Indications:    SOB (shortness of breath) [R06.02 (ICD-10-CM)]  History:        Patient has prior history of Echocardiogram examinations, most recent 09/10/2020. Arrythmias:NSVT (nonsustained ventricular tachycardia), Signs/Symptoms:Chest Pain; Risk Factors:Dyslipidemia and Hypertension.  Sonographer:    Louie Boston RDCS Referring Phys: [id number] KARDIE TOBB  IMPRESSIONS   1. GS -13.1. Left ventricular ejection fraction, by estimation, is 60 to 65%. The left ventricle has  normal function. The left ventricle has no regional wall motion abnormalities. Left ventricular diastolic parameters are consistent with Grade II diastolic dysfunction (pseudonormalization). 2. Right ventricular systolic function is normal. The right ventricular size is normal. There is normal pulmonary artery systolic pressure. 3. The mitral valve is normal in structure. Mild mitral valve regurgitation. No evidence of mitral stenosis. 4. The aortic valve is normal in structure. Aortic valve regurgitation is not visualized. No aortic stenosis is present. 5. The inferior vena cava is normal in size with greater than 50% respiratory variability, suggesting right atrial pressure of 3 mmHg.  FINDINGS Left Ventricle: GS -13.1. Left ventricular ejection fraction, by estimation, is 60 to 65%. The left ventricle has normal function. The left ventricle has no regional wall motion abnormalities. The left ventricular internal cavity size was normal in size. There is no left ventricular hypertrophy. Left ventricular diastolic parameters are consistent with Grade II diastolic dysfunction  (pseudonormalization).  Right Ventricle: The right ventricular size is normal. No increase in right ventricular wall thickness. Right ventricular systolic function is normal. There is normal pulmonary artery systolic pressure. The tricuspid regurgitant velocity is 2.11 m/s, and with an assumed right atrial pressure of 3 mmHg, the estimated right ventricular systolic pressure is 20.8 mmHg.  Left Atrium: Left atrial size was normal in size.  Right Atrium: Right atrial size was normal in size.  Pericardium: There is no evidence of pericardial effusion.  Mitral Valve: The mitral valve is normal in structure. Mild mitral valve regurgitation. No evidence of mitral valve stenosis.  Tricuspid Valve: The tricuspid valve is normal in structure. Tricuspid valve regurgitation is trivial. No evidence of tricuspid stenosis.  Aortic Valve: The aortic valve is normal in structure. Aortic valve regurgitation is not visualized. No aortic stenosis is present.  Pulmonic Valve: The pulmonic valve was normal in structure. Pulmonic valve regurgitation is not visualized. No evidence of pulmonic stenosis.  Aorta: The aortic root is normal in size and structure.  Venous: The inferior vena cava is normal in size with greater than 50% respiratory variability, suggesting right atrial pressure of 3 mmHg.  IAS/Shunts: No atrial level shunt detected by color flow Doppler.   LEFT VENTRICLE PLAX 2D LVIDd:         4.10 cm   Diastology LVIDs:         2.90 cm   LV e' medial:    6.31 cm/s LV PW:         1.05 cm   LV E/e' medial:  15.4 LV IVS:        1.10 cm   LV e' lateral:   6.74 cm/s LVOT diam:     2.00 cm   LV E/e' lateral: 14.4 LV SV:         75 LV SV Index:   43 LVOT Area:     3.14 cm   RIGHT VENTRICLE             IVC RV S prime:     14.10 cm/s  IVC diam: 1.50 cm TAPSE (M-mode): 2.8 cm  LEFT ATRIUM             Index        RIGHT ATRIUM           Index LA diam:        4.10 cm 2.36 cm/m   RA Area:      10.60 cm LA Vol (A2C):   53.6 ml 30.81 ml/m  RA Volume:  19.50 ml  11.21 ml/m LA Vol (A4C):   46.3 ml 26.62 ml/m LA Biplane Vol: 50.7 ml 29.15 ml/m AORTIC VALVE LVOT Vmax:   106.00 cm/s LVOT Vmean:  72.900 cm/s LVOT VTI:    0.238 m  AORTA Ao Root diam: 2.80 cm Ao Asc diam:  3.20 cm Ao Desc diam: 2.10 cm  MITRAL VALVE               TRICUSPID VALVE MV Area (PHT): 4.15 cm    TR Peak grad:   17.8 mmHg MV Decel Time: 183 msec    TR Vmax:        211.00 cm/s MV E velocity: 97.30 cm/s MV A velocity: 63.80 cm/s  SHUNTS MV E/A ratio:  1.53        Systemic VTI:  0.24 m Systemic Diam: 2.00 cm  Gypsy Balsam MD Electronically signed by Gypsy Balsam MD Signature Date/Time: 11/15/2022/3:11:14 PM    Final   MONITORS  LONG TERM MONITOR (3-14 DAYS) 12/06/2022  Narrative Patch Wear Time:  12 days and 5 hours (2024-01-19T14:39:14-0500 to 2024-01-31T20:31:31-0500)  Patient had a min HR of 51 bpm, max HR of 203 bpm, and avg HR of 72 bpm. Predominant underlying rhythm was Sinus Rhythm.  1 run of Ventricular Tachycardia occurred lasting 5 beats with a max rate of 203 bpm (avg 199 bpm). 3 Supraventricular Tachycardia runs occurred, the run with the fastest interval lasting 5 beats with a max rate of 167 bpm, the longest lasting 12.8 secs with an avg rate of 118 bpm. Some episodes of Supraventricular Tachycardia may be possible Atrial Tachycardia with variable block.  Supraventricular Tachycardia was detected within +/- 45 seconds of symptomatic patient event(s). Isolated SVEs were rare (<1.0%), SVE Couplets were rare (<1.0%), and SVE Triplets were rare (<1.0%). Isolated VEs were rare (<1.0%), and no VE Couplets or VE Triplets were present. Ventricular Bigeminy and Trigeminy were present.  Symptoms associated with supraventricular tachycardia.  Conclusion: This study is remarkable for the following: 1.  Nonsustained ventricular tachycardia 2.  Symptomatic paroxysmal supraventricular  tachycardia.  CT SCANS  CT CORONARY FRACTIONAL FLOW RESERVE DATA PREP 10/25/2020  Narrative EXAM: CT-FFR ANALYSIS  CLINICAL DATA:  Analysis for mid-distal LAD  FINDINGS: CT-FFR analysis was performed on the original cardiac CT angiogram dataset. Diagrammatic representation of the CT-FFR analysis is provided in a separate PDF document in PACS. This dictation was created using the PDF document and an interactive 3D model of the results. 3D model is not available in the EMR/PACS. Normal FFR range is >0.80.  1. Left Main: No significant functional stenosis, CT-FFR 0.98 distal vessel.  2. LAD: No significant functional stenosis, CT-FFR 0.94 at mid-distal vessel. 3. LCX: No significant functional stenosis, CT-FFR 0.95 mid vessel. 4. RCA: No significant functional stenosis, CT-FFR 0.96 distal vessel.  IMPRESSION: 1. CT FFR analysis shows no evidence of significant functional stenosis.  Riley Lam MD   Electronically Signed By: Riley Lam MD On: 10/26/2020 08:04   CT CORONARY MORPH W/CTA COR W/SCORE 10/25/2020  Addendum 10/26/2020  8:03 AM ADDENDUM REPORT: 10/26/2020 08:01  CLINICAL DATA:  68 Year old White Female  EXAM: Cardiac/Coronary  CTA  TECHNIQUE: The patient was scanned on a [employer].  FINDINGS: A 100 kV prospective scan was triggered in the descending thoracic aorta at 111 HU's. Axial non-contrast 3 mm slices were carried out through the heart. The data set was analyzed on a dedicated work station and scored using the Agatson method. Gantry  rotation speed was 250 msecs and collimation was .6 mm. No beta blockade and 0.8 mg of sl NTG was given. The 3D data set was reconstructed in 5% intervals of the 67-82 % of the R-R cycle. Diastolic phases were analyzed on a dedicated work station using MPR, MIP and VRT modes. The patient received 80 cc of contrast.  Aorta:  Normal size.  Aortic atherosclerosis noted.  No  dissection.  Aortic Valve:  Tri-leaflet.  No calcifications.  Coronary Arteries:  Normal coronary origin.  Right dominance.  Coronary calcium score of 0. This was 1st percentile for age, sex, and race matched control.  RCA is a large dominant artery that gives rise to PDA and PLA. There is no plaque.  Left main is a large artery that gives rise to LAD and LCX arteries.  LAD is a large vessel that gives rise to one large D1 branch.  LCX is a non-dominant artery that gives rise to one large OM1 branch. There is no plaque.  Other findings:  Normal pulmonary vein drainage into the left atrium.  Incidental left atrial diverticulum noted.  Small calcification noted between aorta and left atrium.  Normal left atrial appendage without a thrombus.  Normal size of the pulmonary artery.  Extra-cardiac findings: See attached radiology report for non-cardiac structures.  IMPRESSION: 1. Coronary calcium score of 0. This was 1st percentile for age, sex, and race matched control.  2. Normal coronary origin.  Right dominance.  3. There is a disconnect between the mid and distal LAD; this may represent step-wise artifact, but sending for CT-FFR to confirm.  4. CAD-RADS 0.  No evidence of CAD.   Electronically Signed By: Riley Lam MD On: 10/26/2020 08:01  Narrative EXAM: OVER-READ INTERPRETATION  CT CHEST  The following report is an over-read performed by radiologist Dr. Trudie Reed of [facility] on 10/25/2020. This over-read does not include interpretation of cardiac or coronary anatomy or pathology. The coronary calcium score/coronary CTA interpretation by the cardiologist is attached.  COMPARISON:  None.  FINDINGS: Aortic atherosclerosis. Within the visualized portions of the thorax there are no suspicious appearing pulmonary nodules or masses, there is no acute consolidative airspace disease, no pleural effusions, no pneumothorax and  no lymphadenopathy. Visualized portions of the upper abdomen are unremarkable. There are no aggressive appearing lytic or blastic lesions noted in the visualized portions of the skeleton.  IMPRESSION: 1.  Aortic Atherosclerosis (ICD10-I70.0).  Electronically Signed: By: Trudie Reed M.D. On: 10/25/2020 09:55          EKG Interpretation Date/Time:  Wednesday November 19 2023 10:55:30 EST Ventricular Rate:  75 PR Interval:  178 QRS Duration:  80 QT Interval:  366 QTC Calculation: 408 R Axis:   -5  Text Interpretation: Normal sinus rhythm Poor R wave progression (cited on or before 19-Nov-2023) When compared with ECG of 25-Apr-2023 08:06, No significant change was found Confirmed by Brittany Short ([id number]) on 11/19/2023 10:58:21 AM   Recent Labs: 10/07/2023: ALT 18; BUN 18; Creatinine, Ser 0.64; Hemoglobin 14.1; Platelets 189; Potassium 4.6; Sodium 144; TSH 2.160  Recent Lipid Panel    Component Value Date/Time   CHOL 188 10/07/2023 0932   TRIG 106 10/07/2023 0932   HDL 58 10/07/2023 0932   CHOLHDL 3.2 10/07/2023 0932   LDLCALC 111 (H) 10/07/2023 0932    Physical Exam:    VS:  BP (!) 142/64   Pulse 84   Ht 5' (1.524 m)   Wt 173  lb (78.5 kg)   LMP  (LMP Unknown)   SpO2 94%   BMI 33.79 kg/m     Wt Readings from Last 3 Encounters:  11/19/23 173 lb (78.5 kg)  11/06/23 172 lb (78 kg)  10/27/23 171 lb 6.4 oz (77.7 kg)     GEN:  Well nourished, well developed in no acute distress HEENT: Normal NECK: No JVD; No carotid bruits LYMPHATICS: No lymphadenopathy CARDIAC: RRR, no murmurs, rubs, gallops RESPIRATORY:  Clear to auscultation without rales, wheezing or rhonchi although not bronchospastic she has a prolonged expiratory phase ABDOMEN: Soft, non-tender, non-distended MUSCULOSKELETAL:  No edema; No deformity  SKIN: Warm and dry NEUROLOGIC:  Alert and oriented x 3 PSYCHIATRIC:  Normal affect    Signed, Brittany Herrlich, MD  11/19/2023 10:52 AM    [facility]

## 2023-11-20 LAB — BASIC METABOLIC PANEL
BUN/Creatinine Ratio: 15 (ref 12–28)
BUN: 9 mg/dL (ref 8–27)
CO2: 22 mmol/L (ref 20–29)
Calcium: 9.3 mg/dL (ref 8.7–10.3)
Chloride: 104 mmol/L (ref 96–106)
Creatinine, Ser: 0.62 mg/dL (ref 0.57–1.00)
Glucose: 101 mg/dL — ABNORMAL HIGH (ref 70–99)
Potassium: 3.9 mmol/L (ref 3.5–5.2)
Sodium: 145 mmol/L — ABNORMAL HIGH (ref 134–144)
eGFR: 96 mL/min/{1.73_m2} (ref >59)

## 2023-11-25 ENCOUNTER — Telehealth: Payer: Self-pay | Admitting: Cardiology

## 2023-11-25 NOTE — Telephone Encounter (Signed)
Requesting cb to discuss referral. Dx's are not something they would treat for please review

## 2023-11-28 ENCOUNTER — Telehealth (HOSPITAL_COMMUNITY): Payer: Self-pay | Admitting: *Deleted

## 2023-11-28 NOTE — Telephone Encounter (Signed)
 Reaching out to patient to offer assistance regarding upcoming cardiac imaging study; pt verbalizes understanding of appt date/time, parking situation and where to check in, pre-test NPO status and medications ordered, and verified current allergies; name and call back number provided for further questions should they arise Johney Frame RN Navigator Cardiac Imaging [facility] [phone] office [phone] cell

## 2023-12-01 ENCOUNTER — Ambulatory Visit (HOSPITAL_COMMUNITY)
Admission: RE | Admit: 2023-12-01 | Discharge: 2023-12-01 | Disposition: A | Discharge status: Home / Self Care | Payer: HMO | Source: Ambulatory Visit | Attending: Cardiology | Admitting: Cardiology

## 2023-12-01 DIAGNOSIS — R072 Precordial pain: Secondary | ICD-10-CM | POA: Diagnosis not present

## 2023-12-01 MED ORDER — NITROGLYCERIN 0.4 MG SL SUBL
SUBLINGUAL_TABLET | SUBLINGUAL | Status: AC
  Filled 2023-12-01: qty 2

## 2023-12-01 MED ORDER — METOPROLOL TARTRATE 5 MG/5ML IV SOLN: 10.0000 mg | Freq: Once | INTRAVENOUS | Status: DC | PRN

## 2023-12-01 MED ORDER — DILTIAZEM HCL 25 MG/5ML IV SOLN: 10.0000 mg | INTRAVENOUS | Status: DC | PRN

## 2023-12-01 MED ORDER — IOHEXOL 350 MG/ML SOLN
95.0000 mL | Freq: Once | INTRAVENOUS | Status: AC | PRN
  Administered 2023-12-01: 95 mL via INTRAVENOUS

## 2023-12-01 MED ORDER — NITROGLYCERIN 0.4 MG SL SUBL
0.8000 mg | SUBLINGUAL_TABLET | Freq: Once | SUBLINGUAL | Status: AC
Start: 2023-12-01 — End: 2023-12-01
  Administered 2023-12-01: 0.8 mg via SUBLINGUAL

## 2023-12-08 NOTE — Telephone Encounter (Signed)
 Called Alexia from Allergy and Asthma - Dr. Zara Heymann office and clarified the reason for the patient's consult. Alexia stated that they were able to edit the original referral so they did not need us  to put in another referral. Alexia had no further questions at this time.

## 2023-12-09 ENCOUNTER — Encounter: Payer: Self-pay | Admitting: Physician Assistant

## 2024-01-14 ENCOUNTER — Ambulatory Visit: Payer: HMO | Admitting: Allergy and Immunology

## 2024-01-14 ENCOUNTER — Encounter: Payer: Self-pay | Admitting: Allergy and Immunology

## 2024-01-14 VITALS — BP 152/72 | HR 60 | Resp 18 | Ht 58.6 in | Wt 171.2 lb

## 2024-01-14 DIAGNOSIS — I10 Essential (primary) hypertension: Secondary | ICD-10-CM

## 2024-01-14 DIAGNOSIS — K219 Gastro-esophageal reflux disease without esophagitis: Secondary | ICD-10-CM | POA: Diagnosis not present

## 2024-01-14 DIAGNOSIS — J4540 Moderate persistent asthma, uncomplicated: Secondary | ICD-10-CM | POA: Diagnosis not present

## 2024-01-14 MED ORDER — PANTOPRAZOLE SODIUM 40 MG PO TBEC: 40.0000 mg | DELAYED_RELEASE_TABLET | Freq: Two times a day (BID) | ORAL | 5 refills | Status: DC

## 2024-01-14 MED ORDER — FAMOTIDINE 40 MG PO TABS: 40.0000 mg | ORAL_TABLET | Freq: Every day | ORAL | 5 refills | Status: AC

## 2024-01-14 MED ORDER — BREYNA 80-4.5 MCG/ACT IN AERO: INHALATION_SPRAY | RESPIRATORY_TRACT | 5 refills | Status: DC

## 2024-01-14 NOTE — Patient Instructions (Addendum)
  1. Treat and prevent inflammation of airway:   A. Symbicort 80 - 2 inhalations 2 times per day w/ spacer (empty lungs)  B. Discontinue Trelegy  2. Treat and prevent reflux induced inflammation of airway:   A. Increase Protonix to 40 mg - 2 times per day  B. Start famotidine 40 mg - in evening  C. Slowly consolidate caffeine / chocolate  D. Replace throat clearing with drinking/swallowing maneuver  3. If needed:   A. Mucinex DM (NOT "D") - 1-2 times per day  B. Albuterol - 2 inhalations every 4-6 hours  4. Return to clinic in 4 weeks or earlier if problem  5. Influenza = Tamiflu. Covid = Paxlovid  6. BP = 162/78

## 2024-01-14 NOTE — Progress Notes (Deleted)
 [place] - [place] - [place] - [place] - [place]   Follow-up Note  Referring Provider: Baldo Daub, MD Primary Provider: Marianne Sofia, Cordelia Poche Date of Office Visit: 01/14/2024  Subjective:   Brittany Short (DOB: 1956-01-24) is a 68 y.o. female who [facility] in re-evaluation of the following:  HPI:   Allergies as of 01/14/2024   No Known Allergies      Medication List        Accurate as of January 14, 2024  9:24 AM. If you have any questions, ask your nurse or doctor.          acebutolol 200 MG capsule Commonly known as: SECTRAL Take 1 capsule (200 mg total) by mouth 2 (two) times daily.   albuterol 108 (90 Base) MCG/ACT inhaler Commonly known as: VENTOLIN HFA Inhale 2 puffs into the lungs every 4 (four) hours as needed for wheezing or shortness of breath.   albuterol (2.5 MG/3ML) 0.083% nebulizer solution Commonly known as: PROVENTIL INHALE ONE VIAL VIA NEBULIZER EVERY 6 HOURS AS NEEDED FOR WHEEZING AND SHORTNESS OF BREATH   atorvastatin 10 MG tablet Commonly known as: LIPITOR Take 1 tablet (10 mg total) by mouth daily.   benzonatate 100 MG capsule Commonly known as: TESSALON Take 1 capsule (100 mg total) by mouth 2 (two) times daily as needed for cough.   celecoxib 200 MG capsule Commonly known as: CELEBREX Take 1 capsule (200 mg total) by mouth daily.   cetirizine 10 MG chewable tablet Commonly known as: ZYRTEC Chew 10 mg by mouth daily.   diltiazem 120 MG 24 hr capsule Commonly known as: CARDIZEM CD Take 1 capsule (120 mg total) by mouth daily.   fluticasone 50 MCG/ACT nasal spray Commonly known as: FLONASE Place 2 sprays into both nostrils daily.   HYDROcodone bit-homatropine 5-1.5 MG/5ML syrup Commonly known as: Hydromet Take 5 mLs by mouth every 6 (six) hours as needed for cough.   metoprolol tartrate 100 MG tablet Commonly known as: LOPRESSOR Take 1 tablet (100 mg total) by mouth once  for 1 dose. Please take this medication 2 hours before CT   montelukast 10 MG tablet Commonly known as: SINGULAIR Take 1 tablet (10 mg total) by mouth daily.   pantoprazole 40 MG tablet Commonly known as: PROTONIX Take 1 tablet (40 mg total) by mouth daily.   Trelegy Ellipta 100-62.5-25 MCG/ACT Aepb Generic drug: Fluticasone-Umeclidin-Vilant Inhale 1 puff into the lungs daily.   venlafaxine XR 37.5 MG 24 hr capsule Commonly known as: EFFEXOR-XR TAKE 1 CAPSULE BY MOUTH EVERY DAY   Vitamin D (Ergocalciferol) 50000 units Caps TAKE 1 CAPSULE BY MOUTH EVERY SEVEN DAYS.        Past Medical History:  Diagnosis Date   Acute laryngopharyngitis 10/27/2023   Angular cheilitis 10/01/2022   Aortic valve stenosis 01/08/2022   Atypical chest pain 10/27/2023   Bronchitis 09/17/2023   Chest pain of uncertain etiology 10/02/2020   Chronic cough 10/27/2023   Chronic frontal sinusitis 01/04/2020   Colon cancer screening 08/02/2020   Encounter for screening mammogram for breast cancer 02/15/2021   GERD (gastroesophageal reflux disease)    Major depressive disorder, single episode, moderate (HCC)    Mild intermittent asthma with (acute) exacerbation    Mild intermittent asthma with acute exacerbation 03/08/2023   Mild intermittent asthma without complication 10/01/2022   Mixed hyperlipidemia 10/01/2022   Morbid obesity (HCC) 10/02/2020   Need for prophylactic vaccination against Streptococcus pneumoniae (pneumococcus)  08/02/2020   Needs flu shot 08/02/2020   Nontoxic single thyroid nodule    NSVT (nonsustained ventricular tachycardia) (HCC) 10/02/2020   Palpitations 08/02/2020   PAT (paroxysmal atrial tachycardia) (HCC) 10/02/2020   Primary hypertension 01/08/2022   Shortness of breath 10/27/2023   Tremor 02/15/2021   Vitamin D deficiency 10/01/2022    Past Surgical History:  Procedure Laterality Date   CESAREAN SECTION  1982 & 1998   X2   THYROIDECTOMY Right     Review of  systems negative except as noted in HPI / PMHx or noted below:  ROS   Objective:   There were no vitals filed for this visit.        Physical Exam  Diagnostics:    Spirometry was performed and demonstrated an FEV1 of *** at *** % of predicted.  The patient had an Asthma Control Test with the following results:  .    Assessment and Plan:   No diagnosis found.  There are no Patient Instructions on file for this visit.  Laurette Schimke, MD Allergy / Immunology [facility]

## 2024-01-14 NOTE — Progress Notes (Unsigned)
 [place] - [place] - [place] - [place] - [place]   Dear Dulce Sellar,  Thank you for referring Celisa Schoenberg to the [facility] [place] on 01/14/2024.   Below is a summation of this patient's evaluation and recommendations.  Thank you for your referral. I will keep you informed about this patient's response to treatment.   If you have any questions please do not hesitate to contact me.   Sincerely,  Jessica Priest, MD Allergy / Immunology [facility]   ______________________________________________________________________    NEW PATIENT NOTE  Referring Provider: Baldo Daub, MD Primary Provider: Marianne Sofia, Cordelia Poche Date of office visit: 01/14/2024    Subjective:   Chief Complaint:  Brittany Short (DOB: [birth year]) is a 68 y.o. female who presents to the clinic on 01/14/2024 with a chief complaint of Breathing Problem .     HPI: Jalin presents to this clinic in evaluation of cough.  Her history dates back to November 2024 at which point in time she contracted "bronchitis" manifested as unrelenting cough without any fever or significant upper airway symptoms.  She coughed so bad that she could not sleep and she had posttussive emesis.  She was placed on an antibiotic and it sounds as though she has had at least 3 systemic steroids administered during this timeframe and she was also given a triple powder inhaler to use.  Currently, she still has some cough although not to the same intensity that she did in the past.  But she will still cough like crazy if she laughs or if she talks a lot.  She definitely has a lot of throat clearing and she feels mucus in her throat and she has intermittent raspy voice.  She will get short of breath with coughing but is not short of breath without coughing.  She has been given a short acting rescue inhaler and this does not appear to help her at all.  She has  no significant upper airway symptoms.  She does have reflux with regurgitation a few times a month even though she continues on Protonix every day.  She has 3 coffees per day, 1 tea per day, and chocolate most days of the week.  Past Medical History:  Diagnosis Date   Acid reflux    Acute laryngopharyngitis 10/27/2023   Angular cheilitis 10/01/2022   Aortic valve stenosis 01/08/2022   Atypical chest pain 10/27/2023   Bronchitis 09/17/2023   Chest pain of uncertain etiology 10/02/2020   Chronic cough 10/27/2023   Chronic frontal sinusitis 01/04/2020   Colon cancer screening 08/02/2020   Encounter for screening mammogram for breast cancer 02/15/2021   GERD (gastroesophageal reflux disease)    Major depressive disorder, single episode, moderate (HCC)    Mild intermittent asthma with (acute) exacerbation    Mild intermittent asthma with acute exacerbation 03/08/2023   Mild intermittent asthma without complication 10/01/2022   Mixed hyperlipidemia 10/01/2022   Morbid obesity (HCC) 10/02/2020   Need for prophylactic vaccination against Streptococcus pneumoniae (pneumococcus) 08/02/2020   Needs flu shot 08/02/2020   Nontoxic single thyroid nodule    NSVT (nonsustained ventricular tachycardia) (HCC) 10/02/2020   Palpitations 08/02/2020   PAT (paroxysmal atrial tachycardia) (HCC) 10/02/2020   Primary hypertension 01/08/2022   Shortness of breath 10/27/2023   Tremor 02/15/2021   Vitamin D deficiency 10/01/2022    Past Surgical History:  Procedure Laterality Date   CESAREAN SECTION  1982 & 1998  X2   MENISCUS REPAIR Right 2019   THYROIDECTOMY Right     Allergies as of 01/14/2024   No Known Allergies      Medication List    acebutolol 200 MG capsule Commonly known as: SECTRAL Take 1 capsule (200 mg total) by mouth 2 (two) times daily.   albuterol 108 (90 Base) MCG/ACT inhaler Commonly known as: VENTOLIN HFA Inhale 2 puffs into the lungs every 4 (four) hours as needed for  wheezing or shortness of breath.   albuterol (2.5 MG/3ML) 0.083% nebulizer solution Commonly known as: PROVENTIL INHALE ONE VIAL VIA NEBULIZER EVERY 6 HOURS AS NEEDED FOR WHEEZING AND SHORTNESS OF BREATH   atorvastatin 10 MG tablet Commonly known as: LIPITOR Take 1 tablet (10 mg total) by mouth daily.   diltiazem 120 MG 24 hr capsule Commonly known as: CARDIZEM CD Take 1 capsule (120 mg total) by mouth daily.   metoprolol tartrate 100 MG tablet Commonly known as: LOPRESSOR Take 1 tablet (100 mg total) by mouth once for 1 dose. Please take this medication 2 hours before CT   montelukast 10 MG tablet Commonly known as: SINGULAIR Take 1 tablet (10 mg total) by mouth daily.   pantoprazole 40 MG tablet Commonly known as: PROTONIX Take 1 tablet (40 mg total) by mouth daily.   Trelegy Ellipta 100-62.5-25 MCG/ACT Aepb Generic drug: Fluticasone-Umeclidin-Vilant Inhale 1 puff into the lungs daily.   venlafaxine XR 37.5 MG 24 hr capsule Commonly known as: EFFEXOR-XR TAKE 1 CAPSULE BY MOUTH EVERY DAY   Vitamin D (Ergocalciferol) 50000 units Caps TAKE 1 CAPSULE BY MOUTH EVERY SEVEN DAYS.    Review of systems negative except as noted in HPI / PMHx or noted below:  Review of Systems  Constitutional: Negative.   HENT: Negative.    Eyes: Negative.   Respiratory: Negative.    Cardiovascular: Negative.   Gastrointestinal: Negative.   Genitourinary: Negative.   Musculoskeletal: Negative.   Skin: Negative.   Neurological: Negative.   Endo/Heme/Allergies: Negative.   Psychiatric/Behavioral: Negative.      Family History  Problem Relation Age of Onset   Lung cancer Mother    COPD Mother    Heart attack Father    Depression Sister    Suicidality Sister    Lung cancer Brother    COPD Brother    Lung cancer Brother    Breast cancer Neg Hx     Social History   Socioeconomic History   Marital status: Married    Spouse name: Raiford Noble   Number of children: 2   Years of  education: Not on file   Highest education level: Not on file  Occupational History   Not on file  Tobacco Use   Smoking status: Never    Passive exposure: Past   Smokeless tobacco: Never  Vaping Use   Vaping status: Never Used  Substance and Sexual Activity   Alcohol use: Not Currently   Drug use: Never   Sexual activity: Yes    Partners: Male    Birth control/protection: None  Other Topics Concern   Not on file  Social History Narrative   Not on file   Social Drivers of Health   Financial Resource Strain: Low Risk  (04/24/2023)   Overall Financial Resource Strain (CARDIA)    Difficulty of Paying Living Expenses: Not hard at all  Food Insecurity: No Food Insecurity (04/24/2023)   Hunger Vital Sign    Worried About Running Out of Food in the Last Year: Never  true    Ran Out of Food in the Last Year: Never true  Transportation Needs: No Transportation Needs (04/24/2023)   PRAPARE - Administrator, Civil Service (Medical): No    Lack of Transportation (Non-Medical): No  Physical Activity: Insufficiently Active (04/24/2023)   Exercise Vital Sign    Days of Exercise per Week: 3 days    Minutes of Exercise per Session: 30 min  Stress: No Stress Concern Present (04/24/2023)   [employer] of Occupational Health - Occupational Stress Questionnaire    Feeling of Stress : Not at all  Social Connections: Socially Integrated (04/24/2023)   Social Connection and Isolation Panel [NHANES]    Frequency of Communication with Friends and Family: More than three times a week    Frequency of Social Gatherings with Friends and Family: Twice a week    Attends Religious Services: More than 4 times per year    Active Member of [employer] or Organizations: Yes    Attends Engineer, structural: More than 4 times per year    Marital Status: Married  Catering manager Violence: Not At Risk (04/24/2023)   Humiliation, Afraid, Rape, and Kick questionnaire    Fear of Current or  Ex-Partner: No    Emotionally Abused: No    Physically Abused: No    Sexually Abused: No    Environmental and Social history  Lives in a mobile home with a dry environment, dog located to the household, no carpet in the bedroom, plastic on the bed, plastic on the pillow, no smoking ongoing with inside household. Objective:   Vitals:   01/14/24 0937  BP: (!) 162/78  Pulse: 60  Resp: 18  SpO2: 97%   Height: 4' 10.6" (148.8 cm) Weight: 171 lb 3.2 oz (77.7 kg)  Physical Exam Constitutional:      Appearance: She is not diaphoretic.     Comments: Coughing, throat clearing, raspy voice  HENT:     Head: Normocephalic.     Right Ear: Tympanic membrane, ear canal and external ear normal.     Left Ear: Tympanic membrane, ear canal and external ear normal.     Nose: Nose normal. No mucosal edema or rhinorrhea.     Mouth/Throat:     Pharynx: Uvula midline. No oropharyngeal exudate.  Eyes:     Conjunctiva/sclera: Conjunctivae normal.  Neck:     Thyroid: No thyromegaly.     Trachea: Trachea normal. No tracheal tenderness or tracheal deviation.  Cardiovascular:     Rate and Rhythm: Normal rate and regular rhythm.     Heart sounds: Normal heart sounds, S1 normal and S2 normal. No murmur heard. Pulmonary:     Effort: No respiratory distress.     Breath sounds: Normal breath sounds. No stridor. No wheezing or rales.  Lymphadenopathy:     Head:     Right side of head: No tonsillar adenopathy.     Left side of head: No tonsillar adenopathy.     Cervical: No cervical adenopathy.  Skin:    Findings: No erythema or rash.     Nails: There is no clubbing.  Neurological:     Mental Status: She is alert.     Diagnostics: Allergy skin tests were performed.   Spirometry was performed and demonstrated an FEV1 of 1.50 @ 73 % of predicted. FEV1/FVC = 0.90  The patient had an Asthma Control Test with the following results:  .    Result of a CT coronary morphology study  obtained 01 December 2023 identifies the following:  Mediastinum/Nodes: No suspicious adenopathy identified. Imaged mediastinal structures are unremarkable.   Lungs/Pleura: There is dependent basilar subsegmental atelectasis. No pneumonia or pulmonary edema. No pleural effusion or pneumothorax.  Pericardium: Normal thickness with no significant effusion or calcium present.  Results of blood tests obtained 07 October 2023 identifies creatinine 0.64 Mg/DL, AST 16X/W, ALT 96E/A, WBC 6.3, absolute eosinophil 200, absolute lymphocyte 2600, hemoglobin 14.1, platelet 189, TSH 2.160 IU/mL   Assessment and Plan:    No diagnosis found.  Patient Instructions   1. Treat and prevent inflammation of airway:   A. Symbicort 80 - 2 inhalations 2 times per day w/ spacer (empty lungs)  B. Discontinue Trelegy  2. Treat and prevent reflux induced inflammation of airway:   A. Increase Protonix to 40 mg - 2 times per day  B. Start famotidine 40 mg - in evening  C. Slowly consolidate caffeine / chocolate  D. Replace throat clearing with drinking/swallowing maneuver  3. If needed:   A. Mucinex DM (NOT "D") - 1-2 times per day  B. Albuterol - 2 inhalations every 4-6 hours  4. Return to clinic in 4 weeks or earlier if problem  5. Influenza = Tamiflu. Covid = Paxlovid   Jessica Priest, MD Allergy / Immunology [facility] [place]

## 2024-01-15 ENCOUNTER — Encounter (HOSPITAL_BASED_OUTPATIENT_CLINIC_OR_DEPARTMENT_OTHER): Payer: Self-pay

## 2024-01-15 ENCOUNTER — Institutional Professional Consult (permissible substitution): Payer: HMO | Admitting: Pulmonary Disease

## 2024-01-15 ENCOUNTER — Ambulatory Visit (INDEPENDENT_AMBULATORY_CARE_PROVIDER_SITE_OTHER): Admit: 2024-01-15 | Discharge: 2024-01-15 | Disposition: A | Discharge status: Home / Self Care

## 2024-01-15 ENCOUNTER — Ambulatory Visit (HOSPITAL_BASED_OUTPATIENT_CLINIC_OR_DEPARTMENT_OTHER)
Admission: EM | Admit: 2024-01-15 | Discharge: 2024-01-15 | Disposition: A | Discharge status: Home / Self Care | Attending: Family Medicine | Admitting: Family Medicine

## 2024-01-15 ENCOUNTER — Encounter: Payer: Self-pay | Admitting: Allergy and Immunology

## 2024-01-15 DIAGNOSIS — M25572 Pain in left ankle and joints of left foot: Secondary | ICD-10-CM

## 2024-01-15 DIAGNOSIS — W101XXA Fall (on)(from) sidewalk curb, initial encounter: Secondary | ICD-10-CM | POA: Diagnosis not present

## 2024-01-15 DIAGNOSIS — S99912A Unspecified injury of left ankle, initial encounter: Secondary | ICD-10-CM

## 2024-01-15 NOTE — ED Triage Notes (Signed)
 Stepped off of a curb and injured left ankle. + deformity to lateral aspect of ankle. +pedal pulse. Good CMS.

## 2024-01-15 NOTE — ED Notes (Signed)
 Applied Ice to left ankle. Placed in elevated position.

## 2024-01-15 NOTE — ED Provider Notes (Signed)
 [facility]    CSN: [id number] Arrival date & time: 01/15/24  1523      History   Chief Complaint No chief complaint on file.   HPI Brittany Short is a 68 y.o. female.   68 year old female presents today with left ankle injury.  Reporting she stepped off a curb and twisted the ankle.  She since has had pain, swelling and difficulty with ambulation.  Decreased range of motion.  Denies any numbness, tingling or loss of sensation     Past Medical History:  Diagnosis Date   Acid reflux    Acute laryngopharyngitis 10/27/2023   Angular cheilitis 10/01/2022   Aortic valve stenosis 01/08/2022   Atypical chest pain 10/27/2023   Bronchitis 09/17/2023   Chest pain of uncertain etiology 10/02/2020   Chronic cough 10/27/2023   Chronic frontal sinusitis 01/04/2020   Colon cancer screening 08/02/2020   Encounter for screening mammogram for breast cancer 02/15/2021   GERD (gastroesophageal reflux disease)    Major depressive disorder, single episode, moderate (HCC)    Mild intermittent asthma with (acute) exacerbation    Mild intermittent asthma with acute exacerbation 03/08/2023   Mild intermittent asthma without complication 10/01/2022   Mixed hyperlipidemia 10/01/2022   Morbid obesity (HCC) 10/02/2020   Need for prophylactic vaccination against Streptococcus pneumoniae (pneumococcus) 08/02/2020   Needs flu shot 08/02/2020   Nontoxic single thyroid nodule    NSVT (nonsustained ventricular tachycardia) (HCC) 10/02/2020   Palpitations 08/02/2020   PAT (paroxysmal atrial tachycardia) (HCC) 10/02/2020   Primary hypertension 01/08/2022   Shortness of breath 10/27/2023   Tremor 02/15/2021   Vitamin D deficiency 10/01/2022    Patient Active Problem List   Diagnosis Date Noted   Atypical chest pain 10/27/2023   Chronic cough 10/27/2023   Shortness of breath 10/27/2023   Acute laryngopharyngitis 10/27/2023   Bronchitis 09/17/2023   Mild intermittent asthma with acute  exacerbation 03/08/2023   Vitamin D deficiency 10/01/2022   Mild intermittent asthma without complication 10/01/2022   Mixed hyperlipidemia 10/01/2022   Angular cheilitis 10/01/2022   Aortic valve stenosis 01/08/2022   Primary hypertension 01/08/2022   Tremor 02/15/2021   Encounter for screening mammogram for breast cancer 02/15/2021   NSVT (nonsustained ventricular tachycardia) (HCC) 10/02/2020   PAT (paroxysmal atrial tachycardia) (HCC) 10/02/2020   Chest pain of uncertain etiology 10/02/2020   Morbid obesity (HCC) 10/02/2020   Colon cancer screening 08/02/2020   Need for prophylactic vaccination against Streptococcus pneumoniae (pneumococcus) 08/02/2020   Needs flu shot 08/02/2020   Palpitations 08/02/2020   Nontoxic single thyroid nodule    GERD (gastroesophageal reflux disease)    Major depressive disorder, single episode, moderate (HCC)    Chronic frontal sinusitis 01/04/2020    Past Surgical History:  Procedure Laterality Date   CESAREAN SECTION  1982 & 1998   X2   MENISCUS REPAIR Right 2019   THYROIDECTOMY Right     OB History   No obstetric history on file.      Home Medications    Prior to Admission medications   Medication Sig Start Date End Date Taking? Authorizing Provider  acebutolol (SECTRAL) 200 MG capsule Take 1 capsule (200 mg total) by mouth 2 (two) times daily. 11/19/23   Baldo Daub, MD  albuterol (PROVENTIL) (2.5 MG/3ML) 0.083% nebulizer solution INHALE ONE VIAL VIA NEBULIZER EVERY 6 HOURS AS NEEDED FOR WHEEZING AND SHORTNESS OF BREATH 09/15/23   Marianne Sofia, PA-C  albuterol (VENTOLIN HFA) 108 (90 Base) MCG/ACT inhaler Inhale 2  puffs into the lungs every 4 (four) hours as needed for wheezing or shortness of breath. 02/15/21   Marianne Sofia, PA-C  atorvastatin (LIPITOR) 10 MG tablet Take 1 tablet (10 mg total) by mouth daily. 10/07/23   Marianne Sofia, PA-C  BREYNA 80-4.5 MCG/ACT inhaler Inhale two puffs with spacer twice daily to prevent cough or  wheeze.  Rinse, gargle, and spit after use. 01/14/24   Kozlow, Alvira Philips, MD  diltiazem (CARDIZEM CD) 120 MG 24 hr capsule Take 1 capsule (120 mg total) by mouth daily. 04/25/23   Baldo Daub, MD  famotidine (PEPCID) 40 MG tablet Take 1 tablet (40 mg total) by mouth at bedtime. 01/14/24   Kozlow, Alvira Philips, MD  Fluticasone-Umeclidin-Vilant (TRELEGY ELLIPTA) 100-62.5-25 MCG/ACT AEPB Inhale 1 puff into the lungs daily. 10/07/23   Marianne Sofia, PA-C  metoprolol tartrate (LOPRESSOR) 100 MG tablet Take 1 tablet (100 mg total) by mouth once for 1 dose. Please take this medication 2 hours before CT 11/19/23 11/19/23  Baldo Daub, MD  montelukast (SINGULAIR) 10 MG tablet Take 1 tablet (10 mg total) by mouth daily. 10/07/23   Marianne Sofia, PA-C  pantoprazole (PROTONIX) 40 MG tablet Take 1 tablet (40 mg total) by mouth 2 (two) times daily. 01/14/24   Kozlow, Alvira Philips, MD  venlafaxine XR (EFFEXOR-XR) 37.5 MG 24 hr capsule TAKE 1 CAPSULE BY MOUTH EVERY DAY 10/07/23   Marianne Sofia, PA-C  Vitamin D, Ergocalciferol, 50000 units CAPS TAKE 1 CAPSULE BY MOUTH EVERY SEVEN DAYS. 10/07/23   Marianne Sofia, PA-C    Family History Family History  Problem Relation Age of Onset   Lung cancer Mother    COPD Mother    Heart attack Father    Depression Sister    Suicidality Sister    Lung cancer Brother    COPD Brother    Lung cancer Brother    Breast cancer Neg Hx     Social History Social History   Tobacco Use   Smoking status: Never    Passive exposure: Past   Smokeless tobacco: Never  Vaping Use   Vaping status: Never Used  Substance Use Topics   Alcohol use: Not Currently   Drug use: Never     Allergies   Patient has no known allergies.   Review of Systems Review of Systems  See HPI Physical Exam Triage Vital Signs ED Triage Vitals [01/15/24 1535]  Encounter Vitals Group     BP (!) 158/77     Systolic BP Percentile      Diastolic BP Percentile      Pulse Rate 62     Resp 20     Temp (!) 97.4 F  (36.3 C)     Temp Source Oral     SpO2 96 %     Weight      Height      Head Circumference      Peak Flow      Pain Score 7     Pain Loc      Pain Education      Exclude from Growth Chart    No data found.  Updated Vital Signs BP (!) 158/77 (BP Location: Left Arm)   Pulse 62   Temp (!) 97.4 F (36.3 C) (Oral)   Resp 20   LMP  (LMP Unknown)   SpO2 96%   Visual Acuity Right Eye Distance:   Left Eye Distance:   Bilateral Distance:    Right Eye Near:  Left Eye Near:    Bilateral Near:     Physical Exam Musculoskeletal:        General: Swelling, tenderness and signs of injury present.     Left ankle: Swelling present. Tenderness present over the lateral malleolus. Decreased range of motion. Normal pulse.       Legs:  Skin:    General: Skin is warm and dry.  Neurological:     Mental Status: She is alert.      UC Treatments / Results  Labs (all labs ordered are listed, but only abnormal results are displayed) Labs Reviewed - No data to display  EKG   Radiology DG Ankle Complete Left Result Date: 01/15/2024 CLINICAL DATA:  Left ankle injury. EXAM: LEFT ANKLE COMPLETE - 3+ VIEW COMPARISON:  None Available. FINDINGS: There is no evidence of fracture, dislocation, or joint effusion. There is no evidence of arthropathy or other focal bone abnormality. Mild lateral soft tissue swelling is noted. IMPRESSION: No fracture or dislocation.  Mild lateral soft tissue swelling. Electronically Signed   By: Lupita Raider M.D.   On: 01/15/2024 16:25    Procedures Procedures (including critical care time)  Medications Ordered in UC Medications - No data to display  Initial Impression / Assessment and Plan / UC Course  I have reviewed the triage vital signs and the nursing notes.  Pertinent labs & imaging results that were available during my care of the patient were reviewed by me and considered in my medical decision making (see chart for details).     Left ankle  injury-x-ray without any fractures, present for soft tissue swelling. Most likely bad sprain. Ankle brace applied here in clinic and given crutches to be nonweightbearing until feeling better.  Recommended rest, ice, elevate and ibuprofen for pain and swelling as needed. Also, to follow-up with orthopedic as needed Final Clinical Impressions(s) / UC Diagnoses   Final diagnoses:  Injury of left ankle, initial encounter     Discharge Instructions      I did not see anything concerning on the x-ray.  Most likely about ankle sprain.  We have put you in a brace here and gave you some crutches to be nonweightbearing.   Recommend rest, ice, elevate the area.  Ibuprofen for pain and swelling. If symptom do not improve recommend follow-up with the orthopedic.    ED Prescriptions   None    PDMP not reviewed this encounter.   Janace Aris, FNP 01/15/24 1725

## 2024-01-15 NOTE — Discharge Instructions (Signed)
 I did not see anything concerning on the x-ray.  Most likely about ankle sprain.  We have put you in a brace here and gave you some crutches to be nonweightbearing.   Recommend rest, ice, elevate the area.  Ibuprofen for pain and swelling. If symptom do not improve recommend follow-up with the orthopedic.

## 2024-01-16 DIAGNOSIS — S8265XA Nondisplaced fracture of lateral malleolus of left fibula, initial encounter for closed fracture: Secondary | ICD-10-CM | POA: Diagnosis not present

## 2024-01-19 ENCOUNTER — Other Ambulatory Visit: Payer: Self-pay

## 2024-01-19 MED ORDER — ADVAIR HFA 115-21 MCG/ACT IN AERO: INHALATION_SPRAY | RESPIRATORY_TRACT | 5 refills | Status: DC

## 2024-01-19 NOTE — Telephone Encounter (Signed)
 Looks like generic Symbicort Jerral Ralph) is tier 4.  Tier 2 options include: Wixela and Fluticasone-salmeterol diskus. Tier 3 options include: Advair HFA brand, Breo.  Please advise.

## 2024-01-30 DIAGNOSIS — S8265XA Nondisplaced fracture of lateral malleolus of left fibula, initial encounter for closed fracture: Secondary | ICD-10-CM | POA: Diagnosis not present

## 2024-02-06 ENCOUNTER — Encounter: Payer: Self-pay | Admitting: Physician Assistant

## 2024-02-09 ENCOUNTER — Ambulatory Visit: Admitting: Physician Assistant

## 2024-02-11 DIAGNOSIS — S8265XA Nondisplaced fracture of lateral malleolus of left fibula, initial encounter for closed fracture: Secondary | ICD-10-CM | POA: Diagnosis not present

## 2024-02-18 ENCOUNTER — Encounter: Payer: Self-pay | Admitting: Allergy and Immunology

## 2024-02-18 ENCOUNTER — Ambulatory Visit: Admitting: Allergy and Immunology

## 2024-02-18 VITALS — BP 136/68 | HR 52 | Resp 14

## 2024-02-18 DIAGNOSIS — J4540 Moderate persistent asthma, uncomplicated: Secondary | ICD-10-CM | POA: Diagnosis not present

## 2024-02-18 DIAGNOSIS — K219 Gastro-esophageal reflux disease without esophagitis: Secondary | ICD-10-CM

## 2024-02-18 NOTE — Progress Notes (Unsigned)
 [place] [facility] [place] - [place] - [place] - [place] [facility]   Follow-up Note  Referring Provider: Cyndi Drain, PA-C Primary Provider: Cyndi Drain, Brittany Short Date of Office Visit: 02/18/2024  Subjective:   Brittany Short (DOB: 1956-01-01) is a 68 y.o. female who [facility] in re-evaluation of the following:  HPI: Brittany Short returns to this clinic in evaluation of asthma and LPR.  I last saw her in this clinic during her initial evaluation of 14 January 2024.  She is much better.  She has resolved her cough.  A lot of her throat clearing and raspy voice has cleared up.  She no longer has any throat burning.  She has no need to use albuterol .  She has no classic reflux symptoms.  She is lost 8 pounds of weight.  Allergies as of 02/18/2024   No Known Allergies      Medication List    acebutolol  200 MG capsule Commonly known as: SECTRAL  Take 1 capsule (200 mg total) by mouth 2 (two) times daily.   albuterol  108 (90 Base) MCG/ACT inhaler Commonly known as: VENTOLIN  HFA Inhale 2 puffs into the lungs every 4 (four) hours as needed for wheezing or shortness of breath.   albuterol  (2.5 MG/3ML) 0.083% nebulizer solution Commonly known as: PROVENTIL  INHALE ONE VIAL VIA NEBULIZER EVERY 6 HOURS AS NEEDED FOR WHEEZING AND SHORTNESS OF BREATH   diltiazem  120 MG 24 hr capsule Commonly known as: CARDIZEM  CD Take 1 capsule (120 mg total) by mouth daily.   famotidine  40 MG tablet Commonly known as: PEPCID  Take 1 tablet (40 mg total) by mouth at bedtime.   montelukast  10 MG tablet Commonly known as: SINGULAIR  Take 1 tablet (10 mg total) by mouth daily.   pantoprazole  40 MG tablet Commonly known as: PROTONIX  Take 1 tablet (40 mg total) by mouth 2 (two) times daily.   Symbicort 80-4.5 MCG/ACT inhaler Generic drug: budesonide-formoterol Inhale 2 puffs into the lungs 2 (two) times daily.   venlafaxine  XR 37.5 MG 24 hr capsule Commonly known as:  EFFEXOR -XR TAKE 1 CAPSULE BY MOUTH EVERY DAY   Vitamin D  (Ergocalciferol ) 50000 units Caps TAKE 1 CAPSULE BY MOUTH EVERY SEVEN DAYS.    Past Medical History:  Diagnosis Date   Acid reflux    Acute laryngopharyngitis 10/27/2023   Angular cheilitis 10/01/2022   Aortic valve stenosis 01/08/2022   Atypical chest pain 10/27/2023   Bronchitis 09/17/2023   Chest pain of uncertain etiology 10/02/2020   Chronic cough 10/27/2023   Chronic frontal sinusitis 01/04/2020   Colon cancer screening 08/02/2020   Encounter for screening mammogram for breast cancer 02/15/2021   GERD (gastroesophageal reflux disease)    Major depressive disorder, single episode, moderate (HCC)    Mild intermittent asthma with (acute) exacerbation    Mild intermittent asthma with acute exacerbation 03/08/2023   Mild intermittent asthma without complication 10/01/2022   Mixed hyperlipidemia 10/01/2022   Morbid obesity (HCC) 10/02/2020   Need for prophylactic vaccination against Streptococcus pneumoniae (pneumococcus) 08/02/2020   Needs flu shot 08/02/2020   Nontoxic single thyroid  nodule    NSVT (nonsustained ventricular tachycardia) (HCC) 10/02/2020   Palpitations 08/02/2020   PAT (paroxysmal atrial tachycardia) (HCC) 10/02/2020   Primary hypertension 01/08/2022   Shortness of breath 10/27/2023   Tremor 02/15/2021   Vitamin D  deficiency 10/01/2022    Past Surgical History:  Procedure Laterality Date   CESAREAN SECTION  1982 & 1998   X2   MENISCUS REPAIR  Right 2019   THYROIDECTOMY Right     Review of systems negative except as noted in HPI / PMHx or noted below:  Review of Systems  Constitutional: Negative.   HENT: Negative.    Eyes: Negative.   Respiratory: Negative.    Cardiovascular: Negative.   Gastrointestinal: Negative.   Genitourinary: Negative.   Musculoskeletal: Negative.   Skin: Negative.   Neurological: Negative.   Endo/Heme/Allergies: Negative.   Psychiatric/Behavioral: Negative.        Objective:   Vitals:   02/18/24 1027  BP: 136/68  Pulse: (!) 52  Resp: 14  SpO2: 97%          Physical Exam Constitutional:      Appearance: She is not diaphoretic.  HENT:     Head: Normocephalic.     Right Ear: Tympanic membrane, ear canal and external ear normal.     Left Ear: Tympanic membrane, ear canal and external ear normal.     Nose: Nose normal. No mucosal edema or rhinorrhea.     Mouth/Throat:     Pharynx: Uvula midline. No oropharyngeal exudate.  Eyes:     Conjunctiva/sclera: Conjunctivae normal.  Neck:     Thyroid : No thyromegaly.     Trachea: Trachea normal. No tracheal tenderness or tracheal deviation.  Cardiovascular:     Rate and Rhythm: Normal rate and regular rhythm.     Heart sounds: Normal heart sounds, S1 normal and S2 normal. No murmur heard. Pulmonary:     Effort: No respiratory distress.     Breath sounds: Normal breath sounds. No stridor. No wheezing or rales.  Lymphadenopathy:     Head:     Right side of head: No tonsillar adenopathy.     Left side of head: No tonsillar adenopathy.     Cervical: No cervical adenopathy.  Skin:    Findings: No erythema or rash.     Nails: There is no clubbing.  Neurological:     Mental Status: She is alert.     Diagnostics: Spirometry was performed and demonstrated an FEV1 of 1.46 at 79 % of predicted.  Assessment and Plan:   1. Asthma, moderate persistent, well-controlled   2. LPRD (laryngopharyngeal reflux disease)    1. Treat and prevent inflammation of airway:   A. Symbicort 80 - 2 inhalations 2 times per day w/ spacer (empty lungs)  B. Attempt to discontinue montelukast   2. Treat and prevent reflux induced inflammation of airway:   A. Protonix  to 40 mg - 2 times per day  B. Famotidine  40 mg - in evening  C. Always aim to consolidate caffeine / chocolate  D. Replace throat clearing with drinking/swallowing maneuver  3. If needed:   A. Mucinex  DM - 1-2 times per day  B. Symbicort -  2 inhalations every 6 hours (replaces albuterol )  4. Return to clinic in 8 weeks or earlier if problem. Taper medications???  5. Influenza = Tamiflu. Covid = Paxlovid  6. Consistent progressive exercise program  Brittany Short is doing a lot better with her airway while addressing inflammation and LPR.  We will attempt to have her discontinue her montelukast .  Will have her use Symbicort as a anti-inflammatory rescue inhaler in addition to a controller agent.  I will see her back in this clinic in 8 weeks and there will probably be an opportunity to consolidate her therapy further at that point.  I also had a talk with her today about engaging in an exercise program to help with  her hypertension.  Schuyler Custard, MD Allergy / Immunology [facility]

## 2024-02-18 NOTE — Patient Instructions (Addendum)
  1. Treat and prevent inflammation of airway:   A. Symbicort 80 - 2 inhalations 2 times per day w/ spacer (empty lungs)  B. Attempt to discontinue montelukast   2. Treat and prevent reflux induced inflammation of airway:   A. Protonix  to 40 mg - 2 times per day  B. Famotidine  40 mg - in evening  C. Always aim to consolidate caffeine / chocolate  D. Replace throat clearing with drinking/swallowing maneuver  3. If needed:   A. Mucinex  DM - 1-2 times per day  B. Symbicort - 2 inhalations every 6 hours (replaces albuterol )  4. Return to clinic in 8 weeks or earlier if problem. Taper medications???  5. Influenza = Tamiflu. Covid = Paxlovid  6. Consistent progressive exercise program

## 2024-02-19 ENCOUNTER — Encounter: Payer: Self-pay | Admitting: Allergy and Immunology

## 2024-04-07 ENCOUNTER — Ambulatory Visit (INDEPENDENT_AMBULATORY_CARE_PROVIDER_SITE_OTHER): Payer: Medicare HMO | Admitting: Physician Assistant

## 2024-04-07 ENCOUNTER — Encounter: Payer: Self-pay | Admitting: Physician Assistant

## 2024-04-07 VITALS — BP 130/62 | HR 60 | Temp 97.5°F | Ht 58.6 in | Wt 162.0 lb

## 2024-04-07 DIAGNOSIS — E782 Mixed hyperlipidemia: Secondary | ICD-10-CM | POA: Diagnosis not present

## 2024-04-07 DIAGNOSIS — J4520 Mild intermittent asthma, uncomplicated: Secondary | ICD-10-CM | POA: Diagnosis not present

## 2024-04-07 DIAGNOSIS — Z1211 Encounter for screening for malignant neoplasm of colon: Secondary | ICD-10-CM | POA: Diagnosis not present

## 2024-04-07 DIAGNOSIS — K219 Gastro-esophageal reflux disease without esophagitis: Secondary | ICD-10-CM

## 2024-04-07 DIAGNOSIS — E559 Vitamin D deficiency, unspecified: Secondary | ICD-10-CM

## 2024-04-07 DIAGNOSIS — R739 Hyperglycemia, unspecified: Secondary | ICD-10-CM | POA: Diagnosis not present

## 2024-04-07 DIAGNOSIS — Z1231 Encounter for screening mammogram for malignant neoplasm of breast: Secondary | ICD-10-CM | POA: Diagnosis not present

## 2024-04-07 DIAGNOSIS — I4729 Other ventricular tachycardia: Secondary | ICD-10-CM

## 2024-04-07 DIAGNOSIS — Z6833 Body mass index (BMI) 33.0-33.9, adult: Secondary | ICD-10-CM | POA: Diagnosis not present

## 2024-04-07 DIAGNOSIS — F321 Major depressive disorder, single episode, moderate: Secondary | ICD-10-CM | POA: Diagnosis not present

## 2024-04-07 NOTE — Progress Notes (Addendum)
 Established Patient Office Visit  Subjective:  [name], female    DOB: February 25, 1956  Age: 68 y.o. MRN: [id number]  CC:  Chief Complaint  Patient presents with   Medical Management of Chronic Issues    HPI Brittany Short presents for chronic follow up  Pt is now seeing cardiology and diagnosed with PAT - currently on acebutolol  200mg  and cardizem  CD 120mg -  and that is controlling symptoms well - she follows with Dr Sandee Crook regularly and has appt next week  Pt with  anxiety - symptoms are stable on effexor  xr 37.5mg  and voices no concerns or problems-  Pt with GERD - symptoms stable on protonix  40mg  qd and pepcid  40mg   Pt with vit D def - is taking weekly supplement - due to repeat labwork  Pt with asthma/copd - currently taking symbicort, singulair , albuterol  - voices no concerns or problems today  Pt due for cologuard  Pt due for mammogram later in summer - would like to schedule  Past Medical History:  Diagnosis Date   Acid reflux    Acute laryngopharyngitis 10/27/2023   Angular cheilitis 10/01/2022   Aortic valve stenosis 01/08/2022   Atypical chest pain 10/27/2023   Bronchitis 09/17/2023   Chest pain of uncertain etiology 10/02/2020   Chronic cough 10/27/2023   Chronic frontal sinusitis 01/04/2020   Colon cancer screening 08/02/2020   Encounter for screening mammogram for breast cancer 02/15/2021   GERD (gastroesophageal reflux disease)    Major depressive disorder, single episode, moderate (HCC)    Mild intermittent asthma with (acute) exacerbation    Mild intermittent asthma with acute exacerbation 03/08/2023   Mild intermittent asthma without complication 10/01/2022   Mixed hyperlipidemia 10/01/2022   Morbid obesity (HCC) 10/02/2020   Need for prophylactic vaccination against Streptococcus pneumoniae (pneumococcus) 08/02/2020   Needs flu shot 08/02/2020   Nontoxic single thyroid  nodule    NSVT (nonsustained ventricular tachycardia) (HCC)  10/02/2020   Palpitations 08/02/2020   PAT (paroxysmal atrial tachycardia) (HCC) 10/02/2020   Primary hypertension 01/08/2022   Shortness of breath 10/27/2023   Tremor 02/15/2021   Vitamin D  deficiency 10/01/2022    Past Surgical History:  Procedure Laterality Date   CESAREAN SECTION  1982 & 1998   X2   MENISCUS REPAIR Right 2019   THYROIDECTOMY Right     Family History  Problem Relation Age of Onset   Lung cancer Mother    COPD Mother    Heart attack Father    Depression Sister    Suicidality Sister    Lung cancer Brother    COPD Brother    Lung cancer Brother    Breast cancer Neg Hx     Social History   Socioeconomic History   Marital status: Married    Spouse name: Sharin David   Number of children: 2   Years of education: Not on file   Highest education level: Not on file  Occupational History   Not on file  Tobacco Use   Smoking status: Never    Passive exposure: Past   Smokeless tobacco: Never  Vaping Use   Vaping status: Never Used  Substance and Sexual Activity   Alcohol use: Not Currently   Drug use: Never   Sexual activity: Yes    Partners: Male    Birth control/protection: None  Other Topics Concern   Not on file  Social History Narrative   Not on file   Social Drivers of Health   Financial Resource Strain: Low  Risk  (04/24/2023)   Overall Financial Resource Strain (CARDIA)    Difficulty of Paying Living Expenses: Not hard at all  Food Insecurity: No Food Insecurity (04/24/2023)   Hunger Vital Sign    Worried About Running Out of Food in the Last Year: Never true    Ran Out of Food in the Last Year: Never true  Transportation Needs: No Transportation Needs (04/24/2023)   PRAPARE - Administrator, Civil Service (Medical): No    Lack of Transportation (Non-Medical): No  Physical Activity: Insufficiently Active (04/24/2023)   Exercise Vital Sign    Days of Exercise per Week: 3 days    Minutes of Exercise per Session: 30 min  Stress: No  Stress Concern Present (04/24/2023)   [employer] of Occupational Health - Occupational Stress Questionnaire    Feeling of Stress : Not at all  Social Connections: Socially Integrated (04/24/2023)   Social Connection and Isolation Panel [NHANES]    Frequency of Communication with Friends and Family: More than three times a week    Frequency of Social Gatherings with Friends and Family: Twice a week    Attends Religious Services: More than 4 times per year    Active Member of [employer] or Organizations: Yes    Attends Engineer, structural: More than 4 times per year    Marital Status: Married  Catering manager Violence: Not At Risk (04/24/2023)   Humiliation, Afraid, Rape, and Kick questionnaire    Fear of Current or Ex-Partner: No    Emotionally Abused: No    Physically Abused: No    Sexually Abused: No     Current Outpatient Medications:    acebutolol  (SECTRAL ) 200 MG capsule, Take 1 capsule (200 mg total) by mouth 2 (two) times daily., Disp: 180 capsule, Rfl: 3   albuterol  (PROVENTIL ) (2.5 MG/3ML) 0.083% nebulizer solution, INHALE ONE VIAL VIA NEBULIZER EVERY 6 HOURS AS NEEDED FOR WHEEZING AND SHORTNESS OF BREATH, Disp: 75 mL, Rfl: 3   albuterol  (VENTOLIN  HFA) 108 (90 Base) MCG/ACT inhaler, Inhale 2 puffs into the lungs every 4 (four) hours as needed for wheezing or shortness of breath., Disp: 18 g, Rfl: 2   budesonide-formoterol (SYMBICORT) 80-4.5 MCG/ACT inhaler, Inhale 2 puffs into the lungs 2 (two) times daily., Disp: , Rfl:    diltiazem  (CARDIZEM  CD) 120 MG 24 hr capsule, Take 1 capsule (120 mg total) by mouth daily., Disp: 90 capsule, Rfl: 3   famotidine  (PEPCID ) 40 MG tablet, Take 1 tablet (40 mg total) by mouth at bedtime., Disp: 30 tablet, Rfl: 5   pantoprazole  (PROTONIX ) 40 MG tablet, Take 1 tablet (40 mg total) by mouth 2 (two) times daily., Disp: 60 tablet, Rfl: 5   venlafaxine  XR (EFFEXOR -XR) 37.5 MG 24 hr capsule, TAKE 1 CAPSULE BY MOUTH EVERY DAY, Disp: 90  capsule, Rfl: 1   Vitamin D , Ergocalciferol , 50000 units CAPS, TAKE 1 CAPSULE BY MOUTH EVERY SEVEN DAYS., Disp: 12 capsule, Rfl: 1   montelukast  (SINGULAIR ) 10 MG tablet, Take 1 tablet (10 mg total) by mouth daily. (Patient not taking: Reported on 04/07/2024), Disp: 90 tablet, Rfl: 1   No Known Allergies CONSTITUTIONAL: Negative for chills, fatigue, fever, unintentional weight gain and unintentional weight loss.  E/N/T: Negative for ear pain, nasal congestion and sore throat.  CARDIOVASCULAR: Negative for chest pain, dizziness, palpitations and pedal edema.  RESPIRATORY: Negative for recent cough and dyspnea.  GASTROINTESTINAL: Negative for abdominal pain, acid reflux symptoms, constipation, diarrhea, nausea and vomiting.  MSK:  Negative for arthralgias and myalgias.  INTEGUMENTARY: Negative for rash.  NEUROLOGICAL: Negative for dizziness and headaches.  PSYCHIATRIC: Negative for sleep disturbance and to question depression screen.  Negative for depression, negative for anhedonia.       Objective:  PHYSICAL EXAM:   VS: BP 130/62   Pulse 60   Temp (!) 97.5 F (36.4 C)   Ht 4' 10.6 (1.488 m)   Wt 162 lb (73.5 kg)   LMP  (LMP Unknown)   SpO2 98%   BMI 33.17 kg/m   GEN: Well nourished, well developed, in no acute distress   Cardiac: RRR; no murmurs, rubs, or gallops,no edema -  Respiratory:  normal respiratory rate and pattern with no distress - normal breath sounds with no rales, rhonchi, wheezes or rubs  MS: no deformity or atrophy  Skin: warm and dry, no rash  Neuro:  Alert and Oriented x 3, - CN II-Xii grossly intact Psych: euthymic mood, appropriate affect and demeanor    Health Maintenance Due  Topic Date Due   Fecal DNA (Cologuard)  12/26/2023   Medicare Annual Wellness (AWV)  04/23/2024    There are no preventive care reminders to display for this patient.  Lab Results  Component Value Date   TSH 2.160 10/07/2023   Lab Results  Component Value Date   WBC  6.3 10/07/2023   HGB 14.1 10/07/2023   HCT 43.3 10/07/2023   MCV 93 10/07/2023   PLT 189 10/07/2023   Lab Results  Component Value Date   NA 145 (H) 11/19/2023   K 3.9 11/19/2023   CO2 22 11/19/2023   GLUCOSE 101 (H) 11/19/2023   BUN 9 11/19/2023   CREATININE 0.62 11/19/2023   BILITOT 0.4 10/07/2023   ALKPHOS 93 10/07/2023   AST 16 10/07/2023   ALT 18 10/07/2023   PROT 6.8 10/07/2023   ALBUMIN 4.3 10/07/2023   CALCIUM  9.3 11/19/2023   EGFR 96 11/19/2023   Lab Results  Component Value Date   CHOL 188 10/07/2023   Lab Results  Component Value Date   HDL 58 10/07/2023   Lab Results  Component Value Date   LDLCALC 111 (H) 10/07/2023   Lab Results  Component Value Date   TRIG 106 10/07/2023   Lab Results  Component Value Date   CHOLHDL 3.2 10/07/2023   No results found for: HGBA1C    Assessment & Plan:   Problem List Items Addressed This Visit       Cardiovascular and Mediastinum   NSVT (nonsustained ventricular tachycardia) (HCC) - Primary   Relevant Orders   CBC with Differential/Platelet   Comprehensive metabolic panel   Lipid panel TSH Continue current meds and follow up with cardiology as directed      Digestive   GERD (gastroesophageal reflux disease)   Relevant Medications   pantoprazole  (PROTONIX ) 40 MG tablet        Mild intermittent asthma without complication Continue current meds      Hyperglycemia Watch diet Hgb A1c pending  BMI 33 Efforts at diet/exercise     Other   Major depressive disorder, single episode, moderate (HCC)   Relevant Medications   venlafaxine  XR (EFFEXOR -XR) 37.5 MG 24 hr capsule    Vit D def Labwork pending Continue med     Colon cancer screening Cologuard ordered  Breast cancer screening Mammogram ordered       Vitamin D  insufficiency       Relevant Orders   VITAMIN D  25 Hydroxy (Vit-D Deficiency,  Fractures)       No orders of the defined types were placed in this  encounter.  Follow-up: Return in about 6 months (around 10/07/2024) for chronic fasting follow-up.    SARA R Annella Prowell, PA-C

## 2024-04-08 ENCOUNTER — Other Ambulatory Visit: Payer: Self-pay | Admitting: Physician Assistant

## 2024-04-08 ENCOUNTER — Ambulatory Visit: Payer: Self-pay | Admitting: Physician Assistant

## 2024-04-08 DIAGNOSIS — Z6833 Body mass index (BMI) 33.0-33.9, adult: Secondary | ICD-10-CM | POA: Insufficient documentation

## 2024-04-08 DIAGNOSIS — R899 Unspecified abnormal finding in specimens from other organs, systems and tissues: Secondary | ICD-10-CM

## 2024-04-08 DIAGNOSIS — R739 Hyperglycemia, unspecified: Secondary | ICD-10-CM | POA: Insufficient documentation

## 2024-04-08 LAB — VITAMIN D 25 HYDROXY (VIT D DEFICIENCY, FRACTURES): Vit D, 25-Hydroxy: 52.6 ng/mL (ref 30.0–100.0)

## 2024-04-08 LAB — CBC WITH DIFFERENTIAL/PLATELET
Basophils Absolute: 0 10*3/uL (ref 0.0–0.2)
Basos: 1 %
EOS (ABSOLUTE): 0.3 10*3/uL (ref 0.0–0.4)
Eos: 5 %
Hematocrit: 43.8 % (ref 34.0–46.6)
Hemoglobin: 14.1 g/dL (ref 11.1–15.9)
Immature Grans (Abs): 0 10*3/uL (ref 0.0–0.1)
Immature Granulocytes: 0 %
Lymphocytes Absolute: 2.4 10*3/uL (ref 0.7–3.1)
Lymphs: 38 %
MCH: 29.9 pg (ref 26.6–33.0)
MCHC: 32.2 g/dL (ref 31.5–35.7)
MCV: 93 fL (ref 79–97)
Monocytes Absolute: 0.4 10*3/uL (ref 0.1–0.9)
Monocytes: 7 %
Neutrophils Absolute: 3.2 10*3/uL (ref 1.4–7.0)
Neutrophils: 49 %
Platelets: 231 10*3/uL (ref 150–450)
RBC: 4.71 x10E6/uL (ref 3.77–5.28)
RDW: 12.8 % (ref 11.7–15.4)
WBC: 6.4 10*3/uL (ref 3.4–10.8)

## 2024-04-08 LAB — COMPREHENSIVE METABOLIC PANEL WITH GFR
ALT: 17 IU/L (ref 0–32)
AST: 18 IU/L (ref 0–40)
Albumin: 4.3 g/dL (ref 3.9–4.9)
Alkaline Phosphatase: 84 IU/L (ref 44–121)
BUN/Creatinine Ratio: 17 (ref 12–28)
BUN: 13 mg/dL (ref 8–27)
Bilirubin Total: 0.4 mg/dL (ref 0.0–1.2)
CO2: 20 mmol/L (ref 20–29)
Calcium: 9.6 mg/dL (ref 8.7–10.3)
Chloride: 106 mmol/L (ref 96–106)
Creatinine, Ser: 0.77 mg/dL (ref 0.57–1.00)
Globulin, Total: 2.2 g/dL (ref 1.5–4.5)
Glucose: 105 mg/dL — ABNORMAL HIGH (ref 70–99)
Potassium: 4.1 mmol/L (ref 3.5–5.2)
Sodium: 141 mmol/L (ref 134–144)
Total Protein: 6.5 g/dL (ref 6.0–8.5)
eGFR: 83 mL/min/{1.73_m2} (ref >59)

## 2024-04-08 LAB — LIPID PANEL
Chol/HDL Ratio: 4.2 ratio (ref 0.0–4.4)
Cholesterol, Total: 205 mg/dL — ABNORMAL HIGH (ref 100–199)
HDL: 49 mg/dL (ref >39)
LDL Chol Calc (NIH): 132 mg/dL — ABNORMAL HIGH (ref 0–99)
Triglycerides: 134 mg/dL (ref 0–149)
VLDL Cholesterol Cal: 24 mg/dL (ref 5–40)

## 2024-04-08 LAB — HEMOGLOBIN A1C
Est. average glucose Bld gHb Est-mCnc: 117 mg/dL
Hgb A1c MFr Bld: 5.7 % — ABNORMAL HIGH (ref 4.8–5.6)

## 2024-04-08 LAB — TSH: TSH: 4.7 u[IU]/mL — ABNORMAL HIGH (ref 0.450–4.500)

## 2024-04-15 ENCOUNTER — Ambulatory Visit: Admitting: Allergy and Immunology

## 2024-04-15 VITALS — BP 132/74 | HR 56 | Resp 20

## 2024-04-15 DIAGNOSIS — K219 Gastro-esophageal reflux disease without esophagitis: Secondary | ICD-10-CM

## 2024-04-15 DIAGNOSIS — J4540 Moderate persistent asthma, uncomplicated: Secondary | ICD-10-CM

## 2024-04-15 DIAGNOSIS — J3089 Other allergic rhinitis: Secondary | ICD-10-CM | POA: Diagnosis not present

## 2024-04-15 NOTE — Progress Notes (Unsigned)
 [place] - [place] - [place] - [place] - [place]   Follow-up Note  Referring Provider: Cyndi Drain, PA-C Primary Provider: Cyndi Drain, Kirby Peoples Date of Office Visit: 04/15/2024  Subjective:   Brittany Short (DOB: Sep 15, 1956) is a 68 y.o. female who [facility] 04[facility]/2024 in re-evaluation of the following:  HPI: Uri presents to this clinic in evaluation of LPR, asthma, rhinitis.  I last saw her in this clinic 18 February 2024.  She has no cough.  She has no significant lower respiratory tract symptoms.  Occasionally she gets a little itchy in her nose and some runny nose and she will take a Claritin .  She has resolved the issue with her throat burning.  She does not have any classic reflux symptoms.  When I last saw her in this clinic we attempted to discontinue her montelukast .  She continued to do well with discontinuation of that agent.  She is slowly tapered off her Symbicort and now only uses her Symbicort as needed which is relatively rare.  She does continue to use Protonix  twice a day and famotidine  in the evening.  She has lost 12 pounds of weight since have last seen her in this clinic.  Allergies as of 04/15/2024   No Known Allergies      Medication List    acebutolol  200 MG capsule Commonly known as: SECTRAL  Take 1 capsule (200 mg total) by mouth 2 (two) times daily.   albuterol  108 (90 Base) MCG/ACT inhaler Commonly known as: VENTOLIN  HFA Inhale 2 puffs into the lungs every 4 (four) hours as needed for wheezing or shortness of breath.   albuterol  (2.5 MG/3ML) 0.083% nebulizer solution Commonly known as: PROVENTIL  INHALE ONE VIAL VIA NEBULIZER EVERY 6 HOURS AS NEEDED FOR WHEEZING AND SHORTNESS OF BREATH   diltiazem  120 MG 24 hr capsule Commonly known as: CARDIZEM  CD Take 1 capsule (120 mg total) by mouth daily.   famotidine  40 MG tablet Commonly known as: PEPCID  Take 1 tablet (40 mg total) by mouth at bedtime.    pantoprazole  40 MG tablet Commonly known as: PROTONIX  Take 1 tablet (40 mg total) by mouth 2 (two) times daily.   Symbicort 80-4.5 MCG/ACT inhaler Generic drug: budesonide-formoterol Inhale 2 puffs into the lungs 2 (two) times daily.   venlafaxine  XR 37.5 MG 24 hr capsule Commonly known as: EFFEXOR -XR TAKE 1 CAPSULE BY MOUTH EVERY DAY   Vitamin D  (Ergocalciferol ) 50000 units Caps TAKE 1 CAPSULE BY MOUTH EVERY SEVEN DAYS.    Past Medical History:  Diagnosis Date   Acid reflux    Acute laryngopharyngitis 10/27/2023   Angular cheilitis 10/01/2022   Aortic valve stenosis 01/08/2022   Atypical chest pain 10/27/2023   Bronchitis 09/17/2023   Chest pain of uncertain etiology 10/02/2020   Chronic cough 10/27/2023   Chronic frontal sinusitis 01/04/2020   Colon cancer screening 08/02/2020   Encounter for screening mammogram for breast cancer 02/15/2021   GERD (gastroesophageal reflux disease)    Major depressive disorder, single episode, moderate (HCC)    Mild intermittent asthma with (acute) exacerbation    Mild intermittent asthma with acute exacerbation 03/08/2023   Mild intermittent asthma without complication 10/01/2022   Mixed hyperlipidemia 10/01/2022   Morbid obesity (HCC) 10/02/2020   Need for prophylactic vaccination against Streptococcus pneumoniae (pneumococcus) 08/02/2020   Needs flu shot 08/02/2020   Nontoxic single thyroid  nodule    NSVT (nonsustained ventricular tachycardia) (HCC) 10/02/2020   Palpitations 08/02/2020   PAT (  paroxysmal atrial tachycardia) (HCC) 10/02/2020   Primary hypertension 01/08/2022   Shortness of breath 10/27/2023   Tremor 02/15/2021   Vitamin D  deficiency 10/01/2022    Past Surgical History:  Procedure Laterality Date   CESAREAN SECTION  1982 & 1998   X2   MENISCUS REPAIR Right 2019   THYROIDECTOMY Right     Review of systems negative except as noted in HPI / PMHx or noted below:  Review of Systems  Constitutional: Negative.    HENT: Negative.    Eyes: Negative.   Respiratory: Negative.    Cardiovascular: Negative.   Gastrointestinal: Negative.   Genitourinary: Negative.   Musculoskeletal: Negative.   Skin: Negative.   Neurological: Negative.   Endo/Heme/Allergies: Negative.   Psychiatric/Behavioral: Negative.       Objective:   Vitals:   04/15/24 1013  BP: 132/74  Pulse: (!) 56  Resp: 20  SpO2: 96%          Physical Exam Constitutional:      Appearance: She is not diaphoretic.  HENT:     Head: Normocephalic.     Right Ear: Tympanic membrane, ear canal and external ear normal.     Left Ear: Tympanic membrane, ear canal and external ear normal.     Nose: Nose normal. No mucosal edema or rhinorrhea.     Mouth/Throat:     Pharynx: Uvula midline. No oropharyngeal exudate.   Eyes:     Conjunctiva/sclera: Conjunctivae normal.   Neck:     Thyroid : No thyromegaly.     Trachea: Trachea normal. No tracheal tenderness or tracheal deviation.   Cardiovascular:     Rate and Rhythm: Normal rate and regular rhythm.     Heart sounds: Normal heart sounds, S1 normal and S2 normal. No murmur heard. Pulmonary:     Effort: No respiratory distress.     Breath sounds: Normal breath sounds. No stridor. No wheezing or rales.  Lymphadenopathy:     Head:     Right side of head: No tonsillar adenopathy.     Left side of head: No tonsillar adenopathy.     Cervical: No cervical adenopathy.   Skin:    Findings: No erythema or rash.     Nails: There is no clubbing.   Neurological:     Mental Status: She is alert.     Diagnostics: Spirometry was performed and demonstrated an FEV1 of 1.39 at 75 % of predicted.  Assessment and Plan:   1. Asthma, moderate persistent, well-controlled   2. LPRD (laryngopharyngeal reflux disease)   3. Other allergic rhinitis    1. Continue to Treat and prevent reflux induced inflammation of airway:   A. Protonix  to 40 mg - 2 times per day  B. Famotidine  40 mg - in  evening IF NEEDED  C. Always aim to consolidate caffeine / chocolate  D. Replace throat clearing with drinking/swallowing maneuver  2. If needed:   A. Mucinex  DM    B. OTC loratadine    c. Symbicort - 2 inhalations every 6 hours   3. Return to clinic in 12 weeks or earlier if problem. Taper medications???  4. Influenza = Tamiflu. Covid = Paxlovid  We are slowly consolidating Brittany Short's medical plan.  One of the big insults to her respiratory tract was reflux and now that that is treated aggressively she has really done very well regarding her coughing and throat issues even as she tapers off all of her medications for her lower airway inflammation.  At this  point we will see if she does well while we eliminate famotidine  while maintaining Protonix  twice a day and she can use her Symbicort and loratadine  and Mucinex  as needed.  I will see her back in this clinic in 12 weeks.   Schuyler Custard, MD Allergy / Immunology [facility]

## 2024-04-15 NOTE — Patient Instructions (Addendum)
  1. Continue to Treat and prevent reflux induced inflammation of airway:   A. Protonix  to 40 mg - 2 times per day  B. Famotidine  40 mg - in evening IF NEEDED  C. Always aim to consolidate caffeine / chocolate  D. Replace throat clearing with drinking/swallowing maneuver  2. If needed:   A. Mucinex  DM    B. OTC loratadine    c. Symbicort - 2 inhalations every 6 hours   3. Return to clinic in 12 weeks or earlier if problem. Taper medications???  4. Influenza = Tamiflu. Covid = Paxlovid

## 2024-04-18 DIAGNOSIS — Z1211 Encounter for screening for malignant neoplasm of colon: Secondary | ICD-10-CM | POA: Diagnosis not present

## 2024-04-19 ENCOUNTER — Encounter: Payer: Self-pay | Admitting: Allergy and Immunology

## 2024-04-20 MED ORDER — ATORVASTATIN CALCIUM 10 MG PO TABS: 10.0000 mg | ORAL_TABLET | Freq: Every day | ORAL | 0 refills | Status: DC

## 2024-04-20 NOTE — Telephone Encounter (Signed)
 Spoke with patient, she wanted to try lipitor again, sent in lipitor 10 mg to patient's pharmacy.

## 2024-04-23 LAB — COLOGUARD: COLOGUARD: NEGATIVE

## 2024-04-29 ENCOUNTER — Ambulatory Visit

## 2024-04-29 VITALS — Ht 58.5 in | Wt 162.0 lb

## 2024-04-29 DIAGNOSIS — Z0000 Encounter for general adult medical examination without abnormal findings: Secondary | ICD-10-CM

## 2024-04-29 NOTE — Progress Notes (Signed)
 Subjective:   Brittany Short is a 68 y.o. who presents for a Medicare Wellness preventive visit.  As a reminder, Annual Wellness Visits don't include a physical exam, and some assessments may be limited, especially if this visit is performed virtually. We may recommend an in-person follow-up visit with your provider if needed.  Visit Complete: Virtual I connected with  Brittany Short on 04/29/24 by a audio enabled telemedicine application and verified that I am speaking with the correct person using two identifiers.  Patient Location: Home  Provider Location: Home Office  I discussed the limitations of evaluation and management by telemedicine. The patient expressed understanding and agreed to proceed.  Vital Signs: Because this visit was a virtual/telehealth visit, some criteria may be missing or patient reported. Any vitals not documented were not able to be obtained and vitals that have been documented are patient reported.  VideoDeclined- This patient declined Librarian, academic. Therefore the visit was completed with audio only.  Persons Participating in Visit: Patient.  AWV Questionnaire: No: Patient Medicare AWV questionnaire was not completed prior to this visit.  Cardiac Risk Factors include: advanced age (>38men, >21 women);dyslipidemia;hypertension     Objective:    Today's Vitals   04/29/24 1033  Weight: 162 lb (73.5 kg)  Height: 4' 10.5 (1.486 m)   Body mass index is 33.28 kg/m.     04/29/2024   10:41 AM 04/24/2023    9:05 AM 03/08/2022    8:11 AM  Advanced Directives  Does Patient Have a Medical Advance Directive? No No No  Would patient like information on creating a medical advance directive? Yes (MAU/Ambulatory/Procedural Areas - Information given)  Yes (MAU/Ambulatory/Procedural Areas - Information given)    Current Medications (verified) Outpatient Encounter Medications as of 04/29/2024  Medication Sig   acebutolol  (SECTRAL )  200 MG capsule Take 1 capsule (200 mg total) by mouth 2 (two) times daily.   albuterol  (PROVENTIL ) (2.5 MG/3ML) 0.083% nebulizer solution INHALE ONE VIAL VIA NEBULIZER EVERY 6 HOURS AS NEEDED FOR WHEEZING AND SHORTNESS OF BREATH   albuterol  (VENTOLIN  HFA) 108 (90 Base) MCG/ACT inhaler Inhale 2 puffs into the lungs every 4 (four) hours as needed for wheezing or shortness of breath.   atorvastatin  (LIPITOR) 10 MG tablet Take 1 tablet (10 mg total) by mouth daily.   budesonide-formoterol (SYMBICORT) 80-4.5 MCG/ACT inhaler Inhale 2 puffs into the lungs 2 (two) times daily.   diltiazem  (CARDIZEM  CD) 120 MG 24 hr capsule Take 1 capsule (120 mg total) by mouth daily.   famotidine  (PEPCID ) 40 MG tablet Take 1 tablet (40 mg total) by mouth at bedtime.   pantoprazole  (PROTONIX ) 40 MG tablet Take 1 tablet (40 mg total) by mouth 2 (two) times daily.   venlafaxine  XR (EFFEXOR -XR) 37.5 MG 24 hr capsule TAKE 1 CAPSULE BY MOUTH EVERY DAY   Vitamin D , Ergocalciferol , 50000 units CAPS TAKE 1 CAPSULE BY MOUTH EVERY SEVEN DAYS.   No facility-administered encounter medications on file as of 04/29/2024.    Allergies (verified) Patient has no known allergies.   History: Past Medical History:  Diagnosis Date   Acid reflux    Acute laryngopharyngitis 10/27/2023   Angular cheilitis 10/01/2022   Aortic valve stenosis 01/08/2022   Atypical chest pain 10/27/2023   Bronchitis 09/17/2023   Chest pain of uncertain etiology 10/02/2020   Chronic cough 10/27/2023   Chronic frontal sinusitis 01/04/2020   Colon cancer screening 08/02/2020   Encounter for screening mammogram for breast cancer 02/15/2021  GERD (gastroesophageal reflux disease)    Major depressive disorder, single episode, moderate (HCC)    Mild intermittent asthma with (acute) exacerbation    Mild intermittent asthma with acute exacerbation 03/08/2023   Mild intermittent asthma without complication 10/01/2022   Mixed hyperlipidemia 10/01/2022   Morbid  obesity (HCC) 10/02/2020   Need for prophylactic vaccination against Streptococcus pneumoniae (pneumococcus) 08/02/2020   Needs flu shot 08/02/2020   Nontoxic single thyroid  nodule    NSVT (nonsustained ventricular tachycardia) (HCC) 10/02/2020   Palpitations 08/02/2020   PAT (paroxysmal atrial tachycardia) (HCC) 10/02/2020   Primary hypertension 01/08/2022   Shortness of breath 10/27/2023   Tremor 02/15/2021   Vitamin D  deficiency 10/01/2022   Past Surgical History:  Procedure Laterality Date   CESAREAN SECTION  1982 & 1998   X2   MENISCUS REPAIR Right 2019   THYROIDECTOMY Right    Family History  Problem Relation Age of Onset   Lung cancer Mother    COPD Mother    Heart attack Father    Depression Sister    Suicidality Sister    Lung cancer Brother    COPD Brother    Lung cancer Brother    Breast cancer Neg Hx    Social History   Socioeconomic History   Marital status: Married    Spouse name: Brittany Short   Number of children: 2   Years of education: Not on file   Highest education level: Not on file  Occupational History   Not on file  Tobacco Use   Smoking status: Never    Passive exposure: Past   Smokeless tobacco: Never  Vaping Use   Vaping status: Never Used  Substance and Sexual Activity   Alcohol use: Not Currently   Drug use: Never   Sexual activity: Yes    Partners: Male    Birth control/protection: None  Other Topics Concern   Not on file  Social History Narrative   Not on file   Social Drivers of Health   Financial Resource Strain: Low Risk  (04/29/2024)   Overall Financial Resource Strain (CARDIA)    Difficulty of Paying Living Expenses: Not hard at all  Food Insecurity: No Food Insecurity (04/29/2024)   Hunger Vital Sign    Worried About Running Out of Food in the Last Year: Never true    Ran Out of Food in the Last Year: Never true  Transportation Needs: No Transportation Needs (04/29/2024)   PRAPARE - Administrator, Civil Service  (Medical): No    Lack of Transportation (Non-Medical): No  Physical Activity: Insufficiently Active (04/29/2024)   Exercise Vital Sign    Days of Exercise per Week: 3 days    Minutes of Exercise per Session: 30 min  Stress: No Stress Concern Present (04/29/2024)   [employer] of Occupational Health - Occupational Stress Questionnaire    Feeling of Stress: Not at all  Social Connections: Socially Integrated (04/29/2024)   Social Connection and Isolation Panel    Frequency of Communication with Friends and Family: More than three times a week    Frequency of Social Gatherings with Friends and Family: Twice a week    Attends Religious Services: More than 4 times per year    Active Member of [employer] or Organizations: Yes    Attends Engineer, structural: More than 4 times per year    Marital Status: Married    Tobacco Counseling Counseling given: Not Answered    Clinical Intake:  Consulting civil engineer  completed: Yes  Pain : No/denies pain     Diabetes: No  Lab Results  Component Value Date   HGBA1C 5.7 (H) 04/07/2024     How often do you need to have someone help you when you read instructions, pamphlets, or other written materials from your doctor or pharmacy?: 1 - Never  Interpreter Needed?: No  Information entered by :: Charmaine Bloodgood LPN   Activities of Daily Living     04/29/2024   10:40 AM  In your present state of health, do you have any difficulty performing the following activities:  Hearing? 0  Vision? 0  Difficulty concentrating or making decisions? 0  Walking or climbing stairs? 0  Dressing or bathing? 0  Doing errands, shopping? 0  Preparing Food and eating ? N  Using the Toilet? N  In the past six months, have you accidently leaked urine? N  Do you have problems with loss of bowel control? N  Managing your Medications? N  Managing your Finances? N  Housekeeping or managing your Housekeeping? N    Patient Care Team: Nicholaus Credit, PA-C as  PCP - General (Physician Assistant) Tobb, Kardie, DO as PCP - Cardiology [facility], Redell PARAS, MD as Consulting Physician (Cardiology) Maurilio, Camellia PARAS, MD as Consulting Physician (Allergy and Immunology)  I have updated your Care Teams any recent Medical Services you may have received from other providers in the past year.     Assessment:   This is a routine wellness examination for Brittany Short.  Hearing/Vision screen Hearing Screening - Comments:: Denies hearing difficulties   Vision Screening - Comments:: No vision problems; will schedule routine eye exam soon     Goals Addressed             This Visit's Progress    Maintain health and independence   On track      Depression Screen     04/29/2024   10:38 AM 04/07/2024    8:14 AM 10/07/2023    9:00 AM 04/24/2023    9:03 AM 04/04/2023    9:11 AM 03/04/2023    8:56 AM 10/01/2022    8:45 AM  PHQ 2/9 Scores  PHQ - 2 Score 0 0 0 0 0 0 0  PHQ- 9 Score 0 0 0 0 0 3 1    Fall Risk     04/29/2024   10:40 AM 04/07/2024    8:14 AM 04/07/2024    8:13 AM 10/07/2023    9:00 AM 04/24/2023    8:57 AM  Fall Risk   Falls in the past year? 1 1 0 0 0  Number falls in past yr: 0 0 0 0 0  Injury with Fall? 1 1 0 0 0  Risk for fall due to : History of fall(s);Impaired balance/gait;Impaired mobility History of fall(s) No Fall Risks No Fall Risks No Fall Risks  Follow up Education provided;Falls prevention discussed;Falls evaluation completed   Falls evaluation completed Falls evaluation completed;Education provided    MEDICARE RISK AT HOME:  Medicare Risk at Home Any stairs in or around the home?: No If so, are there any without handrails?: No Home free of loose throw rugs in walkways, pet beds, electrical cords, etc?: Yes Adequate lighting in your home to reduce risk of falls?: Yes Life alert?: No Use of a cane, walker or w/c?: No Grab bars in the bathroom?: Yes Shower chair or bench in shower?: No Elevated  toilet seat or a handicapped toilet?: Yes  TIMED UP AND GO:  Was the test performed?  No  Cognitive Function: Declined/Normal: No cognitive concerns noted by patient or family. Patient alert, oriented, able to answer questions appropriately and recall recent events. No signs of memory loss or confusion.        04/24/2023    9:06 AM 03/08/2022    8:13 AM 09/18/2021   10:01 AM  6CIT Screen  What Year? 0 points 0 points 0 points  What month? 0 points 0 points 0 points  What time? 0 points 0 points 0 points  Count back from 20 0 points 0 points 0 points  Months in reverse 0 points 0 points 0 points  Repeat phrase 0 points 0 points 0 points  Total Score 0 points 0 points 0 points    Immunizations Immunization History  Administered Date(s) Administered   Fluad Quad(high Dose 65+) 08/02/2020, 08/17/2021, 10/01/2022   Fluad Trivalent(High Dose 65+) 08/11/2023   Influenza-Unspecified 07/28/2018, 08/31/2019   PNEUMOCOCCAL CONJUGATE-20 08/17/2021   Pneumococcal Conjugate-13 08/02/2020   Zoster Recombinant(Shingrix) 06/04/2023, 10/31/2023   Zoster, Unspecified 12/10/2023    Screening Tests Health Maintenance  Topic Date Due   DTaP/Tdap/Td (1 - Tdap) 10/06/2024 (Originally 10/29/1974)   INFLUENZA VACCINE  05/28/2024   MAMMOGRAM  05/28/2024   Medicare Annual Wellness (AWV)  04/29/2025   Fecal DNA (Cologuard)  04/19/2027   Pneumococcal Vaccine: 50+ Years  Completed   DEXA SCAN  Completed   Zoster Vaccines- Shingrix  Completed   Hepatitis B Vaccines  Aged Out   HPV VACCINES  Aged Out   Meningococcal B Vaccine  Aged Out   COVID-19 Vaccine  Discontinued   Hepatitis C Screening  Discontinued    Health Maintenance  There are no preventive care reminders to display for this patient.   Additional Screening:  Vision Screening: Recommended annual ophthalmology exams for early detection of glaucoma and other disorders of the eye. Would you like a referral to an eye doctor? No     Dental Screening: Recommended annual dental exams for proper oral hygiene  Community Resource Referral / Chronic Care Management: CRR required this visit?  No   CCM required this visit?  No   Plan:    I have personally reviewed and noted the following in the patient's chart:   Medical and social history Use of alcohol, tobacco or illicit drugs  Current medications and supplements including opioid prescriptions. Patient is not currently taking opioid prescriptions. Functional ability and status Nutritional status Physical activity Advanced directives List of other physicians Hospitalizations, surgeries, and ER visits in previous 12 months Vitals Screenings to include cognitive, depression, and falls Referrals and appointments  In addition, I have reviewed and discussed with patient certain preventive protocols, quality metrics, and best practice recommendations. A written personalized care plan for preventive services as well as general preventive health recommendations were provided to patient.   Lavelle Pfeiffer [place], [place]   12/03/7972   After Visit Summary: (MyChart) Due to this being a telephonic visit, the after visit summary with patients personalized plan was offered to patient via MyChart   Notes: Nothing significant to report at this time.

## 2024-04-29 NOTE — Patient Instructions (Signed)
 Brittany Short , Thank you for taking time out of your busy schedule to complete your Annual Wellness Visit with me. I enjoyed our conversation and look forward to speaking with you again next year. I, as well as your care team,  appreciate your ongoing commitment to your health goals. Please review the following plan we discussed and let me know if I can assist you in the future. Your Game plan/ To Do List    Referrals:You have an order for:  []   2D Mammogram  [x]   3D Mammogram  []   Bone Density     Please call for appointment:   [facility] [address] [place], [place] [address] [phone] -DEXA [phone] -MAMMO   Make sure to wear two-piece clothing.  No lotions, powders, or deodorants the day of the appointment. Make sure to bring picture ID and insurance card.  Bring list of medications you are currently taking including any supplements.   Follow up Visits: Next Medicare AWV with our clinical staff: In 1 year    Have you seen your provider in the last 6 months (3 months if uncontrolled diabetes)? Yes Next Office Visit with your provider: 10/07/24 @ 8:20   labs 05/17/24 @ 7:45  Clinician Recommendations:  Aim for 30 minutes of exercise or brisk walking, 6-8 glasses of water, and 5 servings of fruits and vegetables each day.       This is a list of the screening recommended for you and due dates:  Health Maintenance  Topic Date Due   DTaP/Tdap/Td vaccine (1 - Tdap) 10/06/2024*   Flu Shot  05/28/2024   Mammogram  05/28/2024   Medicare Annual Wellness Visit  04/29/2025   Cologuard (Stool DNA test)  04/19/2027   Pneumococcal Vaccine for age over 40  Completed   DEXA scan (bone density measurement)  Completed   Zoster (Shingles) Vaccine  Completed   Hepatitis B Vaccine  Aged Out   HPV Vaccine  Aged Out   Meningitis B Vaccine  Aged Out   COVID-19 Vaccine  Discontinued   Hepatitis C Screening  Discontinued  *Topic was postponed. The  date shown is not the original due date.    Advanced directives: (ACP Link)Information on Advanced Care Planning can be found at [place]  Secretary of [facility] Advance Health Care Directives Advance Health Care Directives. [email or web address]  Advance Care Planning is important because it:  [x]  Makes sure you receive the medical care that is consistent with your values, goals, and preferences  [x]  It provides guidance to your family and loved ones and reduces their decisional burden about whether or not they are making the right decisions based on your wishes.  Follow the link provided in your after visit summary or read over the paperwork we have mailed to you to help you started getting your Advance Directives in place. If you need assistance in completing these, please reach out to us  so that we can help you!  See attachments for Preventive Care and Fall Prevention Tips.

## 2024-05-17 ENCOUNTER — Other Ambulatory Visit: Payer: Self-pay | Admitting: Physician Assistant

## 2024-05-17 ENCOUNTER — Other Ambulatory Visit

## 2024-05-17 ENCOUNTER — Other Ambulatory Visit: Payer: Self-pay | Admitting: Cardiology

## 2024-05-17 DIAGNOSIS — F321 Major depressive disorder, single episode, moderate: Secondary | ICD-10-CM

## 2024-05-17 DIAGNOSIS — R899 Unspecified abnormal finding in specimens from other organs, systems and tissues: Secondary | ICD-10-CM

## 2024-05-17 DIAGNOSIS — E559 Vitamin D deficiency, unspecified: Secondary | ICD-10-CM

## 2024-05-18 ENCOUNTER — Ambulatory Visit: Payer: Self-pay | Admitting: Physician Assistant

## 2024-05-18 LAB — TSH: TSH: 3.17 u[IU]/mL (ref 0.450–4.500)

## 2024-06-04 DIAGNOSIS — M79671 Pain in right foot: Secondary | ICD-10-CM | POA: Diagnosis not present

## 2024-06-04 DIAGNOSIS — M79604 Pain in right leg: Secondary | ICD-10-CM | POA: Diagnosis not present

## 2024-06-22 DIAGNOSIS — Z1231 Encounter for screening mammogram for malignant neoplasm of breast: Secondary | ICD-10-CM | POA: Diagnosis not present

## 2024-06-22 LAB — HM MAMMOGRAPHY

## 2024-06-23 ENCOUNTER — Encounter: Payer: Self-pay | Admitting: Physician Assistant

## 2024-07-08 ENCOUNTER — Encounter: Payer: Self-pay | Admitting: Allergy and Immunology

## 2024-07-08 ENCOUNTER — Ambulatory Visit: Admitting: Allergy and Immunology

## 2024-07-08 VITALS — BP 142/66 | HR 60 | Resp 18

## 2024-07-08 DIAGNOSIS — K219 Gastro-esophageal reflux disease without esophagitis: Secondary | ICD-10-CM

## 2024-07-08 DIAGNOSIS — J4540 Moderate persistent asthma, uncomplicated: Secondary | ICD-10-CM

## 2024-07-08 DIAGNOSIS — J3089 Other allergic rhinitis: Secondary | ICD-10-CM

## 2024-07-08 MED ORDER — PANTOPRAZOLE SODIUM 40 MG PO TBEC: DELAYED_RELEASE_TABLET | ORAL | 1 refills | Status: DC

## 2024-07-08 NOTE — Progress Notes (Signed)
 [place] [facility] [place] - [place]   Follow-up Note  Referring Provider: Nicholaus Credit, PA-C Primary Provider: Nicholaus Credit, DEVONNA Date of Office Visit: 07/08/2024  Subjective:   Brittany Short (DOB: 01/29/1956) is a 68 y.o. female who [facility] in re-evaluation of the following:  HPI: Brittany Short returns to this clinic in evaluation of asthma, rhinitis, LPR.  I last saw her in this clinic 15 April 2024.  Once again she has not had any coughing.  During her last visit we had her use Symbicort as needed and her requirement for Symbicort has been 0.  During her last visit we had her eliminate famotidine  and just continue on Protonix  twice a day.  She has no throat clearing no irritation of her throat no raspy voice no coughing no shortness of breath and in fact has no respiratory tract symptoms at all.  She has not required a systemic steroid or antibiotic for any type of airway issue.  She has eliminated all caffeine consumption  Allergies as of 07/08/2024   No Known Allergies      Medication List    acebutolol  200 MG capsule Commonly known as: SECTRAL  Take 1 capsule (200 mg total) by mouth 2 (two) times daily.   albuterol  108 (90 Base) MCG/ACT inhaler Commonly known as: VENTOLIN  HFA Inhale 2 puffs into the lungs every 4 (four) hours as needed for wheezing or shortness of breath.   albuterol  (2.5 MG/3ML) 0.083% nebulizer solution Commonly known as: PROVENTIL  INHALE ONE VIAL VIA NEBULIZER EVERY 6 HOURS AS NEEDED FOR WHEEZING AND SHORTNESS OF BREATH   atorvastatin  10 MG tablet Commonly known as: LIPITOR Take 1 tablet (10 mg total) by mouth daily.   diltiazem  120 MG 24 hr capsule Commonly known as: CARDIZEM  CD TAKE 1 CAPSULE BY MOUTH EVERY DAY   famotidine  40 MG tablet Commonly known as: PEPCID  Take 1 tablet (40 mg total) by mouth at bedtime. What changed: additional instructions   pantoprazole  40 MG  tablet Commonly known as: PROTONIX  Take 1 tablet (40 mg total) by mouth 2 (two) times daily.   Symbicort 80-4.5 MCG/ACT inhaler Generic drug: budesonide-formoterol Inhale 2 puffs into the lungs 2 (two) times daily.   venlafaxine  XR 37.5 MG 24 hr capsule Commonly known as: EFFEXOR -XR TAKE 1 CAPSULE BY MOUTH EVERY DAY   Vitamin D  (Ergocalciferol ) 1.25 MG (50000 UNIT) Caps capsule Commonly known as: DRISDOL  TAKE 1 CAPSULE BY MOUTH EVERY SEVEN DAYS    Past Medical History:  Diagnosis Date   Acid reflux    Acute laryngopharyngitis 10/27/2023   Angular cheilitis 10/01/2022   Aortic valve stenosis 01/08/2022   Atypical chest pain 10/27/2023   Bronchitis 09/17/2023   Chest pain of uncertain etiology 10/02/2020   Chronic cough 10/27/2023   Chronic frontal sinusitis 01/04/2020   Colon cancer screening 08/02/2020   Encounter for screening mammogram for breast cancer 02/15/2021   GERD (gastroesophageal reflux disease)    Major depressive disorder, single episode, moderate (HCC)    Mild intermittent asthma with (acute) exacerbation    Mild intermittent asthma with acute exacerbation 03/08/2023   Mild intermittent asthma without complication 10/01/2022   Mixed hyperlipidemia 10/01/2022   Morbid obesity (HCC) 10/02/2020   Need for prophylactic vaccination against Streptococcus pneumoniae (pneumococcus) 08/02/2020   Needs flu shot 08/02/2020   Nontoxic single thyroid  nodule    NSVT (nonsustained ventricular tachycardia) (HCC) 10/02/2020   Palpitations 08/02/2020   PAT (paroxysmal atrial  tachycardia) (HCC) 10/02/2020   Primary hypertension 01/08/2022   Shortness of breath 10/27/2023   Tremor 02/15/2021   Vitamin D  deficiency 10/01/2022    Past Surgical History:  Procedure Laterality Date   CESAREAN SECTION  1982 & 1998   X2   MENISCUS REPAIR Right 2019   THYROIDECTOMY Right     Review of systems negative except as noted in HPI / PMHx or noted below:  Review of Systems   Constitutional: Negative.   HENT: Negative.    Eyes: Negative.   Respiratory: Negative.    Cardiovascular: Negative.   Gastrointestinal: Negative.   Genitourinary: Negative.   Musculoskeletal: Negative.   Skin: Negative.   Neurological: Negative.   Endo/Heme/Allergies: Negative.   Psychiatric/Behavioral: Negative.       Objective:   Vitals:   07/08/24 0832  BP: (!) 156/78  Pulse: 60  Resp: 18  SpO2: 94%          Physical Exam Constitutional:      Appearance: She is not diaphoretic.  HENT:     Head: Normocephalic.     Right Ear: Tympanic membrane, ear canal and external ear normal.     Left Ear: Tympanic membrane, ear canal and external ear normal.     Nose: Nose normal. No mucosal edema or rhinorrhea.     Mouth/Throat:     Pharynx: Uvula midline. No oropharyngeal exudate.  Eyes:     Conjunctiva/sclera: Conjunctivae normal.  Neck:     Thyroid : No thyromegaly.     Trachea: Trachea normal. No tracheal tenderness or tracheal deviation.  Cardiovascular:     Rate and Rhythm: Normal rate and regular rhythm.     Heart sounds: Normal heart sounds, S1 normal and S2 normal. No murmur heard. Pulmonary:     Effort: No respiratory distress.     Breath sounds: Normal breath sounds. No stridor. No wheezing or rales.  Lymphadenopathy:     Head:     Right side of head: No tonsillar adenopathy.     Left side of head: No tonsillar adenopathy.     Cervical: No cervical adenopathy.  Skin:    Findings: No erythema or rash.     Nails: There is no clubbing.  Neurological:     Mental Status: She is alert.     Diagnostics: Spirometry was performed and demonstrated an FEV1 of 1.58 at 86 % of predicted.  Assessment and Plan:   1. Asthma, moderate persistent, well-controlled   2. Other allergic rhinitis   3. LPRD (laryngopharyngeal reflux disease)    1. Continue to Treat and prevent reflux induced inflammation of airway:   A. Protonix  to 40 mg - 1-2 times per day  B.  Always aim to consolidate caffeine / chocolate  D. Replace throat clearing with drinking/swallowing maneuver  2. If needed:   A. Mucinex  DM    B. OTC loratadine    C. Symbicort - 2 inhalations every 6 hours   D. Famotidine  40 mg - 1 tablet in evening  3. Return to clinic in 6 months or earlier if problem.   4. Influenza = Tamiflu. Covid = Paxlovid  5. Plan for fall flu vaccine  Brittany Short is doing very well and she will remain on Protonix  and she has the opportunity to attempt to consolidate her Protonix  from twice a day to once a day.  She can always add in famotidine  in the evening should it be required for her LPR.  She can always use Symbicort as her anti-inflammatory  rescue medicine should it be required.  Assuming she does well with this plan I will see her back in this clinic in 6 months or earlier if there is a problem.  Brittany Denis, MD Allergy / Immunology [facility]

## 2024-07-08 NOTE — Patient Instructions (Addendum)
  1. Continue to Treat and prevent reflux induced inflammation of airway:   A. Protonix  to 40 mg - 1-2 times per day  B. Always aim to consolidate caffeine / chocolate  D. Replace throat clearing with drinking/swallowing maneuver  2. If needed:   A. Mucinex  DM    B. OTC loratadine    C. Symbicort - 2 inhalations every 6 hours   D. Famotidine  40 mg - 1 tablet in evening  3. Return to clinic in 6 months or earlier if problem.   4. Influenza = Tamiflu. Covid = Paxlovid  5. Plan for fall flu vaccine

## 2024-07-12 ENCOUNTER — Encounter: Payer: Self-pay | Admitting: Allergy and Immunology

## 2024-07-12 NOTE — Progress Notes (Signed)
   07/12/2024  [name], female   DOB: 05-31-1956, 68 y.o.   MRN: [id number]  Pharmacy Quality Measure Review  This patient is appearing on a report for being at risk of failing the adherence measure for cholesterol (statin) medications this calendar year.   Medication: atorvastatin   Last fill date: 04/20/2024 for 90 day supply  Insurance report was not up to date. No action needed at this time.   Lang Sieve, PharmD, BCGP Clinical Pharmacist  [phone]

## 2024-07-15 ENCOUNTER — Ambulatory Visit (INDEPENDENT_AMBULATORY_CARE_PROVIDER_SITE_OTHER): Admitting: Physician Assistant

## 2024-07-15 ENCOUNTER — Encounter: Payer: Self-pay | Admitting: Physician Assistant

## 2024-07-15 VITALS — BP 120/60 | HR 67 | Temp 98.2°F | Resp 18 | Ht 58.5 in | Wt 161.2 lb

## 2024-07-15 DIAGNOSIS — R3589 Other polyuria: Secondary | ICD-10-CM

## 2024-07-15 DIAGNOSIS — J069 Acute upper respiratory infection, unspecified: Secondary | ICD-10-CM

## 2024-07-15 DIAGNOSIS — K130 Diseases of lips: Secondary | ICD-10-CM | POA: Diagnosis not present

## 2024-07-15 LAB — POCT URINALYSIS DIP (CLINITEK)
Bilirubin, UA: NEGATIVE
Blood, UA: NEGATIVE
Glucose, UA: NEGATIVE mg/dL
Ketones, POC UA: NEGATIVE mg/dL
Leukocytes, UA: NEGATIVE
Nitrite, UA: NEGATIVE
POC PROTEIN,UA: NEGATIVE
Spec Grav, UA: 1.01 (ref 1.010–1.025)
Urobilinogen, UA: 0.2 U/dL
pH, UA: 6 (ref 5.0–8.0)

## 2024-07-15 LAB — POCT FLU A/B STATUS
Influenza A, POC: NEGATIVE
Influenza B, POC: NEGATIVE

## 2024-07-15 LAB — POCT RAPID STREP A (OFFICE): Rapid Strep A Screen: NEGATIVE

## 2024-07-15 LAB — POC COVID19 BINAXNOW: SARS Coronavirus 2 Ag: NEGATIVE

## 2024-07-15 MED ORDER — HYDROCODONE BIT-HOMATROP MBR 5-1.5 MG/5ML PO SOLN
5.0000 mL | Freq: Four times a day (QID) | ORAL | 0 refills | Status: DC | PRN
Start: 2024-07-15 — End: 2024-07-29

## 2024-07-15 MED ORDER — KETOCONAZOLE 2 % EX CREA
1.0000 | TOPICAL_CREAM | Freq: Every day | CUTANEOUS | 0 refills | Status: AC
Start: 2024-07-15 — End: ?

## 2024-07-15 MED ORDER — AMOXICILLIN 875 MG PO TABS: 875.0000 mg | ORAL_TABLET | Freq: Two times a day (BID) | ORAL | 0 refills | Status: AC

## 2024-07-15 MED ORDER — TRIAMCINOLONE ACETONIDE 40 MG/ML IJ SUSP
60.0000 mg | Freq: Once | INTRAMUSCULAR | Status: AC
  Administered 2024-07-15: 60 mg via INTRAMUSCULAR

## 2024-07-15 NOTE — Progress Notes (Signed)
 Acute Office Visit  Subjective:    [name], female    DOB: 29-Dec-1955, 68 y.o.   MRN: [id number]  Chief Complaint  Patient presents with   URI   Sore Throat    HPI: Patient is in today for complaints of low grade fever, cough, congestion and sore throat since Sunday.  She has had some productive cough and wheezing - however she is only using albuterol  as needed and is not taking symbicort as directed She denies abdominal pain, nausea, vomiting States she is still feeling slightly weak and cough productive  She does mention that she has had some urine frequency as well - denies dysuria or hematuria  Pt has rash on corners of mouth  Current Outpatient Medications:    acebutolol  (SECTRAL ) 200 MG capsule, Take 1 capsule (200 mg total) by mouth 2 (two) times daily., Disp: 180 capsule, Rfl: 3   albuterol  (PROVENTIL ) (2.5 MG/3ML) 0.083% nebulizer solution, INHALE ONE VIAL VIA NEBULIZER EVERY 6 HOURS AS NEEDED FOR WHEEZING AND SHORTNESS OF BREATH, Disp: 75 mL, Rfl: 3   albuterol  (VENTOLIN  HFA) 108 (90 Base) MCG/ACT inhaler, Inhale 2 puffs into the lungs every 4 (four) hours as needed for wheezing or shortness of breath., Disp: 18 g, Rfl: 2   amoxicillin  (AMOXIL ) 875 MG tablet, Take 1 tablet (875 mg total) by mouth 2 (two) times daily for 10 days., Disp: 20 tablet, Rfl: 0   atorvastatin  (LIPITOR) 10 MG tablet, Take 1 tablet (10 mg total) by mouth daily., Disp: 90 tablet, Rfl: 0   budesonide-formoterol (SYMBICORT) 80-4.5 MCG/ACT inhaler, Inhale 2 puffs into the lungs 2 (two) times daily., Disp: , Rfl:    diltiazem  (CARDIZEM  CD) 120 MG 24 hr capsule, TAKE 1 CAPSULE BY MOUTH EVERY DAY, Disp: 90 capsule, Rfl: 1   famotidine  (PEPCID ) 40 MG tablet, Take 1 tablet (40 mg total) by mouth at bedtime. (Patient taking differently: Take 40 mg by mouth at bedtime. Takes if needed.), Disp: 30 tablet, Rfl: 5   ketoconazole  (NIZORAL ) 2 % cream, Apply 1 Application topically daily., Disp: 15  g, Rfl: 0   pantoprazole  (PROTONIX ) 40 MG tablet, Take one tablet one to two times daily as directed., Disp: 180 tablet, Rfl: 1   venlafaxine  XR (EFFEXOR -XR) 37.5 MG 24 hr capsule, TAKE 1 CAPSULE BY MOUTH EVERY DAY, Disp: 90 capsule, Rfl: 1   Vitamin D , Ergocalciferol , (DRISDOL ) 1.25 MG (50000 UNIT) CAPS capsule, TAKE 1 CAPSULE BY MOUTH EVERY SEVEN DAYS, Disp: 12 capsule, Rfl: 1   HYDROcodone  bit-homatropine (HYDROMET) 5-1.5 MG/5ML syrup, Take 5 mLs by mouth every 6 (six) hours as needed., Disp: 120 mL, Rfl: 0  No Known Allergies  ROS CONSTITUTIONAL: see HPI E/N/T: see HPI CARDIOVASCULAR: Negative for chest pain, dizziness, palpitations and pedal edema.  RESPIRATORY: see HPI GASTROINTESTINAL: Negative for abdominal pain, acid reflux symptoms, constipation, diarrhea, nausea and vomiting.  GU - see HPI INTEGUMENTARY: see HPI      Objective:    PHYSICAL EXAM:   BP 120/60   Pulse 67   Temp 98.2 F (36.8 C) (Temporal)   Resp 18   Ht 4' 10.5 (1.486 m)   Wt 161 lb 3.2 oz (73.1 kg)   LMP  (LMP Unknown)   SpO2 95%   BMI 33.12 kg/m    GEN: Well nourished, well developed, in no acute distress  HEENT: normal external ears and nose - normal external auditory canals and TMS -  - Lips, Teeth and Gums - normal  Oropharynx - erythema/pnd Cardiac: RRR; no murmurs, rubs, Respiratory:  normal respiratory rate and pattern with no distress - rare wheeze- clears with cough Skin: scaling skin at corners of mouth  Office Visit on 07/15/2024  Component Date Value Ref Range Status   Influenza A, POC 07/15/2024 Negative  Negative Final   Influenza B, POC 07/15/2024 Negative  Negative Final   SARS Coronavirus 2 Ag 07/15/2024 Negative  Negative Final   Rapid Strep A Screen 07/15/2024 Negative  Negative Final   Color, UA 07/15/2024 yellow  yellow Final   Clarity, UA 07/15/2024 clear  clear Final   Glucose, UA 07/15/2024 negative  negative mg/dL Final   Bilirubin, UA 90/81/7974 negative   negative Final   Ketones, POC UA 07/15/2024 negative  negative mg/dL Final   Spec Grav, UA 90/81/7974 1.010  1.010 - 1.025 Final   Blood, UA 07/15/2024 negative  negative Final   pH, UA 07/15/2024 6.0  5.0 - 8.0 Final   POC PROTEIN,UA 07/15/2024 negative  negative, trace Final   Urobilinogen, UA 07/15/2024 0.2  0.2 or 1.0 E.U./dL Final   Nitrite, UA 90/81/7974 Negative  Negative Final   Leukocytes, UA 07/15/2024 Negative  Negative Final         Assessment & Plan:    Acute upper respiratory infection -     POCT Flu A & B Status -     POC COVID-19 BinaxNow -     POCT rapid strep A -     Triamcinolone  Acetonide 60 mg IM -     HYDROcodone  Bit-Homatrop MBr; Take 5 mLs by mouth every 6 (six) hours as needed.  Dispense: 120 mL; Refill: 0 -     Amoxicillin ; Take 1 tablet (875 mg total) by mouth 2 (two) times daily for 10 days.  Dispense: 20 tablet; Refill: 0  Polyuria -     POCT URINALYSIS DIP (CLINITEK) Ua normal - follow up if symptoms persist or worsen Angular cheilitis -     Ketoconazole ; Apply 1 Application topically daily.  Dispense: 15 g; Refill: 0     Follow-up: Return if symptoms worsen or fail to improve.  An After Visit Summary was printed and given to the patient.  CAMIE JONELLE NICHOLAUS DEVONNA [facility] [phone]

## 2024-07-27 ENCOUNTER — Other Ambulatory Visit: Payer: Self-pay | Admitting: Physician Assistant

## 2024-07-27 ENCOUNTER — Encounter: Payer: Self-pay | Admitting: Physician Assistant

## 2024-07-28 ENCOUNTER — Ambulatory Visit (INDEPENDENT_AMBULATORY_CARE_PROVIDER_SITE_OTHER): Admitting: Family Medicine

## 2024-07-28 ENCOUNTER — Ambulatory Visit: Payer: Self-pay

## 2024-07-28 ENCOUNTER — Encounter: Payer: Self-pay | Admitting: Family Medicine

## 2024-07-28 VITALS — BP 132/80 | HR 65 | Temp 97.8°F | Resp 16 | Ht 58.5 in | Wt 159.6 lb

## 2024-07-28 DIAGNOSIS — R300 Dysuria: Secondary | ICD-10-CM | POA: Diagnosis not present

## 2024-07-28 DIAGNOSIS — N3001 Acute cystitis with hematuria: Secondary | ICD-10-CM

## 2024-07-28 LAB — POCT URINALYSIS DIP (CLINITEK)
Bilirubin, UA: NEGATIVE
Glucose, UA: NEGATIVE mg/dL
Ketones, POC UA: NEGATIVE mg/dL
Nitrite, UA: NEGATIVE
POC PROTEIN,UA: NEGATIVE
Spec Grav, UA: 1.015 (ref 1.010–1.025)
Urobilinogen, UA: 0.2 U/dL
pH, UA: 6 (ref 5.0–8.0)

## 2024-07-28 MED ORDER — CIPROFLOXACIN HCL 500 MG PO TABS: 500.0000 mg | ORAL_TABLET | Freq: Two times a day (BID) | ORAL | 0 refills | Status: AC

## 2024-07-28 NOTE — Telephone Encounter (Signed)
 FYI Only or Action Required?: FYI only for provider.  Patient was last seen in primary care on 07/15/2024 by Nicholaus Credit, PA-C.  Called Nurse Triage reporting Urinary Frequency.  Symptoms began several days ago.  Interventions attempted: Nothing.  Symptoms are: unchanged.  Triage Disposition: See HCP Within 4 Hours (Or PCP Triage)  Patient/caregiver understands and will follow disposition?: Yes    Copied from CRM #[id number]. Topic: Clinical - Red Word Triage >> Jul 28, 2024  7:36 AM Pinkey ORN wrote: Red Word that prompted transfer to Nurse Triage: Intense Pressure >> Jul 28, 2024  7:37 AM Pinkey ORN wrote: Patient states she believes she has a UTI. States she's urinating frequently and when she goes it feels like the bottom is falling out.  Reason for Disposition  [1] SEVERE pain with urination (e.g., excruciating) AND [2] not improved after 2 hours of pain medicine  Answer Assessment - Initial Assessment Questions 1. SEVERITY: How bad is the pain?  (e.g., Scale 1-10; mild, moderate, or severe)     Moderate to severe 2. FREQUENCY: How many times have you had painful urination today?      Every time 3. PATTERN: Is pain present every time you urinate or just sometimes?      Yes, every time 4. ONSET: When did the painful urination start?      Monday 5. FEVER: Do you have a fever? If Yes, ask: What is your temperature, how was it measured, and when did it start?     no 6. PAST UTI: Have you had a urine infection before? If Yes, ask: When was the last time? and What happened that time?      yes 7. CAUSE: What do you think is causing the painful urination?  (e.g., UTI, scratch, Herpes sore)     UTI 8. OTHER SYMPTOMS: Do you have any other symptoms? (e.g., blood in urine, flank pain, genital sores, urgency, vaginal discharge)     Urgency  9. PREGNANCY: Is there any chance you are pregnant? When was your last menstrual period?     na  Protocols used:  Urination Pain - Female-A-AH

## 2024-07-28 NOTE — Progress Notes (Signed)
 Acute Office Visit  Subjective:    [name], female    DOB: [birth year], 68 y.o.   MRN: [id number]  Chief Complaint  Patient presents with   Dysuria   Discussed the use of AI scribe software for clinical note transcription with the patient, who gave verbal consent to proceed.  History of Present Illness   Artrice Kraker is a 68 year old female who presents with symptoms of a urinary tract infection.  Lower urinary tract symptoms - Dysuria, urgency, and frequency, worsening since last visit - Increased nocturia and urgency, with more frequent nighttime urination and strong urge to void - Sensation of burning and feeling like her bottom is falling out after urination - Trace amount of blood in urine - Bloating and suprapubic pressure - No flank pain or fever  Gastrointestinal symptoms - Nausea, attributed to worsening urinary symptoms  Infectious history and medication exposure - Recent treatment with doxycycline  and Kenalog  injection for upper respiratory infection, believed to have contributed to current symptoms - History of recurrent urinary tract infections  Antibiotic tolerance - No known antibiotic allergies - Previous tolerance of ciprofloxacin  without adverse effects   Past Medical History:  Diagnosis Date   Acid reflux    Acute laryngopharyngitis 10/27/2023   Angular cheilitis 10/01/2022   Aortic valve stenosis 01/08/2022   Atypical chest pain 10/27/2023   Bronchitis 09/17/2023   Chest pain of uncertain etiology 10/02/2020   Chronic cough 10/27/2023   Chronic frontal sinusitis 01/04/2020   Colon cancer screening 08/02/2020   Encounter for screening mammogram for breast cancer 02/15/2021   GERD (gastroesophageal reflux disease)    Major depressive disorder, single episode, moderate (HCC)    Mild intermittent asthma with (acute) exacerbation    Mild intermittent asthma with acute exacerbation 03/08/2023   Mild intermittent asthma without  complication 10/01/2022   Mixed hyperlipidemia 10/01/2022   Morbid obesity (HCC) 10/02/2020   Need for prophylactic vaccination against Streptococcus pneumoniae (pneumococcus) 08/02/2020   Needs flu shot 08/02/2020   Nontoxic single thyroid  nodule    NSVT (nonsustained ventricular tachycardia) (HCC) 10/02/2020   Palpitations 08/02/2020   PAT (paroxysmal atrial tachycardia) 10/02/2020   Primary hypertension 01/08/2022   Shortness of breath 10/27/2023   Tremor 02/15/2021   Vitamin D  deficiency 10/01/2022    Past Surgical History:  Procedure Laterality Date   CESAREAN SECTION  1982 & 1998   X2   MENISCUS REPAIR Right 2019   THYROIDECTOMY Right     Family History  Problem Relation Age of Onset   Lung cancer Mother    COPD Mother    Heart attack Father    Depression Sister    Suicidality Sister    Lung cancer Brother    COPD Brother    Lung cancer Brother    Breast cancer Neg Hx     Social History   Socioeconomic History   Marital status: Married    Spouse name: Dick   Number of children: 2   Years of education: Not on file   Highest education level: 12th grade  Occupational History   Not on file  Tobacco Use   Smoking status: Never    Passive exposure: Past   Smokeless tobacco: Never  Vaping Use   Vaping status: Never Used  Substance and Sexual Activity   Alcohol use: Not Currently   Drug use: Never   Sexual activity: Yes    Partners: Male    Birth control/protection: None  Other Topics Concern  Not on file  Social History Narrative   Not on file   Social Drivers of Health   Financial Resource Strain: Low Risk  (07/14/2024)   Overall Financial Resource Strain (CARDIA)    Difficulty of Paying Living Expenses: Not very hard  Food Insecurity: No Food Insecurity (07/14/2024)   Hunger Vital Sign    Worried About Running Out of Food in the Last Year: Never true    Ran Out of Food in the Last Year: Never true  Transportation Needs: No Transportation Needs  (07/14/2024)   PRAPARE - Administrator, Civil Service (Medical): No    Lack of Transportation (Non-Medical): No  Physical Activity: Sufficiently Active (07/14/2024)   Exercise Vital Sign    Days of Exercise per Week: 3 days    Minutes of Exercise per Session: 60 min  Recent Concern: Physical Activity - Insufficiently Active (04/29/2024)   Exercise Vital Sign    Days of Exercise per Week: 3 days    Minutes of Exercise per Session: 30 min  Stress: Stress Concern Present (07/14/2024)   [employer] of Occupational Health - Occupational Stress Questionnaire    Feeling of Stress: Rather much  Social Connections: Socially Integrated (07/14/2024)   Social Connection and Isolation Panel    Frequency of Communication with Friends and Family: More than three times a week    Frequency of Social Gatherings with Friends and Family: More than three times a week    Attends Religious Services: More than 4 times per year    Active Member of [employer] or Organizations: Yes    Attends Banker Meetings: More than 4 times per year    Marital Status: Married  Catering manager Violence: Not At Risk (04/29/2024)   Humiliation, Afraid, Rape, and Kick questionnaire    Fear of Current or Ex-Partner: No    Emotionally Abused: No    Physically Abused: No    Sexually Abused: No    Outpatient Medications Prior to Visit  Medication Sig Dispense Refill   acebutolol  (SECTRAL ) 200 MG capsule Take 1 capsule (200 mg total) by mouth 2 (two) times daily. 180 capsule 3   albuterol  (PROVENTIL ) (2.5 MG/3ML) 0.083% nebulizer solution INHALE ONE VIAL VIA NEBULIZER EVERY 6 HOURS AS NEEDED FOR WHEEZING AND SHORTNESS OF BREATH 75 mL 3   albuterol  (VENTOLIN  HFA) 108 (90 Base) MCG/ACT inhaler Inhale 2 puffs into the lungs every 4 (four) hours as needed for wheezing or shortness of breath. 18 g 2   atorvastatin  (LIPITOR) 10 MG tablet Take 1 tablet (10 mg total) by mouth daily. 90 tablet 0    budesonide-formoterol (SYMBICORT) 80-4.5 MCG/ACT inhaler Inhale 2 puffs into the lungs 2 (two) times daily.     diltiazem  (CARDIZEM  CD) 120 MG 24 hr capsule TAKE 1 CAPSULE BY MOUTH EVERY DAY 90 capsule 1   famotidine  (PEPCID ) 40 MG tablet Take 1 tablet (40 mg total) by mouth at bedtime. (Patient taking differently: Take 40 mg by mouth at bedtime. Takes if needed.) 30 tablet 5   HYDROcodone  bit-homatropine (HYDROMET) 5-1.5 MG/5ML syrup Take 5 mLs by mouth every 6 (six) hours as needed. 120 mL 0   ketoconazole  (NIZORAL ) 2 % cream Apply 1 Application topically daily. 15 g 0   montelukast  (SINGULAIR ) 10 MG tablet TAKE ONE TABLET BY MOUTH EVERY DAY 90 tablet 1   pantoprazole  (PROTONIX ) 40 MG tablet Take one tablet one to two times daily as directed. 180 tablet 1   venlafaxine  XR (EFFEXOR -XR)  37.5 MG 24 hr capsule TAKE 1 CAPSULE BY MOUTH EVERY DAY 90 capsule 1   Vitamin D , Ergocalciferol , (DRISDOL ) 1.25 MG (50000 UNIT) CAPS capsule TAKE 1 CAPSULE BY MOUTH EVERY SEVEN DAYS 12 capsule 1   No facility-administered medications prior to visit.    No Known Allergies  Review of Systems  Constitutional:  Negative for chills, diaphoresis, fatigue and fever.  HENT:  Negative for congestion, ear pain and sinus pain.   Eyes: Negative.   Respiratory:  Negative for cough and shortness of breath.   Cardiovascular:  Negative for chest pain.  Gastrointestinal:  Positive for nausea. Negative for abdominal pain, constipation, diarrhea and vomiting.  Endocrine: Negative.   Genitourinary:  Positive for dysuria, flank pain (left side), frequency and urgency.       Suprapubic pressure  Musculoskeletal:  Negative for arthralgias.  Allergic/Immunologic: Negative.   Neurological:  Negative for dizziness, weakness, light-headedness and headaches.  Hematological: Negative.   Psychiatric/Behavioral:  Negative for dysphoric mood. The patient is not nervous/anxious.        Objective:        07/28/2024   10:17 AM  07/15/2024    9:04 AM 07/08/2024    8:46 AM  Vitals with BMI  Height 4' 10.5 4' 10.5   Weight 159 lbs 10 oz 161 lbs 3 oz   BMI 32.78 33.11   Systolic 132 120 857  Diastolic 80 60 66  Pulse 65 67     No data found.   Physical Exam Vitals reviewed.  Constitutional:      General: She is not in acute distress.    Appearance: Normal appearance. She is obese. She is not ill-appearing.  Eyes:     Conjunctiva/sclera: Conjunctivae normal.  Cardiovascular:     Rate and Rhythm: Normal rate and regular rhythm.     Heart sounds: Normal heart sounds. No murmur heard. Pulmonary:     Effort: Pulmonary effort is normal.     Breath sounds: Normal breath sounds. No wheezing.  Abdominal:     General: Bowel sounds are normal.     Palpations: Abdomen is soft.     Tenderness: There is abdominal tenderness (suprapubic).     Comments: left flank pain  Musculoskeletal:        General: Normal range of motion.     Cervical back: Normal range of motion.  Neurological:     Mental Status: She is alert. Mental status is at baseline.  Psychiatric:        Mood and Affect: Mood normal.        Behavior: Behavior normal.      Lab Results  Component Value Date   TSH 3.170 05/17/2024   Lab Results  Component Value Date   WBC 6.4 04/07/2024   HGB 14.1 04/07/2024   HCT 43.8 04/07/2024   MCV 93 04/07/2024   PLT 231 04/07/2024   Lab Results  Component Value Date   NA 141 04/07/2024   K 4.1 04/07/2024   CO2 20 04/07/2024   GLUCOSE 105 (H) 04/07/2024   BUN 13 04/07/2024   CREATININE 0.77 04/07/2024   BILITOT 0.4 04/07/2024   ALKPHOS 84 04/07/2024   AST 18 04/07/2024   ALT 17 04/07/2024   PROT 6.5 04/07/2024   ALBUMIN 4.3 04/07/2024   CALCIUM  9.6 04/07/2024   EGFR 83 04/07/2024   Lab Results  Component Value Date   CHOL 205 (H) 04/07/2024   Lab Results  Component Value Date   HDL 49  04/07/2024   Lab Results  Component Value Date   LDLCALC 132 (H) 04/07/2024   Lab Results   Component Value Date   TRIG 134 04/07/2024   Lab Results  Component Value Date   CHOLHDL 4.2 04/07/2024   Lab Results  Component Value Date   HGBA1C 5.7 (H) 04/07/2024        Results for orders placed or performed in visit on 07/28/24  POCT URINALYSIS DIP (CLINITEK)   Collection Time: 07/28/24 10:30 AM  Result Value Ref Range   Color, UA yellow yellow   Clarity, UA cloudy (A) clear   Glucose, UA negative negative mg/dL   Bilirubin, UA negative negative   Ketones, POC UA negative negative mg/dL   Spec Grav, UA 8.984 8.989 - 1.025   Blood, UA trace-lysed (A) negative   pH, UA 6.0 5.0 - 8.0   POC PROTEIN,UA negative negative, trace   Urobilinogen, UA 0.2 0.2 or 1.0 E.U./dL   Nitrite, UA Negative Negative   Leukocytes, UA Large (3+) (A) Negative     Assessment & Plan:   Assessment & Plan Dysuria UA completed - Order Urine culture - Continue to drink plenty of water and empty bladder at least every 4 hours. Orders:   POCT URINALYSIS DIP (CLINITEK)   Urine Culture  Acute cystitis with hematuria Urinary tract infection Recurrent UTI with dysuria, urgency, frequency, burning, suprapubic pressure, bloating, and trace hematuria. Doxycycline  prescribed recently and it cleared up some symptoms. Ciprofloxacin  chosen for high sensitivity to E. coli. Lab Results  Component Value Date   COLORU yellow 07/28/2024   CLARITYU cloudy (A) 07/28/2024   GLUCOSEUR negative 07/28/2024   BILIRUBINUR negative 07/28/2024   KETONESU neg 03/06/2020   SPECGRAV 1.015 07/28/2024   RBCUR trace-lysed (A) 07/28/2024   PHUR 6.0 07/28/2024   PROTEINUR Positive (A) 03/06/2020   UROBILINOGEN 0.2 07/28/2024   LEUKOCYTESUR Large (3+) (A) 07/28/2024   - Order urine culture. - Prescribe ciprofloxacin  for 7 days. - Advise increased fluid intake. - Instruct to report worsening symptoms such as fever or increased pain.  Orders:   ciprofloxacin  (CIPRO ) 500 MG tablet; Take 1 tablet (500 mg total) by  mouth 2 (two) times daily for 7 days.     Orders Placed This Encounter  Procedures   Urine Culture   POCT URINALYSIS DIP (CLINITEK)     Follow-up: Return if symptoms worsen or fail to improve.  An After Visit Summary was printed and given to the patient.  Harrie Cedar, FNP [facility] [phone]

## 2024-07-29 ENCOUNTER — Ambulatory Visit: Attending: Cardiology | Admitting: Cardiology

## 2024-07-29 VITALS — BP 124/48 | HR 60 | Ht 60.0 in | Wt 160.6 lb

## 2024-07-29 DIAGNOSIS — I471 Supraventricular tachycardia, unspecified: Secondary | ICD-10-CM

## 2024-07-29 DIAGNOSIS — I10 Essential (primary) hypertension: Secondary | ICD-10-CM

## 2024-07-29 DIAGNOSIS — E782 Mixed hyperlipidemia: Secondary | ICD-10-CM

## 2024-07-29 DIAGNOSIS — I700 Atherosclerosis of aorta: Secondary | ICD-10-CM | POA: Diagnosis not present

## 2024-07-29 DIAGNOSIS — I493 Ventricular premature depolarization: Secondary | ICD-10-CM

## 2024-07-29 DIAGNOSIS — R079 Chest pain, unspecified: Secondary | ICD-10-CM | POA: Diagnosis not present

## 2024-07-29 MED ORDER — ACEBUTOLOL HCL 200 MG PO CAPS: 200.0000 mg | ORAL_CAPSULE | Freq: Two times a day (BID) | ORAL | 3 refills | Status: AC

## 2024-07-29 MED ORDER — DILTIAZEM HCL ER COATED BEADS 120 MG PO CP24: 120.0000 mg | ORAL_CAPSULE | Freq: Every day | ORAL | 3 refills | Status: DC

## 2024-07-29 NOTE — Progress Notes (Signed)
 Cardiology Office Note:    Date:  07/29/2024   ID:  Brittany Short, DOB Apr 08, 1956, MRN [id number]  PCP:  Brittany Credit, PA-C  Cardiologist:  Brittany Leiter, MD    Referring MD: Brittany Credit, PA-C    ASSESSMENT:    1. Chest pain of uncertain etiology   2. PVC (premature ventricular contraction)   3. Paroxysmal SVT (supraventricular tachycardia)   4. Primary hypertension   5. Mixed hyperlipidemia   6. Atherosclerosis of aorta    PLAN:    In order of problems listed above:  Knowing the results of her CTA I think her chest pain was due to asthma and she has markedly improved and presently asymptomatic Having no breakthrough arrhythmia continue rate limiting calcium  channel blocker low-dose beta-blocker Well-controlled on her current medical therapy I agree with resuming a statin with aortic atherosclerosis  Next appointment: I will plan to see her back in the office as needed   Medication Adjustments/Labs and Tests Ordered: Current medicines are reviewed at length with the patient today.  Concerns regarding medicines are outlined above.  No orders of the defined types were placed in this encounter.  Meds ordered this encounter  Medications   acebutolol  (SECTRAL ) 200 MG capsule    Sig: Take 1 capsule (200 mg total) by mouth 2 (two) times daily.    Dispense:  180 capsule    Refill:  3   diltiazem  (CARDIZEM  CD) 120 MG 24 hr capsule    Sig: Take 1 capsule (120 mg total) by mouth daily.    Dispense:  90 capsule    Refill:  3     History of Present Illness:    Brittany Short is a 68 y.o. female with a hx of chronic chest pain SVT PVCs hypertension and hyperlipidemia last seen 11/19/2023.  She sees Brittany Short for her chronic moderate persistent asthma  Compliance with diet, lifestyle and medications: Yes  She has improved both her asthma as well as chest pain and is not having any cardiovascular symptoms at this time Although her coronary calcium  score was 0 she had aortic  atherosclerosis I think she would benefit from lipid-lowering treatment and she is back on her statin  Had a cardiac CTA performed 12/01/2023 with a coronary calcium  score of 0 normal coronary arteries and no coronary atherosclerosis.  She did have aortic atherosclerosis noted. Past Medical History:  Diagnosis Date   Acid reflux    Acute laryngopharyngitis 10/27/2023   Angular cheilitis 10/01/2022   Aortic valve stenosis 01/08/2022   Atypical chest pain 10/27/2023   Bronchitis 09/17/2023   Chest pain of uncertain etiology 10/02/2020   Chronic cough 10/27/2023   Chronic frontal sinusitis 01/04/2020   Colon cancer screening 08/02/2020   Encounter for screening mammogram for breast cancer 02/15/2021   GERD (gastroesophageal reflux disease)    Major depressive disorder, single episode, moderate (HCC)    Mild intermittent asthma with (acute) exacerbation    Mild intermittent asthma with acute exacerbation 03/08/2023   Mild intermittent asthma without complication 10/01/2022   Mixed hyperlipidemia 10/01/2022   Morbid obesity (HCC) 10/02/2020   Need for prophylactic vaccination against Streptococcus pneumoniae (pneumococcus) 08/02/2020   Needs flu shot 08/02/2020   Nontoxic single thyroid  nodule    NSVT (nonsustained ventricular tachycardia) (HCC) 10/02/2020   Palpitations 08/02/2020   PAT (paroxysmal atrial tachycardia) 10/02/2020   Primary hypertension 01/08/2022   Shortness of breath 10/27/2023   Tremor 02/15/2021   Vitamin D  deficiency 10/01/2022    Current Medications:  Current Meds  Medication Sig   albuterol  (PROVENTIL ) (2.5 MG/3ML) 0.083% nebulizer solution INHALE ONE VIAL VIA NEBULIZER EVERY 6 HOURS AS NEEDED FOR WHEEZING AND SHORTNESS OF BREATH   albuterol  (VENTOLIN  HFA) 108 (90 Base) MCG/ACT inhaler Inhale 2 puffs into the lungs every 4 (four) hours as needed for wheezing or shortness of breath.   atorvastatin  (LIPITOR) 10 MG tablet Take 1 tablet (10 mg total) by mouth  daily.   budesonide-formoterol (SYMBICORT) 80-4.5 MCG/ACT inhaler Inhale 2 puffs into the lungs 2 (two) times daily.   ciprofloxacin  (CIPRO ) 500 MG tablet Take 1 tablet (500 mg total) by mouth 2 (two) times daily for 7 days.   famotidine  (PEPCID ) 40 MG tablet Take 1 tablet (40 mg total) by mouth at bedtime. (Patient taking differently: Take 40 mg by mouth at bedtime. Takes if needed.)   ketoconazole  (NIZORAL ) 2 % cream Apply 1 Application topically daily. (Patient taking differently: Apply 1 Application topically 2 (two) times daily as needed.)   montelukast  (SINGULAIR ) 10 MG tablet TAKE ONE TABLET BY MOUTH EVERY DAY   pantoprazole  (PROTONIX ) 40 MG tablet Take one tablet one to two times daily as directed.   venlafaxine  XR (EFFEXOR -XR) 37.5 MG 24 hr capsule TAKE 1 CAPSULE BY MOUTH EVERY DAY   Vitamin D , Ergocalciferol , (DRISDOL ) 1.25 MG (50000 UNIT) CAPS capsule TAKE 1 CAPSULE BY MOUTH EVERY SEVEN DAYS   [DISCONTINUED] acebutolol  (SECTRAL ) 200 MG capsule Take 1 capsule (200 mg total) by mouth 2 (two) times daily.   [DISCONTINUED] diltiazem  (CARDIZEM  CD) 120 MG 24 hr capsule TAKE 1 CAPSULE BY MOUTH EVERY DAY      EKGs/Labs/Other Studies Reviewed:    The following studies were reviewed today:  Cardiac Studies & Procedures   ______________________________________________________________________________________________     ECHOCARDIOGRAM  ECHOCARDIOGRAM COMPLETE 11/15/2022  Narrative ECHOCARDIOGRAM REPORT    Patient Name:   Brittany Short Date of Exam: 11/15/2022 Medical Rec #:  [id number]      Height:       60.0 in Accession #:    [id number]     Weight:       169.4 lb Date of Birth:  Mar 19, 1956       BSA:          1.739 m Patient Age:    67 years       BP:           124/64 mmHg Patient Gender: F              HR:           68 bpm. Exam Location:  [place]  Procedure: 2D Echo, Cardiac Doppler, Color Doppler and Strain Analysis  Indications:    SOB (shortness of breath) [R06.02  (ICD-10-CM)]  History:        Patient has prior history of Echocardiogram examinations, most recent 09/10/2020. Arrythmias:NSVT (nonsustained ventricular tachycardia), Signs/Symptoms:Chest Pain; Risk Factors:Dyslipidemia and Hypertension.  Sonographer:    Lynwood Silvas RDCS Referring Phys: [id number] KARDIE TOBB  IMPRESSIONS   1. GS -13.1. Left ventricular ejection fraction, by estimation, is 60 to 65%. The left ventricle has normal function. The left ventricle has no regional wall motion abnormalities. Left ventricular diastolic parameters are consistent with Grade II diastolic dysfunction (pseudonormalization). 2. Right ventricular systolic function is normal. The right ventricular size is normal. There is normal pulmonary artery systolic pressure. 3. The mitral valve is normal in structure. Mild mitral valve regurgitation. No evidence of mitral stenosis. 4. The aortic valve is normal in structure.  Aortic valve regurgitation is not visualized. No aortic stenosis is present. 5. The inferior vena cava is normal in size with greater than 50% respiratory variability, suggesting right atrial pressure of 3 mmHg.  FINDINGS Left Ventricle: GS -13.1. Left ventricular ejection fraction, by estimation, is 60 to 65%. The left ventricle has normal function. The left ventricle has no regional wall motion abnormalities. The left ventricular internal cavity size was normal in size. There is no left ventricular hypertrophy. Left ventricular diastolic parameters are consistent with Grade II diastolic dysfunction (pseudonormalization).  Right Ventricle: The right ventricular size is normal. No increase in right ventricular wall thickness. Right ventricular systolic function is normal. There is normal pulmonary artery systolic pressure. The tricuspid regurgitant velocity is 2.11 m/s, and with an assumed right atrial pressure of 3 mmHg, the estimated right ventricular systolic pressure is 20.8 mmHg.  Left Atrium:  Left atrial size was normal in size.  Right Atrium: Right atrial size was normal in size.  Pericardium: There is no evidence of pericardial effusion.  Mitral Valve: The mitral valve is normal in structure. Mild mitral valve regurgitation. No evidence of mitral valve stenosis.  Tricuspid Valve: The tricuspid valve is normal in structure. Tricuspid valve regurgitation is trivial. No evidence of tricuspid stenosis.  Aortic Valve: The aortic valve is normal in structure. Aortic valve regurgitation is not visualized. No aortic stenosis is present.  Pulmonic Valve: The pulmonic valve was normal in structure. Pulmonic valve regurgitation is not visualized. No evidence of pulmonic stenosis.  Aorta: The aortic root is normal in size and structure.  Venous: The inferior vena cava is normal in size with greater than 50% respiratory variability, suggesting right atrial pressure of 3 mmHg.  IAS/Shunts: No atrial level shunt detected by color flow Doppler.   LEFT VENTRICLE PLAX 2D LVIDd:         4.10 cm   Diastology LVIDs:         2.90 cm   LV e' medial:    6.31 cm/s LV PW:         1.05 cm   LV E/e' medial:  15.4 LV IVS:        1.10 cm   LV e' lateral:   6.74 cm/s LVOT diam:     2.00 cm   LV E/e' lateral: 14.4 LV SV:         75 LV SV Index:   43 LVOT Area:     3.14 cm   RIGHT VENTRICLE             IVC RV S prime:     14.10 cm/s  IVC diam: 1.50 cm TAPSE (M-mode): 2.8 cm  LEFT ATRIUM             Index        RIGHT ATRIUM           Index LA diam:        4.10 cm 2.36 cm/m   RA Area:     10.60 cm LA Vol (A2C):   53.6 ml 30.81 ml/m  RA Volume:   19.50 ml  11.21 ml/m LA Vol (A4C):   46.3 ml 26.62 ml/m LA Biplane Vol: 50.7 ml 29.15 ml/m AORTIC VALVE LVOT Vmax:   106.00 cm/s LVOT Vmean:  72.900 cm/s LVOT VTI:    0.238 m  AORTA Ao Root diam: 2.80 cm Ao Asc diam:  3.20 cm Ao Desc diam: 2.10 cm  MITRAL VALVE  TRICUSPID VALVE MV Area (PHT): 4.15 cm    TR Peak grad:    17.8 mmHg MV Decel Time: 183 msec    TR Vmax:        211.00 cm/s MV E velocity: 97.30 cm/s MV A velocity: 63.80 cm/s  SHUNTS MV E/A ratio:  1.53        Systemic VTI:  0.24 m Systemic Diam: 2.00 cm  Lamar Fitch MD Electronically signed by Lamar Fitch MD Signature Date/Time: 11/15/2022/3:11:14 PM    Final    MONITORS  LONG TERM MONITOR (3-14 DAYS) 12/06/2022  Narrative Patch Wear Time:  12 days and 5 hours (2024-01-19T14:39:14-0500 to 2024-01-31T20:31:31-0500)  Patient had a min HR of 51 bpm, max HR of 203 bpm, and avg HR of 72 bpm. Predominant underlying rhythm was Sinus Rhythm.  1 run of Ventricular Tachycardia occurred lasting 5 beats with a max rate of 203 bpm (avg 199 bpm). 3 Supraventricular Tachycardia runs occurred, the run with the fastest interval lasting 5 beats with a max rate of 167 bpm, the longest lasting 12.8 secs with an avg rate of 118 bpm. Some episodes of Supraventricular Tachycardia may be possible Atrial Tachycardia with variable block.  Supraventricular Tachycardia was detected within +/- 45 seconds of symptomatic patient event(s). Isolated SVEs were rare (<1.0%), SVE Couplets were rare (<1.0%), and SVE Triplets were rare (<1.0%). Isolated VEs were rare (<1.0%), and no VE Couplets or VE Triplets were present. Ventricular Bigeminy and Trigeminy were present.  Symptoms associated with supraventricular tachycardia.  Conclusion: This study is remarkable for the following: 1.  Nonsustained ventricular tachycardia 2.  Symptomatic paroxysmal supraventricular tachycardia.   CT SCANS  CT CORONARY MORPH W/CTA COR W/SCORE 12/01/2023  Addendum 12/14/2023 12:47 PM ADDENDUM REPORT: 12/14/2023 12:44  EXAM: OVER-READ INTERPRETATION  CT CHEST  The following report is an over-read performed by radiologist Dr. Fonda Mom [facility] on 12/14/2023. This over-read does not include interpretation of cardiac or coronary anatomy or pathology. The  coronary CTA interpretation by the cardiologist is attached.  COMPARISON:  11/10/2023.  FINDINGS: Cardiovascular:  See findings discussed in the body of the report.  Mediastinum/Nodes: No suspicious adenopathy identified. Imaged mediastinal structures are unremarkable.  Lungs/Pleura: There is dependent basilar subsegmental atelectasis. No pneumonia or pulmonary edema. No pleural effusion or pneumothorax.  Upper Abdomen: No acute abnormality.  Musculoskeletal: No chest wall abnormality. No acute osseous findings.  IMPRESSION: No acute extracardiac incidental findings.   Electronically Signed By: Fonda Field M.D. On: 12/14/2023 12:44  Narrative CLINICAL DATA:  68 yo female with chest pain  EXAM: Cardiac/Coronary CTA  TECHNIQUE: A non-contrast, gated CT scan was obtained with axial slices of 3 mm through the heart for calcium  scoring. Calcium  scoring was performed using the Agatston method. A 120 kV prospective, gated, contrast cardiac scan was obtained. Gantry rotation speed was 250 msecs and collimation was 0.6 mm. Two sublingual nitroglycerin  tablets (0.8 mg) were given. The 3D data set was reconstructed in 5% intervals of the 35-75% of the R-R cycle. Diastolic phases were analyzed on a dedicated workstation using MPR, MIP, and VRT modes. The patient received 95 cc of contrast.  FINDINGS: Image quality: Average.  Noise artifact is: Limited.  Coronary Arteries:  Normal coronary origin.  Right dominance.  Left main: The left main is a large caliber vessel with a normal take off from the left coronary cusp that bifurcates to form a left anterior descending artery and a left circumflex artery. There is no plaque or  stenosis.  Left anterior descending artery: The LAD is patent without evidence of plaque or stenosis (mid vessel not well visualized). The LAD gives off 2 patent diagonal branches.  Left circumflex artery: The LCX is non-dominant and patent  with no evidence of plaque or stenosis. The LCX gives off 2 patent obtuse marginal branches. Note distal Lcx and OM2 not well visualized.  Right coronary artery: The RCA is dominant with normal take off from the right coronary cusp. There is no evidence of plaque or stenosis. The RCA terminates as a PDA and right posterolateral branch without evidence of plaque or stenosis.  Right Atrium: Right atrial size is within normal limits.  Right Ventricle: The right ventricular cavity is within normal limits.  Left Atrium: Left atrial size is normal in size with no left atrial appendage filling defect.  Left Ventricle: The ventricular cavity size is within normal limits. There are no stigmata of prior infarction. There is no abnormal filling defect.  Pulmonary arteries: Normal in size without proximal filling defect.  Pulmonary veins: Normal pulmonary venous drainage.  Pericardium: Normal thickness with no significant effusion or calcium  present.  Cardiac valves: The aortic valve is trileaflet without significant calcification. The mitral valve is normal structure without significant calcification.  Aorta: Normal caliber with aortic atherosclerosis.  Extra-cardiac findings: See attached radiology report for non-cardiac structures.  IMPRESSION: 1. Coronary calcium  score of 0. This was 0 percentile for age-, sex, and race-matched controls.  2. Normal coronary origin with right dominance.  3. Normal coronary arteries (note mid LAD and distal Lcx not well visualized).  4. Total plaque volume 0 mm3 which is 0 percentile for age- and sex-matched controls (calcified plaque 0 mm3; non-calcified plaque 0 mm3). TPV is 0.  5. Aortic atherosclerosis.  RECOMMENDATIONS: 1. CAD-RADS 0: No evidence of CAD (0%). Consider non-atherosclerotic causes of chest pain.  Brittany Shallow, MD  Electronically Signed: By: Brittany Shallow M.D. On: 12/01/2023 18:29   CT SCANS  CT CORONARY  FRACTIONAL FLOW RESERVE DATA PREP 10/25/2020  Narrative EXAM: CT-FFR ANALYSIS  CLINICAL DATA:  Analysis for mid-distal LAD  FINDINGS: CT-FFR analysis was performed on the original cardiac CT angiogram dataset. Diagrammatic representation of the CT-FFR analysis is provided in a separate PDF document in PACS. This dictation was created using the PDF document and an interactive 3D model of the results. 3D model is not available in the EMR/PACS. Normal FFR range is >0.80.  1. Left Main: No significant functional stenosis, CT-FFR 0.98 distal vessel.  2. LAD: No significant functional stenosis, CT-FFR 0.94 at mid-distal vessel. 3. LCX: No significant functional stenosis, CT-FFR 0.95 mid vessel. 4. RCA: No significant functional stenosis, CT-FFR 0.96 distal vessel.  IMPRESSION: 1. CT FFR analysis shows no evidence of significant functional stenosis.  Stanly Leavens MD   Electronically Signed By: Stanly Leavens MD On: 10/26/2020 08:04   CT CORONARY MORPH W/CTA COR W/SCORE 10/25/2020  Addendum 10/26/2020  8:03 AM ADDENDUM REPORT: 10/26/2020 08:01  CLINICAL DATA:  68 Year old White Female  EXAM: Cardiac/Coronary  CTA  TECHNIQUE: The patient was scanned on a [employer].  FINDINGS: A 100 kV prospective scan was triggered in the descending thoracic aorta at 111 HU's. Axial non-contrast 3 mm slices were carried out through the heart. The data set was analyzed on a dedicated work station and scored using the Agatson method. Gantry rotation speed was 250 msecs and collimation was .6 mm. No beta blockade and 0.8 mg of sl NTG  was given. The 3D data set was reconstructed in 5% intervals of the 67-82 % of the R-R cycle. Diastolic phases were analyzed on a dedicated work station using MPR, MIP and VRT modes. The patient received 80 cc of contrast.  Aorta:  Normal size.  Aortic atherosclerosis noted.  No dissection.  Aortic Valve:  Tri-leaflet.  No  calcifications.  Coronary Arteries:  Normal coronary origin.  Right dominance.  Coronary calcium  score of 0. This was 1st percentile for age, sex, and race matched control.  RCA is a large dominant artery that gives rise to PDA and PLA. There is no plaque.  Left main is a large artery that gives rise to LAD and LCX arteries.  LAD is a large vessel that gives rise to one large D1 branch.  LCX is a non-dominant artery that gives rise to one large OM1 branch. There is no plaque.  Other findings:  Normal pulmonary vein drainage into the left atrium.  Incidental left atrial diverticulum noted.  Small calcification noted between aorta and left atrium.  Normal left atrial appendage without a thrombus.  Normal size of the pulmonary artery.  Extra-cardiac findings: See attached radiology report for non-cardiac structures.  IMPRESSION: 1. Coronary calcium  score of 0. This was 1st percentile for age, sex, and race matched control.  2. Normal coronary origin.  Right dominance.  3. There is a disconnect between the mid and distal LAD; this may represent step-wise artifact, but sending for CT-FFR to confirm.  4. CAD-RADS 0.  No evidence of CAD.   Electronically Signed By: Stanly Leavens MD On: 10/26/2020 08:01  Narrative EXAM: OVER-READ INTERPRETATION  CT CHEST  The following report is an over-read performed by radiologist Dr. Toribio Aye of [facility] on 10/25/2020. This over-read does not include interpretation of cardiac or coronary anatomy or pathology. The coronary calcium  score/coronary CTA interpretation by the cardiologist is attached.  COMPARISON:  None.  FINDINGS: Aortic atherosclerosis. Within the visualized portions of the thorax there are no suspicious appearing pulmonary nodules or masses, there is no acute consolidative airspace disease, no pleural effusions, no pneumothorax and no lymphadenopathy. Visualized portions of  the upper abdomen are unremarkable. There are no aggressive appearing lytic or blastic lesions noted in the visualized portions of the skeleton.  IMPRESSION: 1.  Aortic Atherosclerosis (ICD10-I70.0).  Electronically Signed: By: Toribio Aye M.D. On: 10/25/2020 09:55     ______________________________________________________________________________________________          Recent Labs: 04/07/2024: ALT 17; BUN 13; Creatinine, Ser 0.77; Hemoglobin 14.1; Platelets 231; Potassium 4.1; Sodium 141 05/17/2024: TSH 3.170  Recent Lipid Panel    Component Value Date/Time   CHOL 205 (H) 04/07/2024 0839   TRIG 134 04/07/2024 0839   HDL 49 04/07/2024 0839   CHOLHDL 4.2 04/07/2024 0839   LDLCALC 132 (H) 04/07/2024 0839    Physical Exam:    VS:  BP (!) 124/48   Pulse 60   Ht 5' (1.524 m)   Wt 160 lb 9.6 oz (72.8 kg)   LMP  (LMP Unknown)   SpO2 96%   BMI 31.37 kg/m     Wt Readings from Last 3 Encounters:  07/29/24 160 lb 9.6 oz (72.8 kg)  07/28/24 159 lb 9.6 oz (72.4 kg)  07/15/24 161 lb 3.2 oz (73.1 kg)     GEN:  Well nourished, well developed in no acute distress HEENT: Normal NECK: No JVD; No carotid bruits LYMPHATICS: No lymphadenopathy CARDIAC: RRR, no murmurs, rubs,  gallops RESPIRATORY:  Clear to auscultation without rales, wheezing or rhonchi  ABDOMEN: Soft, non-tender, non-distended MUSCULOSKELETAL:  No edema; No deformity  SKIN: Warm and dry NEUROLOGIC:  Alert and oriented x 3 PSYCHIATRIC:  Normal affect    Signed, Brittany Leiter, MD  07/29/2024 4:04 PM     [facility]

## 2024-07-29 NOTE — Patient Instructions (Signed)
 Medication Instructions:  Your physician recommends that you continue on your current medications as directed. Please refer to the Current Medication list given to you today.  *If you need a refill on your cardiac medications before your next appointment, please call your pharmacy*  Lab Work: None If you have labs (blood work) drawn today and your tests are completely normal, you will receive your results only by: MyChart Message (if you have MyChart) OR A paper copy in the mail If you have any lab test that is abnormal or we need to change your treatment, we will call you to review the results.  Testing/Procedures: None  Follow-Up: At [facility], you and your health needs are our priority.  As part of our continuing mission to provide you with exceptional heart care, our providers are all part of one team.  This team includes your primary Cardiologist (physician) and Advanced Practice Providers or APPs (Physician Assistants and Nurse Practitioners) who all work together to provide you with the care you need, when you need it.  Your next appointment:   Follow up as needed  Provider:   Redell Leiter, MD    We recommend signing up for the patient portal called MyChart.  Sign up information is provided on this After Visit Summary.  MyChart is used to connect with patients for Virtual Visits (Telemedicine).  Patients are able to view lab/test results, encounter notes, upcoming appointments, etc.  Non-urgent messages can be sent to your provider as well.   To learn more about what you can do with MyChart, go to [email or web address].   Other Instructions None

## 2024-07-30 ENCOUNTER — Ambulatory Visit: Payer: Self-pay | Admitting: Family Medicine

## 2024-07-30 LAB — URINE CULTURE

## 2024-08-02 NOTE — Progress Notes (Signed)
   08/02/2024  [name], female   DOB: 1956-08-12, 68 y.o.   MRN: [id number]  Pharmacy Quality Measure Review  This patient is appearing on a report for being at risk of failing the adherence measure for cholesterol (statin) medications this calendar year.   Medication: atorvastatin  10mg  Last fill date: 9/30 for 90 day supply  Insurance report was not up to date. No action needed at this time.   Lang Sieve, PharmD, BCGP Clinical Pharmacist  [phone]

## 2024-08-19 ENCOUNTER — Ambulatory Visit (INDEPENDENT_AMBULATORY_CARE_PROVIDER_SITE_OTHER)

## 2024-08-19 DIAGNOSIS — Z23 Encounter for immunization: Secondary | ICD-10-CM | POA: Diagnosis not present

## 2024-08-19 NOTE — Progress Notes (Signed)
 Patient is in office today for a nurse visit for Immunization. Patient Injection was given in the  Left deltoid. Patient tolerated injection well.

## 2024-10-07 ENCOUNTER — Ambulatory Visit: Admitting: Physician Assistant

## 2024-10-07 ENCOUNTER — Encounter: Payer: Self-pay | Admitting: Physician Assistant

## 2024-10-07 VITALS — BP 120/58 | HR 62 | Temp 98.2°F | Resp 18 | Ht 60.0 in | Wt 164.0 lb

## 2024-10-07 DIAGNOSIS — K219 Gastro-esophageal reflux disease without esophagitis: Secondary | ICD-10-CM | POA: Diagnosis not present

## 2024-10-07 DIAGNOSIS — E782 Mixed hyperlipidemia: Secondary | ICD-10-CM

## 2024-10-07 DIAGNOSIS — R7303 Prediabetes: Secondary | ICD-10-CM | POA: Diagnosis not present

## 2024-10-07 DIAGNOSIS — M2550 Pain in unspecified joint: Secondary | ICD-10-CM | POA: Diagnosis not present

## 2024-10-07 DIAGNOSIS — E559 Vitamin D deficiency, unspecified: Secondary | ICD-10-CM | POA: Diagnosis not present

## 2024-10-07 DIAGNOSIS — F321 Major depressive disorder, single episode, moderate: Secondary | ICD-10-CM | POA: Diagnosis not present

## 2024-10-07 DIAGNOSIS — I4729 Other ventricular tachycardia: Secondary | ICD-10-CM

## 2024-10-07 DIAGNOSIS — N342 Other urethritis: Secondary | ICD-10-CM | POA: Diagnosis not present

## 2024-10-07 DIAGNOSIS — J4520 Mild intermittent asthma, uncomplicated: Secondary | ICD-10-CM | POA: Diagnosis not present

## 2024-10-07 LAB — POCT URINALYSIS DIP (CLINITEK)
Bilirubin, UA: NEGATIVE
Blood, UA: NEGATIVE
Glucose, UA: NEGATIVE mg/dL
Ketones, POC UA: NEGATIVE mg/dL
Leukocytes, UA: NEGATIVE
Nitrite, UA: NEGATIVE
POC PROTEIN,UA: NEGATIVE
Spec Grav, UA: 1.01 (ref 1.010–1.025)
Urobilinogen, UA: 0.2 U/dL
pH, UA: 7 (ref 5.0–8.0)

## 2024-10-07 LAB — URIC ACID: Uric Acid: 4.5 mg/dL (ref 3.0–7.2)

## 2024-10-07 MED ORDER — NITROFURANTOIN MONOHYD MACRO 100 MG PO CAPS: 100.0000 mg | ORAL_CAPSULE | Freq: Two times a day (BID) | ORAL | 0 refills | Status: DC

## 2024-10-07 MED ORDER — PREDNISONE 20 MG PO TABS: 20.0000 mg | ORAL_TABLET | Freq: Two times a day (BID) | ORAL | 0 refills | Status: DC

## 2024-10-07 NOTE — Progress Notes (Signed)
 Established Patient Office Visit  Subjective:  [name], female    DOB: [birth year]  Age: 68 y.o. MRN: [id number]  CC:  Chief Complaint  Patient presents with   Medical Management of Chronic Issues    HPI Brittany Short presents for chronic follow up  Pt is now seeing cardiology and diagnosed with PAT - currently on acebutolol  200mg  and cardizem  CD 120mg -  and that is controlling symptoms well - she follows with Dr Monetta regularly and last appt 2 months ago  Pt with  anxiety - symptoms are stable on effexor  xr 37.5mg  and voices no concerns or problems-  Pt with GERD - symptoms stable on protonix  40mg  qd and pepcid  40mg   Pt with vit D def - is taking weekly supplement - due to repeat labwork  Pt with asthma/copd - currently taking symbicort, singulair , albuterol  - voices no concerns or problems today  Pt with hyperlipidemia - currently on lipitor 10mg  qd - due for labwork  Pt has intermittent nocturia - would like ua checked - states in past few days also dysuria and urgency - denies hematuria  Pt complains of left 5th finger with joint pain and swelling - has redness in PIP joint and she states that happens occasionally  Past Medical History:  Diagnosis Date   Acid reflux    Acute laryngopharyngitis 10/27/2023   Angular cheilitis 10/01/2022   Aortic valve stenosis 01/08/2022   Atypical chest pain 10/27/2023   Bronchitis 09/17/2023   Chest pain of uncertain etiology 10/02/2020   Chronic cough 10/27/2023   Chronic frontal sinusitis 01/04/2020   Colon cancer screening 08/02/2020   Encounter for screening mammogram for breast cancer 02/15/2021   GERD (gastroesophageal reflux disease)    Major depressive disorder, single episode, moderate (HCC)    Mild intermittent asthma with (acute) exacerbation    Mild intermittent asthma with acute exacerbation 03/08/2023   Mild intermittent asthma without complication 10/01/2022   Mixed hyperlipidemia 10/01/2022    Morbid obesity (HCC) 10/02/2020   Need for prophylactic vaccination against Streptococcus pneumoniae (pneumococcus) 08/02/2020   Needs flu shot 08/02/2020   Nontoxic single thyroid  nodule    NSVT (nonsustained ventricular tachycardia) (HCC) 10/02/2020   Palpitations 08/02/2020   PAT (paroxysmal atrial tachycardia) 10/02/2020   Primary hypertension 01/08/2022   Shortness of breath 10/27/2023   Tremor 02/15/2021   Vitamin D  deficiency 10/01/2022    Past Surgical History:  Procedure Laterality Date   CESAREAN SECTION  1982 & 1998   X2   MENISCUS REPAIR Right 2019   THYROIDECTOMY Right     Family History  Problem Relation Age of Onset   Lung cancer Mother    COPD Mother    Heart attack Father    Depression Sister    Suicidality Sister    Lung cancer Brother    COPD Brother    Lung cancer Brother    Breast cancer Neg Hx     Social History   Socioeconomic History   Marital status: Married    Spouse name: Dick   Number of children: 2   Years of education: Not on file   Highest education level: 12th grade  Occupational History   Not on file  Tobacco Use   Smoking status: Never    Passive exposure: Past   Smokeless tobacco: Never  Vaping Use   Vaping status: Never Used  Substance and Sexual Activity   Alcohol use: Not Currently   Drug use: Never   Sexual activity:  Yes    Partners: Male    Birth control/protection: None  Other Topics Concern   Not on file  Social History Narrative   Not on file   Social Drivers of Health   Tobacco Use: Low Risk (10/07/2024)   Patient History    Smoking Tobacco Use: Never    Smokeless Tobacco Use: Never    Passive Exposure: Past  Financial Resource Strain: Low Risk (10/06/2024)   Overall Financial Resource Strain (CARDIA)    Difficulty of Paying Living Expenses: Not very hard  Food Insecurity: No Food Insecurity (10/06/2024)   Epic    Worried About Programme Researcher, Broadcasting/film/video in the Last Year: Never true    Ran Out of Food in  the Last Year: Never true  Transportation Needs: No Transportation Needs (10/06/2024)   Epic    Lack of Transportation (Medical): No    Lack of Transportation (Non-Medical): No  Physical Activity: Insufficiently Active (10/06/2024)   Exercise Vital Sign    Days of Exercise per Week: 4 days    Minutes of Exercise per Session: 30 min  Stress: Stress Concern Present (10/06/2024)   [employer] of Occupational Health - Occupational Stress Questionnaire    Feeling of Stress: To some extent  Social Connections: Socially Integrated (10/06/2024)   Social Connection and Isolation Panel    Frequency of Communication with Friends and Family: More than three times a week    Frequency of Social Gatherings with Friends and Family: More than three times a week    Attends Religious Services: More than 4 times per year    Active Member of Clubs or Organizations: Yes    Attends Banker Meetings: More than 4 times per year    Marital Status: Married  Catering Manager Violence: Not At Risk (04/29/2024)   Epic    Fear of Current or Ex-Partner: No    Emotionally Abused: No    Physically Abused: No    Sexually Abused: No  Depression (PHQ2-9): Low Risk (10/07/2024)   Depression (PHQ2-9)    PHQ-2 Score: 0  Alcohol Screen: Low Risk (10/06/2024)   Alcohol Screen    Last Alcohol Screening Score (AUDIT): 0  Housing: Low Risk (10/06/2024)   Epic    Unable to Pay for Housing in the Last Year: No    Number of Times Moved in the Last Year: 0    Homeless in the Last Year: No  Utilities: Not At Risk (04/29/2024)   Epic    Threatened with loss of utilities: No  Health Literacy: Adequate Health Literacy (04/29/2024)   B1300 Health Literacy    Frequency of need for help with medical instructions: Never     Current Outpatient Medications:    acebutolol  (SECTRAL ) 200 MG capsule, Take 1 capsule (200 mg total) by mouth 2 (two) times daily., Disp: 180 capsule, Rfl: 3   albuterol  (PROVENTIL ) (2.5  MG/3ML) 0.083% nebulizer solution, INHALE ONE VIAL VIA NEBULIZER EVERY 6 HOURS AS NEEDED FOR WHEEZING AND SHORTNESS OF BREATH, Disp: 75 mL, Rfl: 3   albuterol  (VENTOLIN  HFA) 108 (90 Base) MCG/ACT inhaler, Inhale 2 puffs into the lungs every 4 (four) hours as needed for wheezing or shortness of breath., Disp: 18 g, Rfl: 2   atorvastatin  (LIPITOR) 10 MG tablet, Take 1 tablet (10 mg total) by mouth daily., Disp: 90 tablet, Rfl: 0   budesonide-formoterol (SYMBICORT) 80-4.5 MCG/ACT inhaler, Inhale 2 puffs into the lungs 2 (two) times daily., Disp: , Rfl:    diltiazem  (  CARDIZEM  CD) 120 MG 24 hr capsule, Take 1 capsule (120 mg total) by mouth daily., Disp: 90 capsule, Rfl: 3   famotidine  (PEPCID ) 40 MG tablet, Take 1 tablet (40 mg total) by mouth at bedtime. (Patient taking differently: Take 40 mg by mouth at bedtime. Takes if needed.), Disp: 30 tablet, Rfl: 5   ketoconazole  (NIZORAL ) 2 % cream, Apply 1 Application topically daily. (Patient taking differently: Apply 1 Application topically 2 (two) times daily as needed.), Disp: 15 g, Rfl: 0   montelukast  (SINGULAIR ) 10 MG tablet, TAKE ONE TABLET BY MOUTH EVERY DAY, Disp: 90 tablet, Rfl: 1   nitrofurantoin , macrocrystal-monohydrate, (MACROBID ) 100 MG capsule, Take 1 capsule (100 mg total) by mouth 2 (two) times daily., Disp: 20 capsule, Rfl: 0   pantoprazole  (PROTONIX ) 40 MG tablet, Take one tablet one to two times daily as directed., Disp: 180 tablet, Rfl: 1   predniSONE  (DELTASONE ) 20 MG tablet, Take 1 tablet (20 mg total) by mouth 2 (two) times daily with a meal., Disp: 10 tablet, Rfl: 0   venlafaxine  XR (EFFEXOR -XR) 37.5 MG 24 hr capsule, TAKE 1 CAPSULE BY MOUTH EVERY DAY, Disp: 90 capsule, Rfl: 1   Vitamin D , Ergocalciferol , (DRISDOL ) 1.25 MG (50000 UNIT) CAPS capsule, TAKE 1 CAPSULE BY MOUTH EVERY SEVEN DAYS, Disp: 12 capsule, Rfl: 1   No Known Allergies CONSTITUTIONAL: Negative for chills, fatigue, fever, unintentional weight gain and unintentional  weight loss.  E/N/T: Negative for ear pain, nasal congestion and sore throat.  CARDIOVASCULAR: Negative for chest pain, dizziness, palpitations and pedal edema.  RESPIRATORY: Negative for recent cough and dyspnea.  GASTROINTESTINAL: Negative for abdominal pain, acid reflux symptoms, constipation, diarrhea, nausea and vomiting.  GU - see HPI MSK: see HPI INTEGUMENTARY: Negative for rash.  NEUROLOGICAL: Negative for dizziness and headaches.  PSYCHIATRIC: Negative for sleep disturbance and to question depression screen.  Negative for depression, negative for anhedonia.       Objective:  PHYSICAL EXAM:   VS: BP (!) 120/58   Pulse 62   Temp 98.2 F (36.8 C) (Temporal)   Resp 18   Ht 5' (1.524 m)   Wt 164 lb (74.4 kg)   LMP  (LMP Unknown)   SpO2 96%   BMI 32.03 kg/m   GEN: Well nourished, well developed, in no acute distress   Cardiac: RRR; no murmurs, rubs, or gallops,no edema -  Respiratory:  normal respiratory rate and pattern with no distress - normal breath sounds with no rales, rhonchi, wheezes or rubs GI: normal bowel sounds, no masses or tenderness MS: right 5th finger with slight redness of joint Skin: warm and dry, no rash  Neuro:  Alert and Oriented x 3, - CN II-Xii grossly intact Psych: euthymic mood, appropriate affect and demeanor  Office Visit on 10/07/2024  Component Date Value Ref Range Status   Color, UA 10/07/2024 yellow  yellow Final   Clarity, UA 10/07/2024 clear  clear Final   Glucose, UA 10/07/2024 negative  negative mg/dL Final   Bilirubin, UA 87/88/7974 negative  negative Final   Ketones, POC UA 10/07/2024 negative  negative mg/dL Final   Spec Grav, UA 87/88/7974 1.010  1.010 - 1.025 Final   Blood, UA 10/07/2024 negative  negative Final   pH, UA 10/07/2024 7.0  5.0 - 8.0 Final   POC PROTEIN,UA 10/07/2024 negative  negative, trace Final   Urobilinogen, UA 10/07/2024 0.2  0.2 or 1.0 E.U./dL Final   Nitrite, UA 87/88/7974 Negative  Negative Final    Leukocytes,  UA 10/07/2024 Negative  Negative Final    There are no preventive care reminders to display for this patient.   There are no preventive care reminders to display for this patient.  Lab Results  Component Value Date   TSH 3.170 05/17/2024   Lab Results  Component Value Date   WBC 6.4 04/07/2024   HGB 14.1 04/07/2024   HCT 43.8 04/07/2024   MCV 93 04/07/2024   PLT 231 04/07/2024   Lab Results  Component Value Date   NA 141 04/07/2024   K 4.1 04/07/2024   CO2 20 04/07/2024   GLUCOSE 105 (H) 04/07/2024   BUN 13 04/07/2024   CREATININE 0.77 04/07/2024   BILITOT 0.4 04/07/2024   ALKPHOS 84 04/07/2024   AST 18 04/07/2024   ALT 17 04/07/2024   PROT 6.5 04/07/2024   ALBUMIN 4.3 04/07/2024   CALCIUM  9.6 04/07/2024   EGFR 83 04/07/2024   Lab Results  Component Value Date   CHOL 205 (H) 04/07/2024   Lab Results  Component Value Date   HDL 49 04/07/2024   Lab Results  Component Value Date   LDLCALC 132 (H) 04/07/2024   Lab Results  Component Value Date   TRIG 134 04/07/2024   Lab Results  Component Value Date   CHOLHDL 4.2 04/07/2024   Lab Results  Component Value Date   HGBA1C 5.7 (H) 04/07/2024      Assessment & Plan:   Problem List Items Addressed This Visit       Cardiovascular and Mediastinum   NSVT (nonsustained ventricular tachycardia) (HCC) - Primary   Relevant Orders   CBC with Differential/Platelet   Comprehensive metabolic panel   Lipid panel TSH Continue current meds and follow up with cardiology as directed      Digestive   GERD (gastroesophageal reflux disease)   Relevant Medications   pantoprazole  (PROTONIX ) 40 MG tablet        Mild intermittent asthma without complication Continue current meds      Hyperglycemia Watch diet Hgb A1c pending      Other   Major depressive disorder, single episode, moderate (HCC)   Relevant Medications   venlafaxine  XR (EFFEXOR -XR) 37.5 MG 24 hr capsule    Vit D def Labwork  pending Continue med  Joint pain Rx prednisone  Uric acid level pending  Urethritis Rx doxycycline  100mg  bid      Hyperlipidemia Continue lipitor Labs pending  Prediabetes Labwork pending Watch diet        Meds ordered this encounter  Medications   predniSONE  (DELTASONE ) 20 MG tablet    Sig: Take 1 tablet (20 mg total) by mouth 2 (two) times daily with a meal.    Dispense:  10 tablet    Refill:  0    Supervising Provider:   COX, KIRSTEN [[id number]]   nitrofurantoin , macrocrystal-monohydrate, (MACROBID ) 100 MG capsule    Sig: Take 1 capsule (100 mg total) by mouth 2 (two) times daily.    Dispense:  20 capsule    Refill:  0    Supervising Provider:   SHERRE CLAPPER [[id number]]   Follow-up: Return in about 6 months (around 04/07/2025) for chronic fasting follow-up.    SARA R Janita Camberos, PA-C

## 2024-10-08 LAB — CBC WITH DIFFERENTIAL/PLATELET
Basophils Absolute: 0 x10E3/uL (ref 0.0–0.2)
Basos: 0 %
EOS (ABSOLUTE): 0.2 x10E3/uL (ref 0.0–0.4)
Eos: 3 %
Hematocrit: 40.2 % (ref 34.0–46.6)
Hemoglobin: 12.9 g/dL (ref 11.1–15.9)
Immature Grans (Abs): 0 x10E3/uL (ref 0.0–0.1)
Immature Granulocytes: 0 %
Lymphocytes Absolute: 2.5 x10E3/uL (ref 0.7–3.1)
Lymphs: 32 %
MCH: 28.6 pg (ref 26.6–33.0)
MCHC: 32.1 g/dL (ref 31.5–35.7)
MCV: 89 fL (ref 79–97)
Monocytes Absolute: 0.5 x10E3/uL (ref 0.1–0.9)
Monocytes: 6 %
Neutrophils Absolute: 4.7 x10E3/uL (ref 1.4–7.0)
Neutrophils: 59 %
Platelets: 241 x10E3/uL (ref 150–450)
RBC: 4.51 x10E6/uL (ref 3.77–5.28)
RDW: 14.4 % (ref 11.7–15.4)
WBC: 7.9 x10E3/uL (ref 3.4–10.8)

## 2024-10-08 LAB — COMPREHENSIVE METABOLIC PANEL WITH GFR
ALT: 18 IU/L (ref 0–32)
AST: 18 IU/L (ref 0–40)
Albumin: 4.3 g/dL (ref 3.9–4.9)
Alkaline Phosphatase: 92 IU/L (ref 49–135)
BUN/Creatinine Ratio: 22 (ref 12–28)
BUN: 19 mg/dL (ref 8–27)
Bilirubin Total: 0.3 mg/dL (ref 0.0–1.2)
CO2: 25 mmol/L (ref 20–29)
Calcium: 9.2 mg/dL (ref 8.7–10.3)
Chloride: 101 mmol/L (ref 96–106)
Creatinine, Ser: 0.85 mg/dL (ref 0.57–1.00)
Globulin, Total: 2.6 g/dL (ref 1.5–4.5)
Glucose: 104 mg/dL — ABNORMAL HIGH (ref 70–99)
Potassium: 4.5 mmol/L (ref 3.5–5.2)
Sodium: 141 mmol/L (ref 134–144)
Total Protein: 6.9 g/dL (ref 6.0–8.5)
eGFR: 74 mL/min/1.73 (ref >59)

## 2024-10-08 LAB — LIPID PANEL
Chol/HDL Ratio: 2.6 ratio (ref 0.0–4.4)
Cholesterol, Total: 174 mg/dL (ref 100–199)
HDL: 67 mg/dL (ref >39)
LDL Chol Calc (NIH): 91 mg/dL (ref 0–99)
Triglycerides: 89 mg/dL (ref 0–149)
VLDL Cholesterol Cal: 16 mg/dL (ref 5–40)

## 2024-10-08 LAB — TSH: TSH: 3.26 u[IU]/mL (ref 0.450–4.500)

## 2024-10-08 LAB — HEMOGLOBIN A1C
Est. average glucose Bld gHb Est-mCnc: 126 mg/dL
Hgb A1c MFr Bld: 6 % — ABNORMAL HIGH (ref 4.8–5.6)

## 2024-10-08 LAB — VITAMIN D 25 HYDROXY (VIT D DEFICIENCY, FRACTURES): Vit D, 25-Hydroxy: 52.2 ng/mL (ref 30.0–100.0)

## 2024-10-12 ENCOUNTER — Ambulatory Visit: Payer: Self-pay | Admitting: Physician Assistant

## 2024-10-22 ENCOUNTER — Other Ambulatory Visit: Payer: Self-pay | Admitting: Physician Assistant

## 2024-11-04 ENCOUNTER — Other Ambulatory Visit: Payer: Self-pay | Admitting: Physician Assistant

## 2024-11-04 DIAGNOSIS — E559 Vitamin D deficiency, unspecified: Secondary | ICD-10-CM

## 2024-11-26 ENCOUNTER — Encounter: Payer: Self-pay | Admitting: Physician Assistant

## 2024-11-26 ENCOUNTER — Other Ambulatory Visit: Payer: Self-pay | Admitting: Physician Assistant

## 2024-11-26 ENCOUNTER — Telehealth: Admitting: Physician Assistant

## 2024-11-26 VITALS — BP 134/65 | Ht 60.0 in | Wt 160.0 lb

## 2024-11-26 DIAGNOSIS — F321 Major depressive disorder, single episode, moderate: Secondary | ICD-10-CM

## 2024-11-26 DIAGNOSIS — J9801 Acute bronchospasm: Secondary | ICD-10-CM | POA: Diagnosis not present

## 2024-11-26 DIAGNOSIS — J060 Acute laryngopharyngitis: Secondary | ICD-10-CM

## 2024-11-26 MED ORDER — DOXYCYCLINE HYCLATE 100 MG PO TABS: 100.0000 mg | ORAL_TABLET | Freq: Two times a day (BID) | ORAL | 0 refills | Status: DC

## 2024-11-26 MED ORDER — PREDNISONE 20 MG PO TABS: ORAL_TABLET | ORAL | 0 refills | Status: AC

## 2024-11-26 MED ORDER — MONTELUKAST SODIUM 10 MG PO TABS: 10.0000 mg | ORAL_TABLET | Freq: Every day | ORAL | 1 refills | Status: AC

## 2024-11-26 MED ORDER — PANTOPRAZOLE SODIUM 40 MG PO TBEC: DELAYED_RELEASE_TABLET | ORAL | 1 refills | Status: AC

## 2024-11-26 MED ORDER — VENLAFAXINE HCL ER 37.5 MG PO CP24: 37.5000 mg | ORAL_CAPSULE | Freq: Every day | ORAL | 1 refills | Status: AC

## 2024-11-26 MED ORDER — DILTIAZEM HCL ER COATED BEADS 120 MG PO CP24: 120.0000 mg | ORAL_CAPSULE | Freq: Every day | ORAL | 1 refills | Status: AC

## 2024-11-26 MED ORDER — ATORVASTATIN CALCIUM 10 MG PO TABS: 10.0000 mg | ORAL_TABLET | Freq: Every day | ORAL | 1 refills | Status: AC

## 2024-11-26 MED ORDER — HYDROCODONE BIT-HOMATROP MBR 5-1.5 MG/5ML PO SOLN: 5.0000 mL | Freq: Four times a day (QID) | ORAL | 0 refills | Status: AC | PRN

## 2024-11-26 NOTE — Progress Notes (Signed)
 "   Virtual Visit via Telephone Note   This visit type was conducted due to national recommendations for restrictions regarding the COVID-19 Pandemic (e.g. social distancing) in an effort to limit this patient's exposure and mitigate transmission in our community.  Due to her co-morbid illnesses, this patient is at least at moderate risk for complications without adequate follow up.  This format is felt to be most appropriate for this patient at this time.  The patient did not have access to video technology/had technical difficulties with video requiring transitioning to audio format only (telephone).  All issues noted in this document were discussed and addressed.  No physical exam could be performed with this format.  Patient verbally consented to a telehealth visit.   Date:  11/26/2024   ID:  Brittany Short, DOB [birth year], MRN [id number]  Patient Location: Home Provider Location: Office  PCP:  Nicholaus Credit, PA-C   Chief Complaint:  cough and congestion  History of Present Illness:    Brittany Short is a 69 y.o. female with complaints of cough and wheezing that started 2 days ago - states that the cough has been nonproductive - she is having to use her inhalers for mild wheezing .  She denies fever.  Has had minimal malaise Has had mild scratchy throat as well  The patient does not have symptoms concerning for COVID-19 infection (fever, chills, cough, or new shortness of breath).    Past Medical History:  Diagnosis Date   Acid reflux    Acute laryngopharyngitis 10/27/2023   Angular cheilitis 10/01/2022   Aortic valve stenosis 01/08/2022   Atypical chest pain 10/27/2023   Bronchitis 09/17/2023   Chest pain of uncertain etiology 10/02/2020   Chronic cough 10/27/2023   Chronic frontal sinusitis 01/04/2020   Colon cancer screening 08/02/2020   Encounter for screening mammogram for breast cancer 02/15/2021   GERD (gastroesophageal reflux disease)    Major depressive disorder, single  episode, moderate (HCC)    Mild intermittent asthma with (acute) exacerbation    Mild intermittent asthma with acute exacerbation 03/08/2023   Mild intermittent asthma without complication 10/01/2022   Mixed hyperlipidemia 10/01/2022   Morbid obesity (HCC) 10/02/2020   Need for prophylactic vaccination against Streptococcus pneumoniae (pneumococcus) 08/02/2020   Needs flu shot 08/02/2020   Nontoxic single thyroid  nodule    NSVT (nonsustained ventricular tachycardia) (HCC) 10/02/2020   Palpitations 08/02/2020   PAT (paroxysmal atrial tachycardia) 10/02/2020   Primary hypertension 01/08/2022   Shortness of breath 10/27/2023   Tremor 02/15/2021   Vitamin D  deficiency 10/01/2022   Past Surgical History:  Procedure Laterality Date   CESAREAN SECTION  1982 & 1998   X2   MENISCUS REPAIR Right 2019   THYROIDECTOMY Right      Active Medications[1]   Allergies:   Patient has no known allergies.   Social History[2]   Family Hx: The patient's family history includes COPD in her brother and mother; Depression in her sister; Heart attack in her father; Lung cancer in her brother, brother, and mother; Suicidality in her sister. There is no history of Breast cancer.  ROS:   Please see the history of present illness.    All other systems reviewed and are negative.  Labs/Other Tests and Data Reviewed:    Recent Labs: 10/07/2024: ALT 18; BUN 19; Creatinine, Ser 0.85; Hemoglobin 12.9; Platelets 241; Potassium 4.5; Sodium 141; TSH 3.260   Recent Lipid Panel Lab Results  Component Value Date/Time   CHOL 174 10/07/2024 08:45 AM  TRIG 89 10/07/2024 08:45 AM   HDL 67 10/07/2024 08:45 AM   CHOLHDL 2.6 10/07/2024 08:45 AM   LDLCALC 91 10/07/2024 08:45 AM    Wt Readings from Last 3 Encounters:  11/26/24 160 lb (72.6 kg)  10/07/24 164 lb (74.4 kg)  07/29/24 160 lb 9.6 oz (72.8 kg)     Objective:    Vital Signs:  BP 134/65   Ht 5' (1.524 m)   Wt 160 lb (72.6 kg)   LMP  (LMP  Unknown)   BMI 31.25 kg/m    VITAL SIGNS:  reviewed GEN:  no acute distress  ASSESSMENT & PLAN:    URI - rx for doxycycline  bid for 10 days and hydromet cough medication Bronchospasm - rx for prednisone  taper to take as directed -- continue inhalers and nebulizer as directed  COVID-19 Education: The signs and symptoms of COVID-19 were discussed with the patient and how to seek care for testing (follow up with PCP or arrange E-visit). The importance of social distancing was discussed today.  Time:   Today, I have spent 10 minutes with the patient with telehealth technology discussing the above problems.     Medication Adjustments/Labs and Tests Ordered: Current medicines are reviewed at length with the patient today.  Concerns regarding medicines are outlined above.   Tests Ordered: No orders of the defined types were placed in this encounter.   Medication Changes: Meds ordered this encounter  Medications   doxycycline  (VIBRA -TABS) 100 MG tablet    Sig: Take 1 tablet (100 mg total) by mouth 2 (two) times daily.    Dispense:  20 tablet    Refill:  0    Supervising Provider:   COX, KIRSTEN [[id number]]   predniSONE  (DELTASONE ) 20 MG tablet    Sig: 1 po tid for 3 days then 1 po bid for 3 days then 1 po qd for 3 days    Dispense:  18 tablet    Refill:  0    Supervising Provider:   COX, KIRSTEN [[id number]]   HYDROcodone  bit-homatropine (HYDROMET) 5-1.5 MG/5ML syrup    Sig: Take 5 mLs by mouth every 6 (six) hours as needed.    Dispense:  120 mL    Refill:  0    Supervising Provider:   SHERRE CLAPPER [id number]    Follow Up:  In Person if symptoms persist or worsen  Signed, SARA R Imre Vecchione, PA-C  11/26/2024 12:08 PM    [facility]      [1]  Current Meds  Medication Sig   acebutolol  (SECTRAL ) 200 MG capsule Take 1 capsule (200 mg total) by mouth 2 (two) times daily.   albuterol  (PROVENTIL ) (2.5 MG/3ML) 0.083% nebulizer solution INHALE ONE VIAL VIA NEBULIZER EVERY 6  HOURS AS NEEDED FOR WHEEZING AND SHORTNESS OF BREATH   albuterol  (VENTOLIN  HFA) 108 (90 Base) MCG/ACT inhaler Inhale 2 puffs into the lungs every 4 (four) hours as needed for wheezing or shortness of breath.   atorvastatin  (LIPITOR) 10 MG tablet TAKE ONE TABLET BY MOUTH EVERY DAY   budesonide-formoterol (SYMBICORT) 80-4.5 MCG/ACT inhaler Inhale 2 puffs into the lungs 2 (two) times daily.   diltiazem  (CARDIZEM  CD) 120 MG 24 hr capsule Take 1 capsule (120 mg total) by mouth daily.   doxycycline  (VIBRA -TABS) 100 MG tablet Take 1 tablet (100 mg total) by mouth 2 (two) times daily.   famotidine  (PEPCID ) 40 MG tablet Take 1 tablet (40 mg total) by mouth at bedtime. (Patient taking differently: Take  40 mg by mouth at bedtime. Takes if needed.)   HYDROcodone  bit-homatropine (HYDROMET) 5-1.5 MG/5ML syrup Take 5 mLs by mouth every 6 (six) hours as needed.   ketoconazole  (NIZORAL ) 2 % cream Apply 1 Application topically daily. (Patient taking differently: Apply 1 Application topically 2 (two) times daily as needed.)   montelukast  (SINGULAIR ) 10 MG tablet TAKE ONE TABLET BY MOUTH EVERY DAY   pantoprazole  (PROTONIX ) 40 MG tablet Take one tablet one to two times daily as directed.   predniSONE  (DELTASONE ) 20 MG tablet 1 po tid for 3 days then 1 po bid for 3 days then 1 po qd for 3 days   venlafaxine  XR (EFFEXOR -XR) 37.5 MG 24 hr capsule TAKE 1 CAPSULE BY MOUTH EVERY DAY   Vitamin D , Ergocalciferol , (DRISDOL ) 1.25 MG (50000 UNIT) CAPS capsule TAKE ONE CAPSULE BY MOUTH EVERY SEVEN DAYS  [2]  Social History Tobacco Use   Smoking status: Never    Passive exposure: Past   Smokeless tobacco: Never  Vaping Use   Vaping status: Never Used  Substance Use Topics   Alcohol use: Not Currently   Drug use: Never   "

## 2024-11-26 NOTE — Telephone Encounter (Signed)
Video visit made today.

## 2024-11-29 ENCOUNTER — Ambulatory Visit: Admitting: Physician Assistant

## 2024-12-02 ENCOUNTER — Ambulatory Visit: Admitting: Physician Assistant

## 2024-12-02 ENCOUNTER — Encounter: Payer: Self-pay | Admitting: Physician Assistant

## 2024-12-02 VITALS — BP 124/62 | HR 55 | Temp 97.2°F | Resp 16 | Ht 60.0 in | Wt 166.6 lb

## 2024-12-02 DIAGNOSIS — J440 Chronic obstructive pulmonary disease with acute lower respiratory infection: Secondary | ICD-10-CM | POA: Diagnosis not present

## 2024-12-02 DIAGNOSIS — J209 Acute bronchitis, unspecified: Secondary | ICD-10-CM | POA: Diagnosis not present

## 2024-12-02 MED ORDER — LEVOFLOXACIN 500 MG PO TABS: 500.0000 mg | ORAL_TABLET | Freq: Every day | ORAL | 0 refills | Status: AC

## 2024-12-02 NOTE — Progress Notes (Signed)
 "  Acute Office Visit  Subjective:    [name], female    DOB: 19-Mar-1956, 69 y.o.   MRN: [id number]  Chief Complaint  Patient presents with   Cough    HPI: Patient is in today for complaints of cough, congestion and sinus drainage.  She is currently taking doxycycline , prednisone  and using her inhalers.  She states she had started feeling better but the cough worsened and now is more productive than last week Has had some intermittent wheezing  Current Medications[1]  Allergies[2]  ROS CONSTITUTIONAL: Negative for chills, fatigue, fever,  E/N/T: see HPI CARDIOVASCULAR: Negative for chest pain, dizziness, palpitations and pedal edema.  RESPIRATORY: see HPI GASTROINTESTINAL: Negative for abdominal pain, acid reflux symptoms, constipation, diarrhea, nausea and vomiting.       Objective:    PHYSICAL EXAM:   BP 124/62   Pulse (!) 55   Temp (!) 97.2 F (36.2 C) (Temporal)   Resp 16   Ht 5' (1.524 m)   Wt 166 lb 9.6 oz (75.6 kg)   LMP  (LMP Unknown)   SpO2 96%   BMI 32.54 kg/m    GEN: Well nourished, well developed, in no acute distress  HEENT: normal external ears and nose - normal external auditory canals and TMS -- Lips, Teeth and Gums - normal  Oropharynx - erythema/pnd Cardiac: RRR; no murmurs,  Respiratory:  scattered rhonchi noted Skin: warm and dry, no rash         Assessment & Plan Acute bronchitis with COPD (HCC) Continue current meds and inhalers as directed Orders:   levofloxacin  (LEVAQUIN ) 500 MG tablet; Take 1 tablet (500 mg total) by mouth daily for 7 days.    Follow-up: Return if symptoms worsen or fail to improve.  An After Visit Summary was printed and given to the patient.  SARA R Victorian Gunn, PA-C [facility] [phone]    [1]  Current Outpatient Medications:    acebutolol  (SECTRAL ) 200 MG capsule, Take 1 capsule (200 mg total) by mouth 2 (two) times daily., Disp: 180 capsule, Rfl: 3   albuterol  (PROVENTIL )  (2.5 MG/3ML) 0.083% nebulizer solution, INHALE ONE VIAL VIA NEBULIZER EVERY 6 HOURS AS NEEDED FOR WHEEZING AND SHORTNESS OF BREATH, Disp: 75 mL, Rfl: 3   albuterol  (VENTOLIN  HFA) 108 (90 Base) MCG/ACT inhaler, Inhale 2 puffs into the lungs every 4 (four) hours as needed for wheezing or shortness of breath., Disp: 18 g, Rfl: 2   atorvastatin  (LIPITOR) 10 MG tablet, Take 1 tablet (10 mg total) by mouth daily., Disp: 90 tablet, Rfl: 1   budesonide-formoterol (SYMBICORT) 80-4.5 MCG/ACT inhaler, Inhale 2 puffs into the lungs 2 (two) times daily., Disp: , Rfl:    diltiazem  (CARDIZEM  CD) 120 MG 24 hr capsule, Take 1 capsule (120 mg total) by mouth daily., Disp: 90 capsule, Rfl: 1   doxycycline  (VIBRA -TABS) 100 MG tablet, Take 100 mg by mouth 2 (two) times daily., Disp: , Rfl:    HYDROcodone  bit-homatropine (HYDROMET) 5-1.5 MG/5ML syrup, Take 5 mLs by mouth every 6 (six) hours as needed., Disp: 120 mL, Rfl: 0   levofloxacin  (LEVAQUIN ) 500 MG tablet, Take 1 tablet (500 mg total) by mouth daily for 7 days., Disp: 7 tablet, Rfl: 0   montelukast  (SINGULAIR ) 10 MG tablet, Take 1 tablet (10 mg total) by mouth daily., Disp: 90 tablet, Rfl: 1   pantoprazole  (PROTONIX ) 40 MG tablet, Take one tablet one to two times daily as directed., Disp: 180 tablet, Rfl: 1  predniSONE  (DELTASONE ) 20 MG tablet, 1 po tid for 3 days then 1 po bid for 3 days then 1 po qd for 3 days, Disp: 18 tablet, Rfl: 0   venlafaxine  XR (EFFEXOR -XR) 37.5 MG 24 hr capsule, Take 1 capsule (37.5 mg total) by mouth daily., Disp: 90 capsule, Rfl: 1   Vitamin D , Ergocalciferol , (DRISDOL ) 1.25 MG (50000 UNIT) CAPS capsule, TAKE ONE CAPSULE BY MOUTH EVERY SEVEN DAYS, Disp: 12 capsule, Rfl: 1   famotidine  (PEPCID ) 40 MG tablet, Take 1 tablet (40 mg total) by mouth at bedtime. (Patient taking differently: Take 40 mg by mouth at bedtime. Takes if needed.), Disp: 30 tablet, Rfl: 5   ketoconazole  (NIZORAL ) 2 % cream, Apply 1 Application topically daily. (Patient  taking differently: Apply 1 Application topically 2 (two) times daily as needed.), Disp: 15 g, Rfl: 0 [2] No Known Allergies  "

## 2025-01-05 ENCOUNTER — Ambulatory Visit: Admitting: Allergy and Immunology

## 2025-04-07 ENCOUNTER — Ambulatory Visit: Admitting: Physician Assistant

## 2025-05-05 ENCOUNTER — Ambulatory Visit
